# Patient Record
Sex: Female | Born: 1955 | Race: White | Hispanic: No | Marital: Single | Smoking: Never smoker
Health system: Southern US, Community
[De-identification: ages and names within clinical notes are randomized; demographics above are authoritative.]

## PROBLEM LIST (undated history)

## (undated) DIAGNOSIS — F71 Moderate intellectual disabilities: Secondary | ICD-10-CM

## (undated) DIAGNOSIS — I639 Cerebral infarction, unspecified: Secondary | ICD-10-CM

## (undated) DIAGNOSIS — Q909 Down syndrome, unspecified: Secondary | ICD-10-CM

## (undated) DIAGNOSIS — F32A Depression, unspecified: Secondary | ICD-10-CM

## (undated) DIAGNOSIS — F0390 Unspecified dementia without behavioral disturbance: Secondary | ICD-10-CM

## (undated) DIAGNOSIS — E039 Hypothyroidism, unspecified: Secondary | ICD-10-CM

## (undated) DIAGNOSIS — T7840XA Allergy, unspecified, initial encounter: Secondary | ICD-10-CM

## (undated) DIAGNOSIS — I951 Orthostatic hypotension: Secondary | ICD-10-CM

## (undated) DIAGNOSIS — F329 Major depressive disorder, single episode, unspecified: Secondary | ICD-10-CM

## (undated) DIAGNOSIS — E785 Hyperlipidemia, unspecified: Secondary | ICD-10-CM

## (undated) DIAGNOSIS — G4730 Sleep apnea, unspecified: Secondary | ICD-10-CM

## (undated) DIAGNOSIS — R443 Hallucinations, unspecified: Secondary | ICD-10-CM

## (undated) DIAGNOSIS — I739 Peripheral vascular disease, unspecified: Secondary | ICD-10-CM

## (undated) HISTORY — DX: Peripheral vascular disease, unspecified: I73.9

## (undated) HISTORY — DX: Depression, unspecified: F32.A

## (undated) HISTORY — DX: Sleep apnea, unspecified: G47.30

## (undated) HISTORY — DX: Allergy, unspecified, initial encounter: T78.40XA

## (undated) HISTORY — DX: Down syndrome, unspecified: Q90.9

## (undated) HISTORY — DX: Hypothyroidism, unspecified: E03.9

## (undated) HISTORY — DX: Major depressive disorder, single episode, unspecified: F32.9

## (undated) HISTORY — DX: Cerebral infarction, unspecified: I63.9

## (undated) HISTORY — PX: TONSILLECTOMY: SUR1361

## (undated) HISTORY — DX: Moderate intellectual disabilities: F71

## (undated) HISTORY — DX: Hallucinations, unspecified: R44.3

## (undated) HISTORY — DX: Hyperlipidemia, unspecified: E78.5

## (undated) HISTORY — PX: OTHER SURGICAL HISTORY: SHX169

---

## 1997-11-02 ENCOUNTER — Other Ambulatory Visit: Admission: RE | Admit: 1997-11-02 | Discharge: 1997-11-02 | Payer: Self-pay | Admitting: *Deleted

## 1999-03-20 ENCOUNTER — Other Ambulatory Visit: Admission: RE | Admit: 1999-03-20 | Discharge: 1999-03-20 | Payer: Self-pay | Admitting: *Deleted

## 2000-04-08 ENCOUNTER — Other Ambulatory Visit: Admission: RE | Admit: 2000-04-08 | Discharge: 2000-04-08 | Payer: Self-pay | Admitting: *Deleted

## 2000-04-22 ENCOUNTER — Encounter: Admission: RE | Admit: 2000-04-22 | Discharge: 2000-04-22 | Payer: Self-pay | Admitting: *Deleted

## 2000-04-22 ENCOUNTER — Encounter: Payer: Self-pay | Admitting: *Deleted

## 2002-04-26 ENCOUNTER — Encounter: Admission: RE | Admit: 2002-04-26 | Discharge: 2002-06-24 | Payer: Self-pay | Admitting: Family Medicine

## 2002-04-28 ENCOUNTER — Encounter: Admission: RE | Admit: 2002-04-28 | Discharge: 2002-04-28 | Payer: Self-pay

## 2002-06-23 ENCOUNTER — Encounter: Payer: Self-pay | Admitting: Cardiovascular Disease

## 2002-06-23 ENCOUNTER — Ambulatory Visit (HOSPITAL_COMMUNITY): Admission: RE | Admit: 2002-06-23 | Discharge: 2002-06-23 | Payer: Self-pay | Admitting: Neurology

## 2003-07-27 ENCOUNTER — Other Ambulatory Visit: Admission: RE | Admit: 2003-07-27 | Discharge: 2003-07-27 | Payer: Self-pay | Admitting: Family Medicine

## 2006-05-16 ENCOUNTER — Encounter: Admission: RE | Admit: 2006-05-16 | Discharge: 2006-05-16 | Payer: Self-pay | Admitting: Family Medicine

## 2007-02-24 ENCOUNTER — Ambulatory Visit (HOSPITAL_BASED_OUTPATIENT_CLINIC_OR_DEPARTMENT_OTHER): Admission: RE | Admit: 2007-02-24 | Discharge: 2007-02-24 | Payer: Self-pay | Admitting: Neurology

## 2007-04-23 ENCOUNTER — Ambulatory Visit: Payer: Self-pay | Admitting: Internal Medicine

## 2007-07-07 ENCOUNTER — Encounter (INDEPENDENT_AMBULATORY_CARE_PROVIDER_SITE_OTHER): Payer: Self-pay | Admitting: Family Medicine

## 2007-07-07 ENCOUNTER — Ambulatory Visit: Payer: Self-pay

## 2010-10-23 NOTE — Assessment & Plan Note (Signed)
[facility]                            CARDIOLOGY OFFICE NOTE   NAME:Ryser, LAKEA MITTELMAN                    MRN:          [id number]  DATE:04/23/2007                            DOB:          Sep 23, 1955    IDENTIFICATION:  Ms. Chenard is a 55 year old woman who was referred by  Dr. Smith Mince for evaluation of a murmur.   HISTORY OF PRESENT ILLNESS:  The patient has a history of Down syndrome.  She was seen recently in clinic in September and question set up for an  echocardiogram was not done.  Note, she has had an echocardiogram in  2004, at the time of a CVA.  This was a difficult study because of the  patient's size.  LV function appeared to be normal.  There did not  appear to any intracardiac shunt.  RV was reported to be normal in size  and function.  There was mild aortic insufficiency.   On talking to the patient and her mother, she takes activities as  tolerated.  She is not too active.  She has some nasal stuffiness but  that has been stable.   CURRENT MEDICATIONS:  1. Synthroid 75 mcg daily.  2. Omega-3 one gram daily.  3. Multivitamin.  4. Aspirin 81 mg daily.   ALLERGIES:  No known drug allergies.   PAST MEDICAL HISTORY:  1. History of Down syndrome.  2. History of CVA, left frontal.  3. DJD.  4. Obesity.  5. Hypothyroidism.  6. Dyslipidemia, had been on Zocor.  Recent lipids off Statins show an      increase in her lipids with many small particles.  7. Very mild sleep apnea.  8. History of allergies.   SOCIAL HISTORY:  The patient lives at home, does not drink, does not  smoke.  Works at [employer].   FAMILY HISTORY:  Noncontributory.   REVIEW OF SYSTEMS:  All systems reviewed negative to the above problem  except as noted above.   PHYSICAL EXAMINATION:  GENERAL:  On exam the patient is in no distress.  VITAL SIGNS:  Blood pressure is 130/85, pulse is 64, weight 231.  HEENT:  Normocephalic, atraumatic.  Wide set eyes.   Mucous membranes  moist.  LUNGS:  Relatively clear.  No rales.  NECK:  No thyromegaly.  JVP is difficult to see.  No bruits.  CARDIAC:  Regular rate and rhythm.  S1 S2.  Grade 1/6 systolic murmur  heard best at the left sternal border that decreases with standing.  No  diastolic murmur is audible.  ABDOMEN:  Supple, obese, no hepatomegaly.  EXTREMITIES:  No lower extremity edema.  Pulses 2 plus.   A 12-lead EKG shows a normal sinus rhythm, nonspecific ST changes.   IMPRESSION:  1. Murmur.  Very subtle.  She had an echocardiogram in the past that I      tried to retrieve but it is in the archive and will take some time.      No evidence for congestive heart failure on exam either left or      right sided.  I  will discuss with Dr. Smith Mince regarding      scheduling an echocardiogram but would hold for now.  Again,      echocardiogram was done in 2004.  2. Health care maintenance.  I would have the patient go back on a      Statin given her history and her profound dyslipidemia.  I      discussed this with the mother and again will defer to Dr.      Smith Mince.   I have not set a definite followup.  I will be in touch with the patient  after I have spoken with Dr. Smith Mince.     Pricilla Riffle, MD, [facility]  Electronically Signed    PVR/MedQ  DD: 04/23/2007  DT: 04/24/2007  Job #: [id number]   cc:   Talmadge Coventry, M.D.

## 2010-10-23 NOTE — Procedures (Signed)
NAME:  Amanda Castillo, Amanda Castillo             ACCOUNT NO.:  [id number]   MEDICAL RECORD NO.:  [id number]          PATIENT TYPE:  OUT   LOCATION:  SLEEP CENTER                 FACILITY:  [facility]   PHYSICIAN:  Melvyn Novas, M.D.  DATE OF BIRTH:  1955-08-28   DATE OF STUDY:  02/24/2007                            NOCTURNAL POLYSOMNOGRAM   REFERRING PHYSICIAN:   INDICATION FOR STUDY:  Amanda Castillo is a 55 year old female patient of  Dr. Porfirio Mylar Dohmeier.  The polysomnogram took place at [facility].   This patient suffers from excessive daytime sleepiness and endorsed the  Epworth Sleepiness scale at 15 out of 24 possible points.  She is also  concerned about daytime fatigue and observed snoring.   The patient has an enlarged neck size of 18-1/2 inches.  She has a  weight of 216 pounds and a height of 4 feet 7 inches, and her body mass  index therefore exceed 35.  The patient was ordered to have a regular diagnostic polysomnogram.   CURRENT MEDICATIONS:  The patient is taking levothyroxine, fluconazole,  fish oil, aspirin, saline nasal spray, and multivitamins, as well as  Pantenol eye drops.  She took no medications prior to the study in the  night of the documented polysomnogram.   SLEEP ARCHITECTURE:  The patient fell asleep after a prolonged latency  of 46 minutes.  Her REM sleep latency was further prolonged at 247  minutes.  The patient slept for only 79% of the recording time which is  a low sleep efficiency.  Sleep stage I occupied 6% of the total recorded  sleep time, sleep stage NII 84%, and sleep stage NIII 3%.  The patient  therefore had very reduced slow wave sleep.  Seven percent of total  sleep were seen in REM sleep, stage R.  The patient therefore has very  reduced REM sleep as well.   RESPIRATORY EVENTS:  The patient had an apnea/hypopnea index of 7.7 for  the total study.  She had her longest resiratoy events in REM sleep with 31.5 seconds'  duration.  The average length  of a respiratory event was around 16 seconds.  The patient suffered 16 hypopneas in REM sleep and 12 in non-REM sleep.  She had 15 obstructive apneas in REM sleep and 4 in non-REM sleep.   OXYGEN DATA:  Showed a nadir of 77%.  The patient seemed to be a mouth-  breather.  She seemed to have had trouble with her nasal passageway and  also stated that she was congested that night.  She was not noticed to snore loudly.  Snoring level was endorsed at  level 4.   CARDIAC DATA:  The patient remained in a normal sinus rhythm throughout  the study varying between 68 and 85 beats per minute.   MOVEMENT-PARASOMNIA:  Periodic limb movements were seen frequently but  did not lead to a great number of arousals.  The PLM index was therefore  2.3 and is not clinically significant at this level.  The patient had one bathroom break at night.  She does not endorse  nocturia on her intake sheet as one of her sleep problems.  ELECTROENCEPHALOGRAM:  The EEG shows a slight asymmetry with slower EEG  on the right side of the brain.  There were no associated seizures or other abnormalities noted.  ( The patient did suffer a CVA in 2006.)   IMPRESSIONS-RECOMMENDATIONS:  This patient suffers from mild sleep apnea  of an obstructive nature which is accentuated during REM sleep and also  in a supine position.  Given the patient's excessive daytime sleepiness and her nasal  congestion, it would be prudent to first treat her nasal congestion and  see if this alleviates some of her sleep problems.    If not, a return for CPAP could be contemplated but the patient is not  likely to tolerate nasal CPAP well.      Melvyn Novas, M.D.  Diplomate, Biomedical engineer of Sleep  Medicine  Electronically Signed     CD/MEDQ  D:  03/16/2007 12:53:52  T:  03/16/2007 16:19:00  Job:  [id number]

## 2011-02-20 ENCOUNTER — Emergency Department (HOSPITAL_COMMUNITY): Payer: Medicare Other

## 2011-02-20 ENCOUNTER — Inpatient Hospital Stay (HOSPITAL_COMMUNITY)
Admission: EM | Admit: 2011-02-20 | Discharge: 2011-02-25 | DRG: 504 | Disposition: A | Discharge status: Skilled Nursing Facility | Payer: Medicare Other | Attending: Orthopaedic Surgery | Admitting: Orthopaedic Surgery

## 2011-02-20 DIAGNOSIS — Z6841 Body Mass Index (BMI) 40.0 and over, adult: Secondary | ICD-10-CM

## 2011-02-20 DIAGNOSIS — E039 Hypothyroidism, unspecified: Secondary | ICD-10-CM | POA: Diagnosis present

## 2011-02-20 DIAGNOSIS — Z7982 Long term (current) use of aspirin: Secondary | ICD-10-CM

## 2011-02-20 DIAGNOSIS — Q909 Down syndrome, unspecified: Secondary | ICD-10-CM

## 2011-02-20 DIAGNOSIS — I69959 Hemiplegia and hemiparesis following unspecified cerebrovascular disease affecting unspecified side: Secondary | ICD-10-CM

## 2011-02-20 DIAGNOSIS — Y93E1 Activity, personal bathing and showering: Secondary | ICD-10-CM

## 2011-02-20 DIAGNOSIS — W010XXA Fall on same level from slipping, tripping and stumbling without subsequent striking against object, initial encounter: Secondary | ICD-10-CM | POA: Diagnosis present

## 2011-02-20 DIAGNOSIS — Y92009 Unspecified place in unspecified non-institutional (private) residence as the place of occurrence of the external cause: Secondary | ICD-10-CM

## 2011-02-20 DIAGNOSIS — I69920 Aphasia following unspecified cerebrovascular disease: Secondary | ICD-10-CM

## 2011-02-20 DIAGNOSIS — I10 Essential (primary) hypertension: Secondary | ICD-10-CM | POA: Diagnosis present

## 2011-02-20 DIAGNOSIS — E785 Hyperlipidemia, unspecified: Secondary | ICD-10-CM | POA: Diagnosis present

## 2011-02-20 DIAGNOSIS — Z79899 Other long term (current) drug therapy: Secondary | ICD-10-CM

## 2011-02-20 DIAGNOSIS — S92309A Fracture of unspecified metatarsal bone(s), unspecified foot, initial encounter for closed fracture: Principal | ICD-10-CM | POA: Diagnosis present

## 2011-02-20 DIAGNOSIS — G4730 Sleep apnea, unspecified: Secondary | ICD-10-CM | POA: Diagnosis present

## 2011-02-20 DIAGNOSIS — Y998 Other external cause status: Secondary | ICD-10-CM

## 2011-02-21 LAB — SURGICAL PCR SCREEN
MRSA, PCR: INVALID — AB
Staphylococcus aureus: INVALID — AB

## 2011-02-22 LAB — URINALYSIS, ROUTINE W REFLEX MICROSCOPIC
Bilirubin Urine: NEGATIVE
Nitrite: NEGATIVE
Specific Gravity, Urine: 1.011 (ref 1.005–1.030)
pH: 5.5 (ref 5.0–8.0)

## 2011-02-24 LAB — MRSA CULTURE

## 2011-03-28 ENCOUNTER — Other Ambulatory Visit: Payer: Self-pay | Admitting: Internal Medicine

## 2012-05-20 ENCOUNTER — Encounter: Payer: Self-pay | Admitting: Gastroenterology

## 2012-06-02 ENCOUNTER — Ambulatory Visit (AMBULATORY_SURGERY_CENTER): Payer: Medicare Other

## 2012-06-02 VITALS — Ht 60.0 in | Wt 187.8 lb

## 2012-06-02 DIAGNOSIS — Z1211 Encounter for screening for malignant neoplasm of colon: Secondary | ICD-10-CM

## 2012-06-02 MED ORDER — MOVIPREP 100 G PO SOLR: ORAL | Status: DC

## 2012-06-02 NOTE — Progress Notes (Signed)
The patient and Nellie Interior and spatial designer) from the [facility] group home came into the office for the pre-visit prior to the colonoscopy on 06/12/12 with Dr Jarold Motto.The pt did have some difficulty answering some medical questions regarding surgery, but was able to read and write. A manager from the group home will be present at her colonoscopy appt to assist with medical questions or any other questions.

## 2012-06-09 ENCOUNTER — Telehealth: Payer: Self-pay | Admitting: Gastroenterology

## 2012-06-09 NOTE — Telephone Encounter (Signed)
Spoke with Beth at [facility] group home and informed her we had given Nellie (Museum/gallery curator) a voucher for a free Moviprep for the patient to pick-up at [facility]. She will check with the assistant and call us back if there are further questions or problems.

## 2012-06-12 ENCOUNTER — Ambulatory Visit (AMBULATORY_SURGERY_CENTER): Payer: Medicare Other | Admitting: Gastroenterology

## 2012-06-12 ENCOUNTER — Encounter: Payer: Self-pay | Admitting: Gastroenterology

## 2012-06-12 VITALS — BP 123/61 | HR 51 | Temp 96.6°F | Resp 14 | Ht 60.0 in | Wt 187.0 lb

## 2012-06-12 DIAGNOSIS — D126 Benign neoplasm of colon, unspecified: Secondary | ICD-10-CM

## 2012-06-12 DIAGNOSIS — K635 Polyp of colon: Secondary | ICD-10-CM

## 2012-06-12 DIAGNOSIS — Z1211 Encounter for screening for malignant neoplasm of colon: Secondary | ICD-10-CM

## 2012-06-12 HISTORY — PX: COLONOSCOPY: SHX174

## 2012-06-12 MED ORDER — SODIUM CHLORIDE 0.9 % IV SOLN: 500.0000 mL | INTRAVENOUS | Status: DC

## 2012-06-12 NOTE — Patient Instructions (Addendum)
YOU HAD AN ENDOSCOPIC PROCEDURE TODAY AT THE [facility]: Refer to the procedure report that was given to you for any specific questions about what was found during the examination.  If the procedure report does not answer your questions, please call your gastroenterologist to clarify.  If you requested that your care partner not be given the details of your procedure findings, then the procedure report has been included in a sealed envelope for you to review at your convenience later.  YOU SHOULD EXPECT: Some feelings of bloating in the abdomen. Passage of more gas than usual.  Walking can help get rid of the air that was put into your GI tract during the procedure and reduce the bloating. If you had a lower endoscopy (such as a colonoscopy or flexible sigmoidoscopy) you may notice spotting of blood in your stool or on the toilet paper. If you underwent a bowel prep for your procedure, then you may not have a normal bowel movement for a few days.  DIET: Your first meal following the procedure should be a light meal and then it is ok to progress to your normal diet.  A half-sandwich or bowl of soup is an example of a good first meal.  Heavy or fried foods are harder to digest and may make you feel nauseous or bloated.  Likewise meals heavy in dairy and vegetables can cause extra gas to form and this can also increase the bloating.  Drink plenty of fluids but you should avoid alcoholic beverages for 24 hours.  ACTIVITY: Your care partner should take you home directly after the procedure.  You should plan to take it easy, moving slowly for the rest of the day.  You can resume normal activity the day after the procedure however you should NOT DRIVE or use heavy machinery for 24 hours (because of the sedation medicines used during the test).    SYMPTOMS TO REPORT IMMEDIATELY: A gastroenterologist can be reached at any hour.  During normal business hours, 8:30 AM to 5:00 PM Monday through Friday,  call [phone].  After hours and on weekends, please call the GI answering service at [phone] who will take a message and have the physician on call contact you.   Following lower endoscopy (colonoscopy or flexible sigmoidoscopy):  Excessive amounts of blood in the stool  Significant tenderness or worsening of abdominal pains  Swelling of the abdomen that is new, acute  Fever of 100F or higher   FOLLOW UP: If any biopsies were taken you will be contacted by phone or by letter within the next 1-3 weeks.  Call your gastroenterologist if you have not heard about the biopsies in 3 weeks.  Our staff will call the home number listed on your records the next business day following your procedure to check on you and address any questions or concerns that you may have at that time regarding the information given to you following your procedure. This is a courtesy call and so if there is no answer at the home number and we have not heard from you through the emergency physician on call, we will assume that you have returned to your regular daily activities without incident.  SIGNATURES/CONFIDENTIALITY: You and/or your care partner have signed paperwork which will be entered into your electronic medical record.  These signatures attest to the fact that that the information above on your After Visit Summary has been reviewed and is understood.  Full responsibility of the confidentiality of   this discharge information lies with you and/or your care-partner.   Resume medications. Information on polyps given with discharge instructions. 

## 2012-06-12 NOTE — Progress Notes (Addendum)
Dr. Jarold Motto in to recovery informed nurse that he called pt. Legal guardian Marylu Lund and informed of results of procedure,doctor also gave permission for pt. To go to gathering this evening.pt. Breathing stable,but unable to obtain oxygen sat. With pulse ox.Able to obtain oxygen level after sitting pt. Upright. Range 97-99%.

## 2012-06-12 NOTE — Progress Notes (Signed)
Patient admitted to [facility] with assistant from her facility. Questioned caregiver if patient was able to sign for herself or if she had a guardian of POA. Caregiver called Mrs. Battle,supervisor of patient's home. Patient's sister is the guardian. Call placed to the guardian Denver Harder, [phone]. Reviewed consent with Mrs Whitlatch, who verbalized understanding and consent. Witness obtained.  Medicals reviewed per telephone with sister. Sister requesting for the patient to go to the [employer] [phone] tonight. This was cleared with Dr. Jarold Motto, who gave permission for patient to attend this event.

## 2012-06-12 NOTE — Progress Notes (Signed)
Patient did not experience any of the following events: a burn prior to discharge; a fall within the facility; wrong site/side/patient/procedure/implant event; or a hospital transfer or hospital admission upon discharge from the facility. (G8907) Patient did not have preoperative order for IV antibiotic SSI prophylaxis. (G8918)  

## 2012-06-12 NOTE — Op Note (Signed)
[facility] [address] [place], [address]   COLONOSCOPY PROCEDURE REPORT  PATIENT: Amanda Castillo, Amanda Castillo  MR#: [id number] BIRTHDATE: [birth year] , 56  yrs. old GENDER: Female ENDOSCOPIST: Mardella Layman, MD, Clementeen Graham REFERRED BY:  Renford Dills, M.D. PROCEDURE DATE:  06/12/2012 PROCEDURE:   Colonoscopy with biopsy ASA CLASS:   Class II INDICATIONS:Average risk patient for colon cancer. MEDICATIONS: Propofol (Diprivan) 120 mg IV  DESCRIPTION OF PROCEDURE:   After the risks and benefits and of the procedure were explained, informed consent was obtained.  A digital rectal exam revealed no abnormalities of the rectum.    The Fuse-Demo-Scope  endoscope was introduced through the anus and advanced to the cecum, which was identified by both the appendix and ileocecal valve .  The quality of the prep was excellent, using MoviPrep .  The instrument was then slowly withdrawn as the colon was fully examined.     COLON FINDINGS: Images taken but only available in hard copy form that will be scanned into EPIC Agricultural engineer).   A diminutive smooth flat polyp was found in the rectum.  A biopsy was performed using cold forceps.   A normal appearing cecum, ileocecal valve, and appendiceal orifice were identified.  The ascending, hepatic flexure, transverse, splenic flexure, descending, sigmoid colon and rectum appeared unremarkable.  No polyps or cancers were seen. Retroflexed views revealed no abnormalities.     The scope was then withdrawn from the patient and the procedure completed.  COMPLICATIONS: There were no complications. ENDOSCOPIC IMPRESSION: 1.   Images taken but only available in hard copy form that will be scanned into EPIC [employer]). 2.   Diminutive flat polyp was found in the rectum; biopsy was performed using cold forceps 3.   Normal colon  RECOMMENDATIONS: 1.  Repeat colonoscopy in 5 years if polyp adenomatous; otherwise 10 years 2.  Continue current  medications   REPEAT EXAM:  cc:  _______________________________ eSignedMardella Layman, MD, [facility] 06/12/2012 11:15 AM

## 2012-06-15 ENCOUNTER — Telehealth: Payer: Self-pay | Admitting: *Deleted

## 2012-06-15 ENCOUNTER — Encounter: Payer: Self-pay | Admitting: Gastroenterology

## 2012-06-15 NOTE — Telephone Encounter (Signed)
  Follow up Call-  Call back number 06/12/2012  Post procedure Call Back phone  # [phone]- Amanda Castillo  Permission to leave phone message Yes     Patient questions:  Do you have a fever, pain , or abdominal swelling? no Pain Score  0 *  Have you tolerated food without any problems? yes  Have you been able to return to your normal activities? yes  Do you have any questions about your discharge instructions: Diet   no Medications  no Follow up visit  no  Do you have questions or concerns about your Care? no  Actions: * If pain score is 4 or above: No action needed, pain <4.

## 2012-06-16 ENCOUNTER — Encounter: Payer: Self-pay | Admitting: Gastroenterology

## 2012-07-12 ENCOUNTER — Ambulatory Visit (HOSPITAL_BASED_OUTPATIENT_CLINIC_OR_DEPARTMENT_OTHER): Payer: Medicare Other | Attending: Specialist | Admitting: Sleep Medicine

## 2012-07-12 VITALS — Ht <= 58 in | Wt 216.0 lb

## 2012-07-12 DIAGNOSIS — G4710 Hypersomnia, unspecified: Secondary | ICD-10-CM | POA: Insufficient documentation

## 2012-07-12 DIAGNOSIS — G4700 Insomnia, unspecified: Secondary | ICD-10-CM

## 2012-07-18 DIAGNOSIS — G4730 Sleep apnea, unspecified: Secondary | ICD-10-CM

## 2012-07-18 DIAGNOSIS — G4710 Hypersomnia, unspecified: Secondary | ICD-10-CM

## 2012-07-18 NOTE — Procedures (Signed)
NAME:  [name], [name]             ACCOUNT NO.:  [id number]  MEDICAL RECORD NO.:  [id number]          PATIENT TYPE:  OUT  LOCATION:  SLEEP CENTER                 FACILITY:  [facility]  PHYSICIAN:  Clinton D. Maple Hudson, MD, FCCP, FACPDATE OF BIRTH:  1955-06-21  DATE OF STUDY:  07/12/2012                           NOCTURNAL POLYSOMNOGRAM  REFERRING PHYSICIAN:  RICHARD PAVELOCK  INDICATION FOR STUDY:  Hypersomnia with sleep apnea.  EPWORTH SLEEPINESS SCORE:  8/24.  BMI 50, weight 216 pounds, height 55 inches, neck 16.5 inches.  MEDICATIONS:  Home medications charted and reviewed.  This is a special needs.  The patient and her caregiver stayed with her.  A baseline diagnostic NPSG on February 24, 2007, had recorded an AHI of 7.7 per hour.  Body weight was 216 pounds.  CPAP titration is now requested.  SLEEP ARCHITECTURE:  Total sleep time 175 minutes with sleep efficiency 46.1%.  Stage I was 20.9%, stage II 41.1%, stage III 6.6%, REM 31.4% of total sleep time.  Sleep latency 58.5 minutes, REM latency 125.5 minutes, awake after sleep onset 146.5 minutes.  Arousal index 30.9. Bedtime medication:  None.  Sleep onset was at approximately 1:15 a.m. and subsequent sleep was markedly fragmented with frequent brief awakenings and sleep.  RESPIRATORY DATA:  CPAP titration protocol.  The study was difficult for the technician because of the patient's lack of sustained sleep.  CPAP was titrated from 4 to 10 CWP, and then bilevel titration was tried, seeking better control.  Satisfactory control was never obtained.  Best response actually was at CPAP 7, associated with an AHI of 10.3 per hour.  She wore a petite Fisher and Paykel nasal Zest mask with heated humidifier and an EPR of 1.  OXYGEN DATA:  Snoring was prevented by CPAP with oxygen saturation holding at a mean of 95.1% on room air.  CARDIAC DATA:  Sinus rhythm.  MOVEMENT-PARASOMNIA:  A few limb jerks were noted with  insignificant impact on sleep.  IMPRESSIONS-RECOMMENDATIONS: 1. The study was technically difficult.  It was understood that the     patient would not tolerate a full-face mask and a nasal mask was     used.  Sleep onset was delayed until approximately 1:15 a.m. and     sleep was fragmented through the rest of the night. 2. CPAP titration with incomplete control.  Best pressure appears to     be CPAP of 7 associated with AHI of 10.3 per hour.  Bilevel     titration was also tried.  It may very well be that the patient     would do best with auto-PAP with a pressure range between 5 and 15     CWP.  She wore a petite Fisher and Paykel nasal Zest mask with     heated humidifier and an EPR of 1. 3. A baseline diagnostic NPSG on February 24, 2007, had recorded AHI     7.7 per hour.  Body weight was 216 pounds for that study.     Clinton D. Maple Hudson, MD, [facility], FACP Diplomate, American Board of Sleep Medicine    CDY/MEDQ  D:  07/18/2012 09:59:22  T:  07/18/2012 11:35:34  Job:  [id number] 

## 2012-07-29 ENCOUNTER — Encounter: Payer: Self-pay | Admitting: Gastroenterology

## 2013-05-11 ENCOUNTER — Emergency Department (HOSPITAL_BASED_OUTPATIENT_CLINIC_OR_DEPARTMENT_OTHER)
Admission: EM | Admit: 2013-05-11 | Discharge: 2013-05-11 | Disposition: A | Discharge status: Home / Self Care | Payer: Medicare Other | Attending: Emergency Medicine | Admitting: Emergency Medicine

## 2013-05-11 ENCOUNTER — Emergency Department (HOSPITAL_BASED_OUTPATIENT_CLINIC_OR_DEPARTMENT_OTHER): Payer: Medicare Other

## 2013-05-11 ENCOUNTER — Encounter (HOSPITAL_BASED_OUTPATIENT_CLINIC_OR_DEPARTMENT_OTHER): Payer: Self-pay | Admitting: Emergency Medicine

## 2013-05-11 DIAGNOSIS — Y939 Activity, unspecified: Secondary | ICD-10-CM | POA: Insufficient documentation

## 2013-05-11 DIAGNOSIS — S59909A Unspecified injury of unspecified elbow, initial encounter: Secondary | ICD-10-CM | POA: Insufficient documentation

## 2013-05-11 DIAGNOSIS — W19XXXA Unspecified fall, initial encounter: Secondary | ICD-10-CM

## 2013-05-11 DIAGNOSIS — Z79899 Other long term (current) drug therapy: Secondary | ICD-10-CM | POA: Insufficient documentation

## 2013-05-11 DIAGNOSIS — S6990XA Unspecified injury of unspecified wrist, hand and finger(s), initial encounter: Secondary | ICD-10-CM | POA: Insufficient documentation

## 2013-05-11 DIAGNOSIS — Q909 Down syndrome, unspecified: Secondary | ICD-10-CM | POA: Insufficient documentation

## 2013-05-11 DIAGNOSIS — E785 Hyperlipidemia, unspecified: Secondary | ICD-10-CM | POA: Insufficient documentation

## 2013-05-11 DIAGNOSIS — E079 Disorder of thyroid, unspecified: Secondary | ICD-10-CM | POA: Insufficient documentation

## 2013-05-11 DIAGNOSIS — F329 Major depressive disorder, single episode, unspecified: Secondary | ICD-10-CM | POA: Insufficient documentation

## 2013-05-11 DIAGNOSIS — R296 Repeated falls: Secondary | ICD-10-CM | POA: Insufficient documentation

## 2013-05-11 DIAGNOSIS — Z7982 Long term (current) use of aspirin: Secondary | ICD-10-CM | POA: Insufficient documentation

## 2013-05-11 DIAGNOSIS — Z8673 Personal history of transient ischemic attack (TIA), and cerebral infarction without residual deficits: Secondary | ICD-10-CM | POA: Insufficient documentation

## 2013-05-11 DIAGNOSIS — Y9210 Unspecified residential institution as the place of occurrence of the external cause: Secondary | ICD-10-CM | POA: Insufficient documentation

## 2013-05-11 DIAGNOSIS — F3289 Other specified depressive episodes: Secondary | ICD-10-CM | POA: Insufficient documentation

## 2013-05-11 NOTE — ED Provider Notes (Signed)
CSN: [id number]     Arrival date & time 05/11/13  4098 History   First MD Initiated Contact with Patient 05/11/13 0957     Chief Complaint  Patient presents with  . wrist pain after fall several hours ago at group home    (Consider location/radiation/quality/duration/timing/severity/associated sxs/prior Treatment) HPI Level 5 caveat due to mental status Pt with Down Syndrome poor historian, brought from local group home after reported fall this AM. She was apparently complaining of L wrist pain to staff there and so they called EMS. Pt's complaints are inconsistent at best. Denies wrist pain to me. Initially complained of L foot pain but then denies foot pain a couple of minutes later.   Past Medical History  Diagnosis Date  . Hyperlipidemia   . Allergy   . Thyroid disease   . Depression   . Down's syndrome   . Stroke     in 2006  . PVD (peripheral vascular disease)   . Moderate intellectual disability   . Hallucinations    Past Surgical History  Procedure Laterality Date  . Orif lt foot    . Tonsillectomy     Family History  Problem Relation Age of Onset  . Colon cancer Paternal Uncle   . Colon cancer Maternal Grandfather    History  Substance Use Topics  . Smoking status: Never Smoker   . Smokeless tobacco: Never Used  . Alcohol Use: No   OB History   Grav Para Term Preterm Abortions TAB SAB Ect Mult Living                 Review of Systems Unable to assess due to mental status.   Allergies  Review of patient's allergies indicates no known allergies.  Home Medications   Current Outpatient Rx  Name  Route  Sig  Dispense  Refill  . aspirin 81 MG tablet   Oral   Take 81 mg by mouth daily.         . calcium carbonate (TUMS - DOSED IN MG ELEMENTAL CALCIUM) 500 MG chewable tablet   Oral   Chew 1 tablet by mouth daily.         . cetaphil (CETAPHIL) lotion   Topical   Apply topically as needed.         . cholecalciferol (VITAMIN D) 400 UNITS TABS  Oral   Take by mouth daily.         . Emollient (LUBRIDERM SERIOUSLY SENSITIVE) LOTN   Topical   Apply topically 2 (two) times daily.         Marland Kitchen erythromycin (PCE) 500 MG EC tablet   Oral   Take 500 mg by mouth daily.         Marland Kitchen escitalopram (LEXAPRO) 10 MG tablet   Oral   Take 10 mg by mouth daily.         Marland Kitchen levothyroxine (SYNTHROID, LEVOTHROID) 75 MCG tablet   Oral   Take 75 mcg by mouth daily.         Marland Kitchen loratadine (CLARITIN) 10 MG tablet   Oral   Take 10 mg by mouth daily.         Marland Kitchen omega-3 acid ethyl esters (LOVAZA) 1 G capsule   Oral   Take 2 g by mouth daily.         . Oxymetazoline HCl (NASAL SPRAY NA)   Nasal   Place into the nose. Deep Sea Nasal Spray         .  simvastatin (ZOCOR) 40 MG tablet   Oral   Take 40 mg by mouth every evening.          BP 154/56  Pulse 50  Temp(Src) 98.3 F (36.8 C) (Oral)  Resp 18  SpO2 100% Physical Exam  Nursing note and vitals reviewed. Constitutional: She appears well-developed and well-nourished.  HENT:  Head: Normocephalic and atraumatic.  Eyes: EOM are normal. Pupils are equal, round, and reactive to light.  Neck: Normal range of motion. Neck supple.  Cardiovascular: Normal rate, normal heart sounds and intact distal pulses.   Pulmonary/Chest: Effort normal and breath sounds normal.  Abdominal: Bowel sounds are normal. She exhibits no distension. There is no tenderness.  Musculoskeletal: Normal range of motion. She exhibits no edema and no tenderness.  No external signs of injury  Neurological: She is alert. She has normal strength. No cranial nerve deficit or sensory deficit.  Skin: Skin is warm and dry. No rash noted.  Psychiatric: She has a normal mood and affect.    ED Course  Procedures (including critical care time) Labs Review Labs Reviewed - No data to display Imaging Review Dg Wrist Complete Right  05/11/2013   CLINICAL DATA:  Wrist pain  EXAM: RIGHT WRIST - COMPLETE 3+ VIEW   COMPARISON:  None.  FINDINGS: Four views of the right wrist submitted. No acute fracture or subluxation. Joint space is preserved. No radiopaque foreign body.  IMPRESSION: Negative.   Electronically Signed   By: Natasha Mead M.D.   On: 05/11/2013 10:16    EKG Interpretation   None       MDM   1. Fall, initial encounter     Pt sent for xray of wrist given presenting complaints but now states her wrist does not hurt. No focal tenderness, swelling, deformity or ecchymosis of L foot. Had remote injury there but no signs of acute injury or fracture.    Charles B. Bernette Mayers, MD 05/11/13 [phone]

## 2013-05-11 NOTE — ED Notes (Signed)
Group home [address] [place]  ICR MR [facility]

## 2013-05-11 NOTE — ED Notes (Signed)
Patient transported to & from X-ray. 

## 2013-08-19 ENCOUNTER — Encounter: Payer: Self-pay | Admitting: Internal Medicine

## 2013-09-24 ENCOUNTER — Encounter: Payer: Self-pay | Admitting: Gastroenterology

## 2013-10-12 ENCOUNTER — Encounter: Payer: Self-pay | Admitting: Internal Medicine

## 2013-10-12 ENCOUNTER — Ambulatory Visit (INDEPENDENT_AMBULATORY_CARE_PROVIDER_SITE_OTHER): Payer: Medicare Other | Admitting: Internal Medicine

## 2013-10-12 VITALS — BP 106/72 | HR 64 | Ht 63.0 in | Wt 161.0 lb

## 2013-10-12 DIAGNOSIS — R1310 Dysphagia, unspecified: Secondary | ICD-10-CM

## 2013-10-12 NOTE — Progress Notes (Signed)
         Subjective:    Patient ID: Amanda Castillo, female    DOB: 1956/05/26, 58 y.o.   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 Here w/ sister and group home care giver Staff have ?ed if she has dysphagia as she seems to [place] with eating at times. Sister unaware of any prior diagnosis of rumination syndrome which is on PMHx - referral reports "acid rumination".  Medications, allergies, past medical history, past surgical history, family history and social history are reviewed and updated in the EMR.   Review of Systems Ambulated with walker    Objective:   Physical Exam Down's patient  Middle aged NAD Missing many teeth, oral and posterior pharynx are clear Neck supple, no masses or thyromegaly    Assessment & Plan:  Dysphagia, unspecified(787.20) - Plan: DG Esophagus   Further plans pending barium swallow.  BZ:JIRCVELF, Delfino Lovett, MD

## 2013-10-12 NOTE — Patient Instructions (Signed)
You have been scheduled for a Barium Esophogram at [facility] (1st floor of the hospital) on 10/20/13 at 11:00am. Please arrive 15 minutes prior to your appointment for registration. Make certain not to have anything to eat or drink 3 hours prior to your test. If you need to reschedule for any reason, please contact radiology at [phone] to do so. __________________________________________________________________ A barium swallow is an examination that concentrates on views of the esophagus. This tends to be a double contrast exam (barium and two liquids which, when combined, create a gas to distend the wall of the oesophagus) or single contrast (non-ionic iodine based). The study is usually tailored to your symptoms so a good history is essential. Attention is paid during the study to the form, structure and configuration of the esophagus, looking for functional disorders (such as aspiration, dysphagia, achalasia, motility and reflux) EXAMINATION You may be asked to change into a gown, depending on the type of swallow being performed. A radiologist and radiographer will perform the procedure. The radiologist will advise you of the type of contrast selected for your procedure and direct you during the exam. You will be asked to stand, sit or lie in several different positions and to hold a small amount of fluid in your mouth before being asked to swallow while the imaging is performed .In some instances you may be asked to swallow barium coated marshmallows to assess the motility of a solid food bolus. The exam can be recorded as a digital or video fluoroscopy procedure. POST PROCEDURE It will take 1-2 days for the barium to pass through your system. To facilitate this, it is important, unless otherwise directed, to increase your fluids for the next 24-48hrs and to resume your normal diet.  This test typically takes about 30 minutes to  perform. __________________________________________________________________________________  I appreciate the opportunity to care for you.

## 2013-10-13 ENCOUNTER — Encounter: Payer: Self-pay | Admitting: Internal Medicine

## 2013-10-20 ENCOUNTER — Ambulatory Visit (HOSPITAL_COMMUNITY)
Admission: RE | Admit: 2013-10-20 | Discharge: 2013-10-20 | Disposition: A | Discharge status: Home / Self Care | Payer: Medicare Other | Source: Ambulatory Visit | Attending: Internal Medicine | Admitting: Internal Medicine

## 2013-10-20 DIAGNOSIS — K224 Dyskinesia of esophagus: Secondary | ICD-10-CM | POA: Insufficient documentation

## 2013-10-20 DIAGNOSIS — R1310 Dysphagia, unspecified: Secondary | ICD-10-CM | POA: Insufficient documentation

## 2014-02-22 ENCOUNTER — Encounter (HOSPITAL_COMMUNITY): Payer: Self-pay | Admitting: Emergency Medicine

## 2014-02-22 ENCOUNTER — Emergency Department (HOSPITAL_COMMUNITY): Payer: Medicare Other

## 2014-02-22 ENCOUNTER — Emergency Department (HOSPITAL_COMMUNITY)
Admission: EM | Admit: 2014-02-22 | Discharge: 2014-02-22 | Disposition: A | Discharge status: Home / Self Care | Payer: Medicare Other | Attending: Emergency Medicine | Admitting: Emergency Medicine

## 2014-02-22 DIAGNOSIS — R259 Unspecified abnormal involuntary movements: Secondary | ICD-10-CM | POA: Diagnosis not present

## 2014-02-22 DIAGNOSIS — E785 Hyperlipidemia, unspecified: Secondary | ICD-10-CM | POA: Diagnosis not present

## 2014-02-22 DIAGNOSIS — F329 Major depressive disorder, single episode, unspecified: Secondary | ICD-10-CM | POA: Diagnosis not present

## 2014-02-22 DIAGNOSIS — R569 Unspecified convulsions: Secondary | ICD-10-CM | POA: Insufficient documentation

## 2014-02-22 DIAGNOSIS — E039 Hypothyroidism, unspecified: Secondary | ICD-10-CM | POA: Diagnosis not present

## 2014-02-22 DIAGNOSIS — X58XXXA Exposure to other specified factors, initial encounter: Secondary | ICD-10-CM | POA: Insufficient documentation

## 2014-02-22 DIAGNOSIS — R51 Headache: Secondary | ICD-10-CM | POA: Diagnosis not present

## 2014-02-22 DIAGNOSIS — IMO0002 Reserved for concepts with insufficient information to code with codable children: Secondary | ICD-10-CM | POA: Insufficient documentation

## 2014-02-22 DIAGNOSIS — Z8673 Personal history of transient ischemic attack (TIA), and cerebral infarction without residual deficits: Secondary | ICD-10-CM | POA: Insufficient documentation

## 2014-02-22 DIAGNOSIS — I739 Peripheral vascular disease, unspecified: Secondary | ICD-10-CM | POA: Diagnosis not present

## 2014-02-22 DIAGNOSIS — F3289 Other specified depressive episodes: Secondary | ICD-10-CM | POA: Insufficient documentation

## 2014-02-22 DIAGNOSIS — Z792 Long term (current) use of antibiotics: Secondary | ICD-10-CM | POA: Diagnosis not present

## 2014-02-22 DIAGNOSIS — Y929 Unspecified place or not applicable: Secondary | ICD-10-CM | POA: Diagnosis not present

## 2014-02-22 DIAGNOSIS — Z7982 Long term (current) use of aspirin: Secondary | ICD-10-CM | POA: Insufficient documentation

## 2014-02-22 DIAGNOSIS — Y939 Activity, unspecified: Secondary | ICD-10-CM | POA: Insufficient documentation

## 2014-02-22 DIAGNOSIS — Z79899 Other long term (current) drug therapy: Secondary | ICD-10-CM | POA: Diagnosis not present

## 2014-02-22 DIAGNOSIS — R251 Tremor, unspecified: Secondary | ICD-10-CM

## 2014-02-22 DIAGNOSIS — Q909 Down syndrome, unspecified: Secondary | ICD-10-CM | POA: Insufficient documentation

## 2014-02-22 LAB — URINALYSIS, ROUTINE W REFLEX MICROSCOPIC
GLUCOSE, UA: NEGATIVE mg/dL
HGB URINE DIPSTICK: NEGATIVE
Ketones, ur: 15 mg/dL — AB
Nitrite: NEGATIVE
Protein, ur: NEGATIVE mg/dL
SPECIFIC GRAVITY, URINE: 1.023 (ref 1.005–1.030)
Urobilinogen, UA: 1 mg/dL (ref 0.0–1.0)
pH: 6 (ref 5.0–8.0)

## 2014-02-22 LAB — COMPREHENSIVE METABOLIC PANEL
ALT: 18 U/L (ref 0–35)
AST: 20 U/L (ref 0–37)
Albumin: 3.8 g/dL (ref 3.5–5.2)
Alkaline Phosphatase: 66 U/L (ref 39–117)
Anion gap: 14 (ref 5–15)
BUN: 14 mg/dL (ref 6–23)
CALCIUM: 9.4 mg/dL (ref 8.4–10.5)
CO2: 25 meq/L (ref 19–32)
Chloride: 105 mEq/L (ref 96–112)
Creatinine, Ser: 1.02 mg/dL (ref 0.50–1.10)
GFR calc Af Amer: 70 mL/min — ABNORMAL LOW (ref >90)
GFR calc non Af Amer: 60 mL/min — ABNORMAL LOW (ref >90)
Glucose, Bld: 92 mg/dL (ref 70–99)
POTASSIUM: 3.9 meq/L (ref 3.7–5.3)
SODIUM: 144 meq/L (ref 137–147)
TOTAL PROTEIN: 7.2 g/dL (ref 6.0–8.3)
Total Bilirubin: 0.6 mg/dL (ref 0.3–1.2)

## 2014-02-22 LAB — CBC
HCT: 41.4 % (ref 36.0–46.0)
HEMOGLOBIN: 14 g/dL (ref 12.0–15.0)
MCH: 31.3 pg (ref 26.0–34.0)
MCHC: 33.8 g/dL (ref 30.0–36.0)
MCV: 92.4 fL (ref 78.0–100.0)
Platelets: 169 10*3/uL (ref 150–400)
RBC: 4.48 MIL/uL (ref 3.87–5.11)
RDW: 14.3 % (ref 11.5–15.5)
WBC: 13.2 10*3/uL — ABNORMAL HIGH (ref 4.0–10.5)

## 2014-02-22 LAB — URINE MICROSCOPIC-ADD ON

## 2014-02-22 LAB — CBG MONITORING, ED: GLUCOSE-CAPILLARY: 81 mg/dL (ref 70–99)

## 2014-02-22 MED ORDER — ACETAMINOPHEN 325 MG PO TABS
650.0000 mg | ORAL_TABLET | Freq: Once | ORAL | Status: AC
  Administered 2014-02-22: 650 mg via ORAL
  Filled 2014-02-22: qty 2

## 2014-02-22 NOTE — ED Notes (Signed)
Family at bedside. 

## 2014-02-22 NOTE — ED Notes (Signed)
Per [facility], pt from group home in [place] for questionable seizure. No hx. Staff had her on the commode and noticed she had shaking of her hands and feet and was nonverbal during for about 1 min with 2 back to back. 20g to [facility]. NSR. Pt does have a small lac to inside of left lip. Reports being tired and has a slight HA.

## 2014-02-22 NOTE — Discharge Instructions (Signed)

## 2014-02-22 NOTE — ED Provider Notes (Signed)
CSN: [id number]     Arrival date & time 02/22/14  4098 History   First MD Initiated Contact with Patient 02/22/14 [phone]     Chief Complaint  Patient presents with  . Seizures     (Consider location/radiation/quality/duration/timing/severity/associated sxs/prior Treatment) HPI 58 year old female with a history of Down syndrome and mental retardation presents with a possible first-time seizure. The history taken from EMS as the patient is unable to provide a history. EMS states that they were told by her facility that while she was standing she developed shaking and both arms and both legs. They proceeded to sit the patient down on the toilet and she was shaking for about 1 minute. They noted she was nonverbal during this time. She then stop shaking for an unknown amount of time and had a repeat episode of about a minute. Unknown if she was incontinent. She has a mild abrasion to her inner lip that appears to be new per the facility. She has no known prior history of seizures per the aide at the bedside. Patient is complaining of a mild headache. The aide at the bedside states that the patient is acting her normal self at this time. She has not been ill recently. No fevers. Glucose normal per EMS.  Past Medical History  Diagnosis Date  . Hyperlipidemia   . Allergy   . Hypothyroidism   . Depression   . Down's syndrome   . Stroke     in 2006  . PVD (peripheral vascular disease)   . Moderate intellectual disability   . Hallucinations   . Sleep apnea    Past Surgical History  Procedure Laterality Date  . Orif lt foot    . Tonsillectomy    . Colonoscopy  06/12/2012   Family History  Problem Relation Age of Onset  . Colon cancer Paternal Uncle   . Colon cancer Maternal Grandfather    History  Substance Use Topics  . Smoking status: Never Smoker   . Smokeless tobacco: Never Used  . Alcohol Use: No   OB History   Grav Para Term Preterm Abortions TAB SAB Ect Mult Living                 Review of Systems  Unable to perform ROS: Other      Allergies  Review of patient's allergies indicates no known allergies.  Home Medications   Prior to Admission medications   Medication Sig Start Date End Date Taking? Authorizing Provider  aspirin 81 MG tablet Take 81 mg by mouth daily.    Historical Provider, MD  betamethasone dipropionate (DIPROLENE) 0.05 % cream Apply topically 2 (two) times daily.    Historical Provider, MD  calcium carbonate (TUMS - DOSED IN MG ELEMENTAL CALCIUM) 500 MG chewable tablet Chew 1 tablet by mouth daily.    Historical Provider, MD  cetaphil (CETAPHIL) lotion Apply topically as needed.    Historical Provider, MD  cholecalciferol (VITAMIN D) 400 UNITS TABS Take by mouth daily.    Historical Provider, MD  Emollient (LUBRIDERM SERIOUSLY SENSITIVE) LOTN Apply topically 2 (two) times daily.    Historical Provider, MD  Erythromycin 2 % ointment Apply topically 2 (two) times daily.    Historical Provider, MD  escitalopram (LEXAPRO) 10 MG tablet Take 10 mg by mouth daily.    Historical Provider, MD  levothyroxine (SYNTHROID, LEVOTHROID) 75 MCG tablet Take 75 mcg by mouth daily.    Historical Provider, MD  loratadine (CLARITIN) 10 MG tablet Take 10  mg by mouth daily.    Historical Provider, MD  losartan (COZAAR) 25 MG tablet Take 25 mg by mouth daily.    Historical Provider, MD  omega-3 acid ethyl esters (LOVAZA) 1 G capsule Take 2 g by mouth daily.    Historical Provider, MD  omeprazole (PRILOSEC) 20 MG capsule Take 20 mg by mouth daily.    Historical Provider, MD  Oxymetazoline HCl (NASAL SPRAY NA) Place into the nose. Deep Sea Nasal Spray    Historical Provider, MD  Saline (DEEP SEA NASAL SPRAY NA) Place 2 sprays into the nose at bedtime.    Historical Provider, MD  simvastatin (ZOCOR) 40 MG tablet Take 40 mg by mouth every evening.    Historical Provider, MD   BP 112/99  Temp(Src) 97.6 F (36.4 C) (Oral)  Resp 18  Wt 161 lb (73.029 kg)  SpO2  95% Physical Exam  Nursing note and vitals reviewed. Constitutional: She appears well-developed and well-nourished. No distress.  HENT:  Head: Normocephalic.  Right Ear: External ear normal.  Left Ear: External ear normal.  Nose: Nose normal.  Mouth/Throat:    Facies consistent with down syndrome  Eyes: Pupils are equal, round, and reactive to light. Right eye exhibits no discharge. Left eye exhibits no discharge.  Neck: Normal range of motion. Neck supple. No spinous process tenderness and no muscular tenderness present.  Cardiovascular: Normal rate, regular rhythm and normal heart sounds.   Pulmonary/Chest: Effort normal and breath sounds normal.  Abdominal: Soft. There is no tenderness.  Neurological: She is alert.  CN 2-12 grossly intact. 5/5 strength in all 4 extremities. Grossly normal sensation  Skin: Skin is warm and dry.    ED Course  Procedures (including critical care time) Labs Review Labs Reviewed  CBC - Abnormal; Notable for the following:    WBC 13.2 (*)    All other components within normal limits  COMPREHENSIVE METABOLIC PANEL - Abnormal; Notable for the following:    GFR calc non Af Amer 60 (*)    GFR calc Af Amer 70 (*)    All other components within normal limits  URINALYSIS, ROUTINE W REFLEX MICROSCOPIC - Abnormal; Notable for the following:    Bilirubin Urine SMALL (*)    Ketones, ur 15 (*)    Leukocytes, UA SMALL (*)    All other components within normal limits  URINE MICROSCOPIC-ADD ON - Abnormal; Notable for the following:    Squamous Epithelial / LPF FEW (*)    All other components within normal limits  CBG MONITORING, ED    Imaging Review Ct Head Wo Contrast  02/22/2014   CLINICAL DATA:  Lumbar level, first time seizure  EXAM: CT HEAD WITHOUT CONTRAST  TECHNIQUE: Contiguous axial images were obtained from the base of the skull through the vertex without intravenous contrast.  COMPARISON:  05/16/2006  FINDINGS: There is no evidence of mass  effect, midline shift or extra-axial fluid collections. There is no evidence of a space-occupying lesion or intracranial hemorrhage. There is no evidence of a cortical-based area of acute infarction. Old left frontal infarct with encephalomalacia.  The ventricles and sulci are appropriate for the patient's age. The basal cisterns are patent.  Visualized portions of the orbits are unremarkable. The visualized portions of the paranasal sinuses and mastoid air cells are unremarkable.  The osseous structures are unremarkable.  IMPRESSION: No acute intracranial pathology.   Electronically Signed   By: Kathreen Devoid   On: 02/22/2014 10:23     EKG  Interpretation   Date/Time:  Tuesday February 22 2014 08:46:51 EDT Ventricular Rate:  68 PR Interval:  138 QRS Duration: 78 QT Interval:  378 QTC Calculation: 402 R Axis:   76 Text Interpretation:  Sinus rhythm Probable LVH with secondary repol abnrm  no significant change since 2012 Confirmed by Lunell Robart  MD, [place] ([id number])  on 02/22/2014 8:51:20 AM      MDM   Final diagnoses:  Episode of shaking    Patient with shaking episodes that are not obviously caused by seizures. Given that she seemed to be nonverbal during the episode this could be used seizure-like activities but she did not seem to have increased tone or fall to the ground. Patient is at her normal mental baseline per family and caregivers at the bedside. Given this is a possible but not even sure first time seizure, workup was initiated including CT head and blood work. This is all benign. Given this we'll refer her to a neurologist as an outpatient but not feel she needs immediate antiepileptics given the questionable etiology of this shaking.    Ephraim Hamburger, MD 02/22/14 [phone]

## 2014-02-22 NOTE — ED Notes (Signed)
CBG of 81

## 2014-02-22 NOTE — ED Notes (Signed)
Dr. Regenia Skeeter in to speak with sister.

## 2014-02-22 NOTE — ED Notes (Signed)
MD at the bedside  

## 2014-02-22 NOTE — ED Notes (Signed)
Dr. Goldston at the bedside. 

## 2014-03-23 ENCOUNTER — Ambulatory Visit (INDEPENDENT_AMBULATORY_CARE_PROVIDER_SITE_OTHER): Payer: Medicare Other | Admitting: Neurology

## 2014-03-23 ENCOUNTER — Encounter: Payer: Self-pay | Admitting: Neurology

## 2014-03-23 VITALS — BP 120/74 | HR 57 | Ht 59.0 in | Wt 151.7 lb

## 2014-03-23 DIAGNOSIS — Q909 Down syndrome, unspecified: Secondary | ICD-10-CM

## 2014-03-23 DIAGNOSIS — R404 Transient alteration of awareness: Secondary | ICD-10-CM

## 2014-03-23 DIAGNOSIS — I10 Essential (primary) hypertension: Secondary | ICD-10-CM

## 2014-03-23 DIAGNOSIS — E785 Hyperlipidemia, unspecified: Secondary | ICD-10-CM

## 2014-03-23 DIAGNOSIS — F71 Moderate intellectual disabilities: Secondary | ICD-10-CM

## 2014-03-23 NOTE — Progress Notes (Signed)
NEUROLOGY CONSULTATION NOTE  Amanda Castillo MRN: [id number] DOB: [birth year]  Referring provider: Dr. Lillia Corporal Primary care provider: Dr. Lillia Corporal  Reason for consult:  Transient episode of unresponsiveness  Dear Dr Alphonzo Grieve:  Thank you for your kind referral of Amanda Castillo for consultation of the above symptoms. Although her history is well known to you, please allow me to reiterate it for the purpose of our medical record. She is accompanied by her sister and group home staff who help provide collateral information. Records and images were personally reviewed where available.  HISTORY OF PRESENT ILLNESS: This is a pleasant 58 year old right-handed woman with a history of Down syndrome with cognitive impairment, hypertension, hyperlipidemia, left frontal stroke in 2003, presenting for an episode of unresponsiveness last 02/22/2014. Per group home staff who was not present at that time, she was told that [place] was being assisted in the bathroom, sitting on the commode when she blanked out. Staff was trying to hold her up.  Per ER notes, she was standing and developed shaking of both arm and legs. They sat her down on the toilet and she was shaking for about 1 minute. They noted she was nonverbal. She had a mild abrasion in her inner lip that appeared to be new per facility. She complained of a mild headache in the ER but was back to baseline.  No recent infections. Glucose normal per EMS. Her CBC showed a WBC of 13.2, otherwise normal. CMP normal. She had a head CT without contrast which I personally reviewed, no acute changes, there was an old left frontal infarct. Her sister reports that this is the third episode of loss of consciousness, however she has no prior history of seizures. The first episode occurred in 2013 when she became unresponsive briefly. The second occurred in 05/2013, she was in the bathroom and lost consciousness while washing her face.  With the  stroke in 2003, she had been living with her mother and was noted to have a faraway look then fell to her knees and started shaking, all she could say was "mommy, mommy."    There is no prior history of seizures. Her sister and staff have not noticed any other episodes of staring/unresponsiveness or myoclonic jerks. Her sister states that Neeka was moved to a new group home and has not been doing well there, with a change in behavior. She would mimic her co-residents, and now makes different facial expressions and mouth movements. Group home staff stated that they do not notice this change in behavior themselves.  Pasty has some difficulty expressing herself, denying any headaches, dizziness, diplopia, dysarthria, dysphagia, neck/back pain, focal numbness/tingling/weakness.  Group home staff reports that she had a 24-hour ambulatory EEG done by Dr. Marlou Sa in [place] a few months ago but [facility] pulled the leads off. She cannot recall why the EEG was done.  Records will be requested for review.  According to her sister, Satcha had an uneventful delivery, delayed development with Down syndrome.  There is no history of febrile convulsions, CNS infections such as meningitis/encephalitis, significant traumatic brain injury, neurosurgical procedures, or family history of seizures.  Laboratory Data:  Lab Results  Component Value Date   WBC 13.2* 02/22/2014   HGB 14.0 02/22/2014   HCT 41.4 02/22/2014   MCV 92.4 02/22/2014   PLT 169 02/22/2014     Chemistry      Component Value Date/Time   NA 144 02/22/2014 0913   K 3.9 02/22/2014  0913   CL 105 02/22/2014 0913   CO2 25 02/22/2014 0913   BUN 14 02/22/2014 0913   CREATININE 1.02 02/22/2014 0913      Component Value Date/Time   CALCIUM 9.4 02/22/2014 0913   ALKPHOS 66 02/22/2014 0913   AST 20 02/22/2014 0913   ALT 18 02/22/2014 0913   BILITOT 0.6 02/22/2014 0913      PAST MEDICAL HISTORY: Past Medical History  Diagnosis Date  . Hyperlipidemia   .  Allergy   . Hypothyroidism   . Depression   . Down's syndrome   . Stroke     in 2006  . PVD (peripheral vascular disease)   . Moderate intellectual disability   . Hallucinations   . Sleep apnea     PAST SURGICAL HISTORY: Past Surgical History  Procedure Laterality Date  . Orif lt foot    . Tonsillectomy    . Colonoscopy  06/12/2012    MEDICATIONS: Current Outpatient Prescriptions on File Prior to Visit  Medication Sig Dispense Refill  . acetaminophen (TYLENOL) 500 MG tablet Take 500 mg by mouth 2 (two) times daily.      Marland Kitchen albuterol (PROVENTIL HFA;VENTOLIN HFA) 108 (90 BASE) MCG/ACT inhaler Inhale 2 puffs into the lungs every 6 (six) hours as needed for shortness of breath.      . ALPRAZolam (XANAX) 0.25 MG tablet Take 0.25 mg by mouth 2 (two) times daily.      Marland Kitchen amLODipine (NORVASC) 5 MG tablet Take 5 mg by mouth at bedtime.      Marland Kitchen aspirin 81 MG tablet Take 81 mg by mouth daily.      . calcium acetate (PHOSLO) 667 MG capsule Take 2,001 mg by mouth 3 (three) times daily with meals.      . carvedilol (COREG) 25 MG tablet Take 25 mg by mouth 2 (two) times daily with a meal.      . cyclobenzaprine (FLEXERIL) 5 MG tablet Take 5 mg by mouth 2 (two) times daily.      . fluticasone (FLONASE) 50 MCG/ACT nasal spray Place 1 spray into both nostrils daily.      Marland Kitchen lisinopril (PRINIVIL,ZESTRIL) 20 MG tablet Take 20 mg by mouth 2 (two) times daily.      Marland Kitchen loperamide (IMODIUM) 2 MG capsule Take 2 mg by mouth 2 (two) times daily with a meal.      . Multiple Vitamin (MULTIVITAMIN WITH MINERALS) TABS tablet Take 1 tablet by mouth daily.      . nitroGLYCERIN (NITROSTAT) 0.4 MG SL tablet Place 0.4 mg under the tongue every 5 (five) minutes as needed for chest pain.      Marland Kitchen omeprazole (PRILOSEC) 20 MG capsule Take 20 mg by mouth daily.       . pravastatin (PRAVACHOL) 20 MG tablet Take 20 mg by mouth daily.      . risperiDONE (RISPERDAL) 0.5 MG tablet Take 0.5 mg by mouth 2 (two) times daily.      .  temazepam (RESTORIL) 15 MG capsule Take 15 mg by mouth at bedtime as needed for sleep.       No current facility-administered medications on file prior to visit.    ALLERGIES: No Known Allergies  FAMILY HISTORY: Family History  Problem Relation Age of Onset  . Colon cancer Paternal Uncle   . Colon cancer Maternal Grandfather     SOCIAL HISTORY: History   Social History  . Marital Status: Single    Spouse Name: N/A  Number of Children: N/A  . Years of Education: N/A   Occupational History  . Not on file.   Social History Main Topics  . Smoking status: Never Smoker   . Smokeless tobacco: Never Used  . Alcohol Use: No  . Drug Use: No  . Sexual Activity: Not on file   Other Topics Concern  . Not on file   Social History Narrative  . No narrative on file    REVIEW OF SYSTEMS: Constitutional: No fevers, chills, or sweats, no generalized fatigue, change in appetite Eyes: No visual changes, double vision, eye pain Ear, nose and throat: No hearing loss, ear pain, nasal congestion, sore throat Cardiovascular: No chest pain, palpitations Respiratory:  No shortness of breath at rest or with exertion, wheezes GastrointestinaI: No nausea, vomiting, diarrhea, abdominal pain, fecal incontinence Genitourinary:  No dysuria, urinary retention or frequency Musculoskeletal:  No neck pain, back pain Integumentary: No rash, pruritus, skin lesions Neurological: as above Psychiatric: No depression, insomnia, anxiety Endocrine: No palpitations, fatigue, diaphoresis, mood swings, change in appetite, change in weight, increased thirst Hematologic/Lymphatic:  No anemia, purpura, petechiae. Allergic/Immunologic: no itchy/runny eyes, nasal congestion, recent allergic reactions, rashes  PHYSICAL EXAM: Filed Vitals:   03/23/14 1115  BP: 120/74  Pulse: 57   General: No acute distress, Down syndrome facies Head:  Normocephalic/atraumatic Eyes: Fundoscopic exam shows bilateral sharp  discs, no vessel changes, exudates, or hemorrhages Neck: supple, no paraspinal tenderness, full range of motion Back: No paraspinal tenderness Heart: regular rate and rhythm Lungs: Clear to auscultation bilaterally. Vascular: No carotid bruits. Skin/Extremities: No rash, no edema Neurological Exam: Mental status: alert and oriented to person, place, no dysarthria or aphasia, Fund of knowledge is reduced.  Recent and remote memory are impaired, patient mostly says yes to all questions.  Attention and concentration are normal.    Able to name objects and repeat phrases. Cranial nerves: CN I: not tested CN II: pupils equal, round and reactive to light, visual fields intact, fundi unremarkable. CN III, IV, VI:  full range of motion, no nystagmus, no ptosis CN V: facial sensation intact CN VII: upper and lower face symmetric CN VIII: hearing intact to finger rub CN IX, X: gag intact, uvula midline CN XI: sternocleidomastoid and trapezius muscles intact CN XII: tongue midline Bulk & Tone: hypotonic, no fasciculations. Motor: 5/5 throughout with no pronator drift. Sensation: intact to light touch, cold, pin, vibration and joint position sense.  No extinction to double simultaneous stimulation.  Romberg test negative Deep Tendon Reflexes: +1 throughout, no ankle clonus Plantar responses: downgoing bilaterally Cerebellar: no incoordination on finger to nose testing Gait: slow and cautious, no ataxia, unable to tandem walk Tremor: none  IMPRESSION: This is a 58 year old right-handed woman with a history of Down syndrome with moderate cognitive impairment, hypertension, hyperlipidemia, and left frontal stroke in 2003 with no residual deficits, presenting with an episode of decreased responsiveness with report of brief shaking. Her exam today is non-focal. She does have risk factors for seizures with old stroke and Down syndrome, however the episodes describe also raise the possibility of  presyncope/syncope. She also had a similar presentation when she had the stroke in 2003. MRI brain without contrast will be ordered to assess for new infarct. BP today normal, continue control of vascular risk factors, daily aspirin.  Routine EEG will be ordered. Records from Dr. Marlou Sa in [place] will be requested for review. No clear indication for starting anti-seizure medication at this time. Findings were discussed  with her sister, who expressed understanding. She will follow-up after the tests.  Thank you for allowing me to participate in the care of this patient. Please do not hesitate to call for any questions or concerns.   Ellouise Newer, M.D.  CC: Dr. Alphonzo Grieve

## 2014-03-23 NOTE — Patient Instructions (Addendum)
1. Schedule open MRI brain without contrast. We have scheduled you at [facility] for your OPEN MRI on 04/02/14 at 9:15 am. Please arrive 30 minutes prior and go to [place]. If you need to change this appt please call [phone]. 2. Schedule routine EEG. We will call you with an appt.  3. Follow-up after tests

## 2014-03-24 ENCOUNTER — Encounter: Payer: Self-pay | Admitting: Neurology

## 2014-03-24 DIAGNOSIS — E785 Hyperlipidemia, unspecified: Secondary | ICD-10-CM | POA: Insufficient documentation

## 2014-03-24 DIAGNOSIS — F71 Moderate intellectual disabilities: Secondary | ICD-10-CM | POA: Insufficient documentation

## 2014-03-24 DIAGNOSIS — G4730 Sleep apnea, unspecified: Secondary | ICD-10-CM | POA: Insufficient documentation

## 2014-03-24 DIAGNOSIS — E039 Hypothyroidism, unspecified: Secondary | ICD-10-CM | POA: Insufficient documentation

## 2014-03-24 DIAGNOSIS — I10 Essential (primary) hypertension: Secondary | ICD-10-CM | POA: Insufficient documentation

## 2014-03-24 DIAGNOSIS — Q909 Down syndrome, unspecified: Secondary | ICD-10-CM | POA: Insufficient documentation

## 2014-03-24 DIAGNOSIS — R404 Transient alteration of awareness: Secondary | ICD-10-CM | POA: Insufficient documentation

## 2014-03-25 ENCOUNTER — Telehealth: Payer: Self-pay | Admitting: Neurology

## 2014-03-25 NOTE — Telephone Encounter (Signed)
Amanda Castillo is returning call to [facility]. Asks that you call back at 12n. CB# [phone] / Sherri S.

## 2014-03-25 NOTE — Telephone Encounter (Signed)
Spoke with Thayer Headings about needing record release signed to get records from previous Neurologist/Dr. Marlou Sa in [place]. She is going to stop by the office today to sign the release.

## 2014-03-25 NOTE — Telephone Encounter (Signed)
Thayer Headings, Pt's sister and POA called/returning your call at 9:25AM. C/B [phone]

## 2014-03-25 NOTE — Telephone Encounter (Signed)
Lmom to return my call. 

## 2014-03-28 ENCOUNTER — Ambulatory Visit (INDEPENDENT_AMBULATORY_CARE_PROVIDER_SITE_OTHER): Payer: Medicare Other | Admitting: Physician Assistant

## 2014-03-28 ENCOUNTER — Other Ambulatory Visit (INDEPENDENT_AMBULATORY_CARE_PROVIDER_SITE_OTHER): Payer: Medicare Other

## 2014-03-28 ENCOUNTER — Encounter: Payer: Self-pay | Admitting: Physician Assistant

## 2014-03-28 VITALS — BP 120/72 | HR 68 | Ht 59.0 in | Wt 146.5 lb

## 2014-03-28 DIAGNOSIS — R109 Unspecified abdominal pain: Secondary | ICD-10-CM

## 2014-03-28 LAB — CBC WITH DIFFERENTIAL/PLATELET
Basophils Absolute: 0.1 10*3/uL (ref 0.0–0.1)
Basophils Relative: 1.2 % (ref 0.0–3.0)
Eosinophils Absolute: 0.1 10*3/uL (ref 0.0–0.7)
Eosinophils Relative: 1 % (ref 0.0–5.0)
HCT: 43.7 % (ref 36.0–46.0)
Hemoglobin: 14.4 g/dL (ref 12.0–15.0)
LYMPHS PCT: 25.5 % (ref 12.0–46.0)
Lymphs Abs: 1.8 10*3/uL (ref 0.7–4.0)
MCHC: 32.9 g/dL (ref 30.0–36.0)
MCV: 94.1 fl (ref 78.0–100.0)
MONOS PCT: 6.4 % (ref 3.0–12.0)
Monocytes Absolute: 0.4 10*3/uL (ref 0.1–1.0)
NEUTROS PCT: 65.9 % (ref 43.0–77.0)
Neutro Abs: 4.6 10*3/uL (ref 1.4–7.7)
PLATELETS: 241 10*3/uL (ref 150.0–400.0)
RBC: 4.65 Mil/uL (ref 3.87–5.11)
RDW: 15.2 % (ref 11.5–15.5)
WBC: 7 10*3/uL (ref 4.0–10.5)

## 2014-03-28 LAB — URINALYSIS, ROUTINE W REFLEX MICROSCOPIC
BILIRUBIN URINE: NEGATIVE
Hgb urine dipstick: NEGATIVE
Ketones, ur: NEGATIVE
NITRITE: NEGATIVE
PH: 6.5 (ref 5.0–8.0)
RBC / HPF: NONE SEEN (ref 0–?)
Specific Gravity, Urine: 1.01 (ref 1.000–1.030)
Total Protein, Urine: NEGATIVE
Urine Glucose: NEGATIVE
Urobilinogen, UA: 0.2 (ref 0.0–1.0)

## 2014-03-28 NOTE — Progress Notes (Signed)
Subjective:    Patient ID: Amanda Castillo, female    DOB: 1956/04/09, 58 y.o.   MRN: [id number]  HPI  Amanda Castillo is a pleasant 58 year old white female with Down's syndrome known to Dr. Carlean Purl. She was last seen in the office in May of 2015 at that time there was concern with possible acid reflux and dysphagia. She subsequently underwent barium swallow which showed mild dysmotility, no stricture and otherwise normal study. She apparently had been taking Prilosec 20 mg by mouth daily for some time. This appointment was made today by the patient's sister who apparently wanted to discuss medications however her sister did not come to the appointment. Patient is able to offer little history. When asked whether her stomach hurts she states "a little bit". She also said yes to dysuria. Her healthcare worker whocame with her today and is with her 3 days a week states that she has been eating well and does not seem to have any difficulty swallowing, and no coughing, strangling etc. Her bowel movements have been normal. They are unaware of any complaints and her uncertain why her sister on her off of Prilosec. She has lost some weight and the  healthcare worker states that she seems to eat less later in the week and becomes more anxious as she is anticipating spending weekends with her family.    Review of Systems  Constitutional: Positive for unexpected weight change.  HENT: Negative.   Eyes: Negative.   Respiratory: Negative.   Cardiovascular: Negative.   Gastrointestinal: Positive for abdominal pain.  Endocrine: Negative.   Genitourinary: Positive for dysuria.  Musculoskeletal: Negative.   Allergic/Immunologic: Negative.   Neurological: Negative.   Hematological: Negative.   Psychiatric/Behavioral: Negative.    Outpatient Prescriptions Prior to Visit  Medication Sig Dispense Refill  . acetaminophen (TYLENOL) 500 MG tablet Take 500 mg by mouth 2 (two) times daily.      Marland Kitchen albuterol (PROVENTIL  HFA;VENTOLIN HFA) 108 (90 BASE) MCG/ACT inhaler Inhale 2 puffs into the lungs every 6 (six) hours as needed for shortness of breath.      . ALPRAZolam (XANAX) 0.25 MG tablet Take 0.25 mg by mouth 2 (two) times daily.      Marland Kitchen amLODipine (NORVASC) 5 MG tablet Take 5 mg by mouth at bedtime.      Marland Kitchen aspirin 81 MG tablet Take 81 mg by mouth daily.      . calcium acetate (PHOSLO) 667 MG capsule Take 2,001 mg by mouth 3 (three) times daily with meals.      . carvedilol (COREG) 25 MG tablet Take 25 mg by mouth 2 (two) times daily with a meal.      . cyclobenzaprine (FLEXERIL) 5 MG tablet Take 5 mg by mouth 2 (two) times daily.      . fluticasone (FLONASE) 50 MCG/ACT nasal spray Place 1 spray into both nostrils daily.      Marland Kitchen lisinopril (PRINIVIL,ZESTRIL) 20 MG tablet Take 20 mg by mouth 2 (two) times daily.      Marland Kitchen loperamide (IMODIUM) 2 MG capsule Take 2 mg by mouth 2 (two) times daily with a meal.      . Multiple Vitamin (MULTIVITAMIN WITH MINERALS) TABS tablet Take 1 tablet by mouth daily.      . nitroGLYCERIN (NITROSTAT) 0.4 MG SL tablet Place 0.4 mg under the tongue every 5 (five) minutes as needed for chest pain.      . pravastatin (PRAVACHOL) 20 MG tablet Take 20 mg by mouth  daily.      . risperiDONE (RISPERDAL) 0.5 MG tablet Take 0.5 mg by mouth 2 (two) times daily.      . temazepam (RESTORIL) 15 MG capsule Take 15 mg by mouth at bedtime as needed for sleep.      Marland Kitchen omeprazole (PRILOSEC) 20 MG capsule Take 20 mg by mouth daily.        No facility-administered medications prior to visit.   No Known Allergies Patient Active Problem List   Diagnosis Date Noted  . Awareness alteration, transient 03/24/2014  . Down syndrome 03/24/2014  . Essential hypertension 03/24/2014  . Hyperlipidemia 03/24/2014  . Moderate intellectual disability 03/24/2014  . Hypothyroidism 03/24/2014  . Sleep apnea 03/24/2014   History  Substance Use Topics  . Smoking status: Never Smoker   . Smokeless tobacco: Never  Used  . Alcohol Use: No   family history includes Colon cancer in her maternal grandfather and paternal uncle.     Objective:   Physical Exam well-developed middle-aged female with Down's syndrome, accompanied by healthcare worker blood pressure 120/72 pulse 68 height 4 foot 11 weight 146. HEENT; nontraumatic normocephalic EOMI PERRLA sclera anicteric, Supple;no JVD, Cardiovascular; regular rate and rhythm with Y1-E5 soft systolic murmur, Pulmonary; clear bilaterally, Abdomen; large soft minimally tender in the suprapubic area no guarding or rebound no palpable mass or hepatosplenomegaly bowel sounds are present, Rectal; exam not done, Extremities; no clubbing cyanosis or edema skin warm and dry, Psych ;patient pleasant smiling and answers in one or 2 word simple phrases        Assessment & Plan:  #76  58 year old female with Down syndrome with no specific GI complaints today. #2 mild nonspecific dysmotility on recent barium swallow #3 dysuria rule out UTI #4 colon neoplasia surveillance-patient had normal colonoscopy January 2014  Plan; Will leave off Prilosec for now Check CBC with differential, UA Family may reschedule appointment if there are other concerns.

## 2014-03-28 NOTE — Patient Instructions (Signed)
Please go to the basement level to have your labs drawn and urine test.

## 2014-04-02 ENCOUNTER — Ambulatory Visit
Admission: RE | Admit: 2014-04-02 | Discharge: 2014-04-02 | Disposition: A | Discharge status: Home / Self Care | Payer: Medicare Other | Source: Ambulatory Visit | Attending: Neurology | Admitting: Neurology

## 2014-04-02 DIAGNOSIS — R404 Transient alteration of awareness: Secondary | ICD-10-CM

## 2014-04-04 NOTE — Progress Notes (Signed)
Agree with Ms. Esterwood's assessment and plan. Madelyn Tlatelpa E. Kadyn Guild, MD, FACG   

## 2014-04-06 ENCOUNTER — Ambulatory Visit (INDEPENDENT_AMBULATORY_CARE_PROVIDER_SITE_OTHER): Payer: Medicare Other | Admitting: Neurology

## 2014-04-06 DIAGNOSIS — R404 Transient alteration of awareness: Secondary | ICD-10-CM

## 2014-04-07 NOTE — Procedures (Signed)
ELECTROENCEPHALOGRAM REPORT  Date of Study: 04/06/2014  Patient's Name: Amanda Castillo MRN: [id number] Date of Birth: February 03, 1956  Referring Provider: Dr. Ellouise Newer  Clinical History: This is a 58 year old woman with a history of Down syndrome with moderate cognitive impairment and left frontal stroke with an episode of decreased responsiveness with report of brief shaking.  Medications: Temazepam, Xanax, Risperdal, Flexeril, Prinivil, Pravachol, aspirin, Norvasc  Technical Summary: A multichannel digital EEG recording measured by the international 10-20 system with electrodes applied with paste and impedances below 5000 ohms performed in our laboratory with EKG monitoring in a predominantly drowsy and asleep patient.  Hyperventilation and photic stimulation were performed.  The digital EEG was referentially recorded, reformatted, and digitally filtered in a variety of bipolar and referential montages for optimal display.    Description: The patient is a predominantly drowsy and asleep during the recording.  During brief period of maximal wakefulness, there is a symmetric, medium voltage 10 Hz posterior dominant rhythm that attenuates with eye opening.  There is occasional focal 5 Hz theta slowing seen over the left temporal region.  During drowsiness and sleep, there is an increase in theta slowing of the background.  Vertex waves and symmetric sleep spindles were seen.  Hyperventilation and photic stimulation did not elicit any abnormalities.  There were no epileptiform discharges or electrographic seizures seen.    EKG lead was unremarkable.  Impression: This predominantly drowsy and asleep EEG is abnormal due to occasional focal slowing over the left temporal region.  Clinical Correlation of the above findings indicates focal cerebral dysfunction suggestive of underlying structural or physiologic abnormality. The absence of epileptiform discharges does not exclude a clinical  diagnosis of epilepsy.  If further clinical questions remain, prolonged EEG may be helpful.  Clinical correlation is advised.   Ellouise Newer, M.D.

## 2014-04-27 ENCOUNTER — Ambulatory Visit (INDEPENDENT_AMBULATORY_CARE_PROVIDER_SITE_OTHER): Payer: Medicare Other | Admitting: Neurology

## 2014-04-27 ENCOUNTER — Encounter: Payer: Self-pay | Admitting: Neurology

## 2014-04-27 VITALS — BP 118/76 | Resp 16 | Ht 59.0 in | Wt 149.9 lb

## 2014-04-27 DIAGNOSIS — R404 Transient alteration of awareness: Secondary | ICD-10-CM

## 2014-04-27 DIAGNOSIS — Q909 Down syndrome, unspecified: Secondary | ICD-10-CM

## 2014-04-27 NOTE — Progress Notes (Signed)
NEUROLOGY FOLLOW UP OFFICE NOTE  Amanda Castillo [id number]  HISTORY OF PRESENT ILLNESS: I had the pleasure of seeing Amanda Castillo in follow-up in the neurology clinic on 04/27/2014.  The patient was last seen a month ago after an episode of unresponsiveness last 02/22/14. She is again accompanied by her sister and group home staff. Records and images were personally reviewed where available.  I personally reviewed MRI brain without contrast which did not show any acute changes. There was a remote left frontal lobe infarct with ex vacuo dilation of the left lateral ventricle, and a smaller remote right parietal infarct. Mild generalized atrophy. I reviewed records from her previous neurologist Dr. Adolphus Birchwood, she had several EEGs done as noted below which were within normal limits. She was never treated with anti-seizure medications. Amanda Castillo has been doing well with no further similar episodes of loss of consciousness.   Her sister feels that she is only starting to grieve her mother's death, going to the bathroom and running water non-stop. She will be meeting with a behaviorist in the group home soon. Group home staff reports that she does not do this behavior in the home. Her speech has been harder to understand. She would trip but be able to catch herself, she can ambulate without a walker. She was coughing and congested last weekend, her sister is concerned that she has a pneumonia, as she was asymptomatic in the past.  HPI: This is a pleasant 58 yo RH woman with a history of Down syndrome with cognitive impairment, hypertension, hyperlipidemia, left frontal stroke in 2003, with recurrent episodes of loss of consciousness. She presented initially after an episode of unresponsiveness last 02/22/2014. Per group home staff who was not present at that time, she was told that Amanda Castillo was being assisted in the bathroom, sitting on the commode when she blanked out. Staff was trying to hold her up.  Per ER notes, she was standing and developed shaking of both arm and legs. They sat her down on the toilet and she was shaking for about 1 minute. They noted she was nonverbal. She had a mild abrasion in her inner lip that appeared to be new per facility. She complained of a mild headache in the ER but was back to baseline. No recent infections. Glucose normal per EMS. Her CBC showed a WBC of 13.2, otherwise normal. CMP normal. She had a head CT without contrast which I personally reviewed, no acute changes, there was an old left frontal infarct. Her sister reports that this is the third episode of loss of consciousness, however she has no prior history of seizures. The first episode occurred in 2013 when she became unresponsive briefly. The second occurred in 05/2013, she was in the bathroom and lost consciousness while washing her face. With the stroke in 2003, she had been living with her mother and was noted to have a faraway look then fell to her knees and started shaking, all she could say was "mommy, mommy."   There is no prior history of seizures. Her sister and staff have not noticed any other episodes of staring/unresponsiveness or myoclonic jerks. Her sister states that Amanda Castillo was moved to a new group home and has not been doing well there, with a change in behavior. She would mimic her co-residents, and now makes different facial expressions and mouth movements. Group home staff stated that they do not notice this change in behavior themselves. Amanda Castillo has some difficulty expressing herself, denying any headaches,  dizziness, diplopia, dysarthria, dysphagia, neck/back pain, focal numbness/tingling/weakness.  According to her sister, Amanda Castillo had an uneventful delivery, delayed development with Down syndrome. There is no history of febrile convulsions, CNS infections such as meningitis/encephalitis, significant traumatic brain injury, neurosurgical procedures, or family history of  seizures.  Diagnostic Data: EEG 12/29/12: This EEG could be read within normal limits for age and levels of consciousness. There was a 2- second burst of high voltage slowing which occurred at the end of photic stimulation. It's difficult to know what this means. Overall this was a normal tracing, done in awake without drowsiness or sleep captured. EEG 10/06/13: Ambulatory EEG terminated around the 12th hour. There is left temporal delta slowing seen. No epileptiform discharges seen.  EEG 11/16/13: This EEG is normal for age and levels of consciousness done primarily in awake but in a finely relaxed patient.  PAST MEDICAL HISTORY: Past Medical History  Diagnosis Date  . Hyperlipidemia   . Allergy   . Hypothyroidism   . Depression   . Down's syndrome   . Stroke     in 2006  . PVD (peripheral vascular disease)   . Moderate intellectual disability   . Hallucinations   . Sleep apnea     MEDICATIONS: Current Outpatient Prescriptions on File Prior to Visit  Medication Sig Dispense Refill  . acetaminophen (TYLENOL) 500 MG tablet Take 500 mg by mouth 2 (two) times daily.    Marland Kitchen albuterol (PROVENTIL HFA;VENTOLIN HFA) 108 (90 BASE) MCG/ACT inhaler Inhale 2 puffs into the lungs every 6 (six) hours as needed for shortness of breath.    . ALPRAZolam (XANAX) 0.25 MG tablet Take 0.25 mg by mouth 2 (two) times daily.    Marland Kitchen amLODipine (NORVASC) 5 MG tablet Take 5 mg by mouth at bedtime.    Marland Kitchen aspirin 81 MG tablet Take 81 mg by mouth daily.    . calcium acetate (PHOSLO) 667 MG capsule Take 2,001 mg by mouth 3 (three) times daily with meals.    . calcium-vitamin D (OSCAL WITH D) 500-200 MG-UNIT per tablet Take 1 tablet by mouth 2 (two) times daily.    . carvedilol (COREG) 25 MG tablet Take 25 mg by mouth 2 (two) times daily with a meal.    . cholecalciferol (VITAMIN D) 400 UNITS TABS tablet Take 400 Units by mouth daily.    . cyclobenzaprine (FLEXERIL) 5 MG tablet Take 5 mg by mouth 2 (two) times daily.     . fluticasone (FLONASE) 50 MCG/ACT nasal spray Place 1 spray into both nostrils daily.    Marland Kitchen levothyroxine (SYNTHROID, LEVOTHROID) 75 MCG tablet Take 75 mcg by mouth daily before breakfast.    . lisinopril (PRINIVIL,ZESTRIL) 20 MG tablet Take 20 mg by mouth 2 (two) times daily.    Marland Kitchen loperamide (IMODIUM) 2 MG capsule Take 2 mg by mouth 2 (two) times daily with a meal.    . loratadine (CLARITIN) 10 MG tablet Take 10 mg by mouth daily.    Marland Kitchen losartan (COZAAR) 25 MG tablet Take 25 mg by mouth daily.    . Multiple Vitamin (MULTIVITAMIN WITH MINERALS) TABS tablet Take 1 tablet by mouth daily.    . nitroGLYCERIN (NITROSTAT) 0.4 MG SL tablet Place 0.4 mg under the tongue every 5 (five) minutes as needed for chest pain.    Marland Kitchen omega-3 acid ethyl esters (LOVAZA) 1 G capsule Take 1 g by mouth daily.    . pravastatin (PRAVACHOL) 20 MG tablet Take 20 mg by mouth daily.    Marland Kitchen  Propylene Glycol (SYSTANE BALANCE OP) Apply 1 drop to eye daily. Place 1 drop in each eye, every day    . risperiDONE (RISPERDAL) 0.5 MG tablet Take 0.5 mg by mouth 2 (two) times daily.    . simvastatin (ZOCOR) 40 MG tablet Take 40 mg by mouth daily. At bedtime    . temazepam (RESTORIL) 15 MG capsule Take 15 mg by mouth at bedtime as needed for sleep.     No current facility-administered medications on file prior to visit.    ALLERGIES: No Known Allergies  FAMILY HISTORY: Family History  Problem Relation Age of Onset  . Colon cancer Paternal Uncle   . Colon cancer Maternal Grandfather     SOCIAL HISTORY: History   Social History  . Marital Status: Single    Spouse Name: N/A    Number of Children: N/A  . Years of Education: N/A   Occupational History  . Not on file.   Social History Main Topics  . Smoking status: Never Smoker   . Smokeless tobacco: Never Used  . Alcohol Use: No  . Drug Use: No  . Sexual Activity: Not on file   Other Topics Concern  . Not on file   Social History Narrative    REVIEW OF  SYSTEMS: Constitutional: No fevers, chills, or sweats, no generalized fatigue, change in appetite Eyes: No visual changes, double vision, eye pain Ear, nose and throat: No hearing loss, ear pain, nasal congestion, sore throat Cardiovascular: No chest pain, palpitations Respiratory:  No shortness of breath at rest or with exertion, wheezes GastrointestinaI: No nausea, vomiting, diarrhea, abdominal pain, fecal incontinence Genitourinary:  No dysuria, urinary retention or frequency Musculoskeletal:  No neck pain, back pain Integumentary: No rash, pruritus, skin lesions Neurological: as above Psychiatric: No depression, insomnia, anxiety Endocrine: No palpitations, fatigue, diaphoresis, mood swings, change in appetite, change in weight, increased thirst Hematologic/Lymphatic:  No anemia, purpura, petechiae. Allergic/Immunologic: no itchy/runny eyes, nasal congestion, recent allergic reactions, rashes  PHYSICAL EXAM: Filed Vitals:   04/27/14 1111  BP: 118/76  Resp: 16   General: No acute distress, Down syndrome facies Head: Normocephalic/atraumatic Eyes: Fundoscopic exam shows bilateral sharp discs, no vessel changes, exudates, or hemorrhages Neck: supple, no paraspinal tenderness, full range of motion Back: No paraspinal tenderness Heart: regular rate and rhythm Lungs: Clear to auscultation bilaterally. Vascular: No carotid bruits. Skin/Extremities: No rash, no edema Neurological Exam: Mental status: alert and oriented to person, place, no dysarthria or aphasia, Fund of knowledge is reduced. Recent and remote memory are impaired, patient mostly says yes to all questions (similar to prior). Attention and concentration are normal. Able to name objects and repeat phrases. Cranial nerves: CN I: not tested CN II: pupils equal, round and reactive to light, visual fields intact, fundi unremarkable. CN III, IV, VI: full range of motion, no nystagmus, no ptosis CN V: facial sensation  intact CN VII: upper and lower face symmetric CN VIII: hearing intact to finger rub CN IX, X: gag intact, uvula midline CN XI: sternocleidomastoid and trapezius muscles intact CN XII: tongue midline Bulk & Tone: hypotonic, no fasciculations. Motor: 5/5 throughout with no pronator drift. Sensation: intact to light touch, cold, pin, vibration and joint position sense. No extinction to double simultaneous stimulation. Romberg test negative Deep Tendon Reflexes: +1 throughout, no ankle clonus Plantar responses: downgoing bilaterally Cerebellar: no incoordination on finger to nose testing Gait: slow and cautious, wide-based,unable to tandem walk Tremor: none  IMPRESSION: This is a 58 yo RH woman  with a history of Down syndrome with moderate cognitive impairment, hypertension, hyperlipidemia, and left frontal stroke in 2003 with no residual deficits, with episodes of loss of consciousness of unclear etiology, syncope versus seizure. MRI brain did not show any new stroke. She does have a history of prior strokes which may predispose her to seizures, however all testing to date have not shown any clear evidence for epilepsy. She is not on any seizure medication, no clear indication for starting AED at this point. Findings were discussed with her sister, who expressed agreement. We will continue to monitor her symptoms, she will follow-up in 6 months or earlier if needed.  Thank you for allowing me to participate in her care.  Please do not hesitate to call for any questions or concerns.  The duration of this appointment visit was 15 minutes of face-to-face time with the patient.  Greater than 50% of this time was spent in counseling, explanation of diagnosis, planning of further management, and coordination of care.   Ellouise Newer, M.D.   CC: Dr. Alphonzo Grieve

## 2014-04-27 NOTE — Patient Instructions (Signed)
1. Continue current medications 2. Discuss cough and history of asymptomatic pneumonia with PCP 3. Follow-up in 6 months, call for any problems

## 2014-04-30 ENCOUNTER — Encounter: Payer: Self-pay | Admitting: Neurology

## 2014-05-02 NOTE — Progress Notes (Signed)
Done

## 2014-06-09 ENCOUNTER — Emergency Department (HOSPITAL_BASED_OUTPATIENT_CLINIC_OR_DEPARTMENT_OTHER)
Admission: EM | Admit: 2014-06-09 | Discharge: 2014-06-09 | Disposition: A | Discharge status: Home / Self Care | Payer: Medicare Other | Attending: Emergency Medicine | Admitting: Emergency Medicine

## 2014-06-09 ENCOUNTER — Encounter (HOSPITAL_BASED_OUTPATIENT_CLINIC_OR_DEPARTMENT_OTHER): Payer: Self-pay | Admitting: *Deleted

## 2014-06-09 ENCOUNTER — Emergency Department (HOSPITAL_BASED_OUTPATIENT_CLINIC_OR_DEPARTMENT_OTHER): Payer: Medicare Other

## 2014-06-09 DIAGNOSIS — Z8673 Personal history of transient ischemic attack (TIA), and cerebral infarction without residual deficits: Secondary | ICD-10-CM | POA: Diagnosis not present

## 2014-06-09 DIAGNOSIS — Q909 Down syndrome, unspecified: Secondary | ICD-10-CM | POA: Diagnosis not present

## 2014-06-09 DIAGNOSIS — F329 Major depressive disorder, single episode, unspecified: Secondary | ICD-10-CM | POA: Diagnosis not present

## 2014-06-09 DIAGNOSIS — E785 Hyperlipidemia, unspecified: Secondary | ICD-10-CM | POA: Insufficient documentation

## 2014-06-09 DIAGNOSIS — Y92008 Other place in unspecified non-institutional (private) residence as the place of occurrence of the external cause: Secondary | ICD-10-CM | POA: Diagnosis not present

## 2014-06-09 DIAGNOSIS — Z8669 Personal history of other diseases of the nervous system and sense organs: Secondary | ICD-10-CM | POA: Insufficient documentation

## 2014-06-09 DIAGNOSIS — S8991XA Unspecified injury of right lower leg, initial encounter: Secondary | ICD-10-CM | POA: Diagnosis present

## 2014-06-09 DIAGNOSIS — Y998 Other external cause status: Secondary | ICD-10-CM | POA: Insufficient documentation

## 2014-06-09 DIAGNOSIS — Y9389 Activity, other specified: Secondary | ICD-10-CM | POA: Insufficient documentation

## 2014-06-09 DIAGNOSIS — Z7951 Long term (current) use of inhaled steroids: Secondary | ICD-10-CM | POA: Insufficient documentation

## 2014-06-09 DIAGNOSIS — W1839XA Other fall on same level, initial encounter: Secondary | ICD-10-CM | POA: Diagnosis not present

## 2014-06-09 DIAGNOSIS — S8001XA Contusion of right knee, initial encounter: Secondary | ICD-10-CM | POA: Diagnosis not present

## 2014-06-09 DIAGNOSIS — Z79899 Other long term (current) drug therapy: Secondary | ICD-10-CM | POA: Insufficient documentation

## 2014-06-09 DIAGNOSIS — S8002XA Contusion of left knee, initial encounter: Secondary | ICD-10-CM | POA: Insufficient documentation

## 2014-06-09 DIAGNOSIS — Z7982 Long term (current) use of aspirin: Secondary | ICD-10-CM | POA: Diagnosis not present

## 2014-06-09 DIAGNOSIS — E039 Hypothyroidism, unspecified: Secondary | ICD-10-CM | POA: Insufficient documentation

## 2014-06-09 DIAGNOSIS — W19XXXA Unspecified fall, initial encounter: Secondary | ICD-10-CM

## 2014-06-09 NOTE — ED Provider Notes (Signed)
CSN: [id number]     Arrival date & time 06/09/14  1235 History   First MD Initiated Contact with Patient 06/09/14 1441     Chief Complaint  Patient presents with  . Fall     (Consider location/radiation/quality/duration/timing/severity/associated sxs/prior Treatment) HPI  A LEVEL 5 CAVEAT PERTAINS DUE TO POOR HISTORIAN/DOWN'S SYNDROME Pt lives in a group home, has hx of down's syndrome, had an unwitnessed fall at her group home.  She fell forward.  Pt c/o pain in her knees.  Has been ambulating with her walker without difficulty.  No LOC, denies neck or back pain.    Past Medical History  Diagnosis Date  . Hyperlipidemia   . Allergy   . Hypothyroidism   . Depression   . Down's syndrome   . Stroke     in 2006  . PVD (peripheral vascular disease)   . Moderate intellectual disability   . Hallucinations   . Sleep apnea    Past Surgical History  Procedure Laterality Date  . Orif lt foot    . Tonsillectomy    . Colonoscopy  06/12/2012   Family History  Problem Relation Age of Onset  . Colon cancer Paternal Uncle   . Colon cancer Maternal Grandfather    History  Substance Use Topics  . Smoking status: Never Smoker   . Smokeless tobacco: Never Used  . Alcohol Use: No   OB History    No data available     Review of Systems  UNABLE TO OBTAIN ROS DUE TO LEVEL 5 CAVEAT    Allergies  Review of patient's allergies indicates no known allergies.  Home Medications   Prior to Admission medications   Medication Sig Start Date End Date Taking? Authorizing Provider  acetaminophen (TYLENOL) 500 MG tablet Take 500 mg by mouth 2 (two) times daily.    Historical Provider, MD  albuterol (PROVENTIL HFA;VENTOLIN HFA) 108 (90 BASE) MCG/ACT inhaler Inhale 2 puffs into the lungs every 6 (six) hours as needed for shortness of breath.    Historical Provider, MD  ALPRAZolam Duanne Moron) 0.25 MG tablet Take 0.25 mg by mouth 2 (two) times daily.    Historical Provider, MD  amLODipine (NORVASC)  5 MG tablet Take 5 mg by mouth at bedtime.    Historical Provider, MD  aspirin 81 MG tablet Take 81 mg by mouth daily.    Historical Provider, MD  calcium acetate (PHOSLO) 667 MG capsule Take 2,001 mg by mouth 3 (three) times daily with meals.    Historical Provider, MD  calcium-vitamin D (OSCAL WITH D) 500-200 MG-UNIT per tablet Take 1 tablet by mouth 2 (two) times daily.    Historical Provider, MD  carvedilol (COREG) 25 MG tablet Take 25 mg by mouth 2 (two) times daily with a meal.    Historical Provider, MD  cholecalciferol (VITAMIN D) 400 UNITS TABS tablet Take 400 Units by mouth daily.    Historical Provider, MD  cyclobenzaprine (FLEXERIL) 5 MG tablet Take 5 mg by mouth 2 (two) times daily.    Historical Provider, MD  fluticasone (FLONASE) 50 MCG/ACT nasal spray Place 1 spray into both nostrils daily.    Historical Provider, MD  levothyroxine (SYNTHROID, LEVOTHROID) 75 MCG tablet Take 75 mcg by mouth daily before breakfast.    Historical Provider, MD  lisinopril (PRINIVIL,ZESTRIL) 20 MG tablet Take 20 mg by mouth 2 (two) times daily.    Historical Provider, MD  loperamide (IMODIUM) 2 MG capsule Take 2 mg by mouth 2 (two)  times daily with a meal.    Historical Provider, MD  loratadine (CLARITIN) 10 MG tablet Take 10 mg by mouth daily.    Historical Provider, MD  losartan (COZAAR) 25 MG tablet Take 25 mg by mouth daily.    Historical Provider, MD  Multiple Vitamin (MULTIVITAMIN WITH MINERALS) TABS tablet Take 1 tablet by mouth daily.    Historical Provider, MD  nitroGLYCERIN (NITROSTAT) 0.4 MG SL tablet Place 0.4 mg under the tongue every 5 (five) minutes as needed for chest pain.    Historical Provider, MD  omega-3 acid ethyl esters (LOVAZA) 1 G capsule Take 1 g by mouth daily.    Historical Provider, MD  pravastatin (PRAVACHOL) 20 MG tablet Take 20 mg by mouth daily.    Historical Provider, MD  Propylene Glycol (SYSTANE BALANCE OP) Apply 1 drop to eye daily. Place 1 drop in each eye, every day     Historical Provider, MD  risperiDONE (RISPERDAL) 0.5 MG tablet Take 0.5 mg by mouth 2 (two) times daily.    Historical Provider, MD  simvastatin (ZOCOR) 40 MG tablet Take 40 mg by mouth daily. At bedtime    Historical Provider, MD  temazepam (RESTORIL) 15 MG capsule Take 15 mg by mouth at bedtime as needed for sleep.    Historical Provider, MD   BP 111/50 mmHg  Pulse 52  Temp(Src) 97.8 F (36.6 C) (Oral)  Resp 18  Wt 149 lb (67.586 kg)  SpO2 94%  Vitals reviewed Physical Exam  Physical Examination: General appearance - alert, well appearing, and in no distress Mental status - alert, oriented to person, place, and time Eyes - no conjunctival injection, no scleral icterus Neck - no midline tenderness to palpation Chest - clear to auscultation, no wheezes, rales or rhonchi, symmetric air entry Back exam - no midline tenderness to palpation of thoracic or lumbar spine, no CVA tenderness Neurological - alert, oriented, normal speech, strength 5/5 in extremities x 4, sensation intact, gait normal for her baseline gait with walker Musculoskeletal - no joint tenderness, deformity or swelling, FROM of hip/knees, ankles without pain, FROM of shoulders, elbows, wrists without pain Extremities - peripheral pulses normal, no pedal edema, no clubbing or cyanosis Skin - normal coloration and turgor, no rashes, small amount of bruising overlying both knees  ED Course  Procedures (including critical care time) Labs Review Labs Reviewed - No data to display  Imaging Review Dg Knee Complete 4 Views Left  06/09/2014   CLINICAL DATA:  Initial encounter for fall onto knees. Bilateral knee pain.  EXAM: LEFT KNEE - COMPLETE 4+ VIEW  COMPARISON:  None.  FINDINGS: Four views study shows no evidence for fracture. No subluxation or dislocation. No joint effusion. Loss of joint space noted in the lateral compartment. There is advanced hypertrophic spurring in all 3 compartments.  IMPRESSION: Advanced  tricompartmental degenerative changes. No acute bony abnormality.   Electronically Signed   By: Misty Stanley M.D.   On: 06/09/2014 16:22   Dg Knee Complete 4 Views Right  06/09/2014   CLINICAL DATA:  Right knee pain after fall.  EXAM: RIGHT KNEE - COMPLETE 4+ VIEW  COMPARISON:  Right femur and tibia/fibula views 02/20/2011  FINDINGS: No fracture or dislocation. There is tricompartmental osteoarthritis with peripheral osteophytes that appears progressed from prior exam. Amorphous well corticated calcific densities are seen adjacent to the lateral patella, increased from prior, enthesopathy versus heterotopic calcification. No joint effusion.  IMPRESSION: No fracture or dislocation of the right knee. Progressive  osteoarthritis.   Electronically Signed   By: Jeb Levering M.D.   On: 06/09/2014 16:25     EKG Interpretation None      MDM   Final diagnoses:  Fall, initial encounter  Fall    Pt s/p fall, ambulatory with walker at her baseline.  Mild contusions overlying both knees- xrays requested per family member, doubt fracture or dislocation.  xrays show continued osteoarthritis.  Discharged with strict return precautions.  Pt agreeable with plan.   Xray images reviewed and interpreted by me as well.  Nursing notes including past medical history and social history reviewed and considered in documentation     Threasa Beards, MD 06/10/14 [phone]

## 2014-06-09 NOTE — ED Notes (Signed)
Unwitnessed fall this am. No complaints. She lives in a group home and needs evaluation. Ambulatory with a walker.

## 2014-06-09 NOTE — Discharge Instructions (Signed)
Return to the ED with any concerns including difficulty walking, weakness of legs, seizure activity, vomiting and not able to keep down liquids, decreased level of alertness/lethargy, or any other alarming symptoms

## 2014-06-13 DIAGNOSIS — R269 Unspecified abnormalities of gait and mobility: Secondary | ICD-10-CM | POA: Diagnosis not present

## 2014-06-13 DIAGNOSIS — Z79899 Other long term (current) drug therapy: Secondary | ICD-10-CM | POA: Diagnosis not present

## 2014-06-13 DIAGNOSIS — F79 Unspecified intellectual disabilities: Secondary | ICD-10-CM | POA: Diagnosis not present

## 2014-06-16 DIAGNOSIS — M25569 Pain in unspecified knee: Secondary | ICD-10-CM | POA: Diagnosis not present

## 2014-06-16 DIAGNOSIS — R269 Unspecified abnormalities of gait and mobility: Secondary | ICD-10-CM | POA: Diagnosis not present

## 2014-06-16 DIAGNOSIS — F79 Unspecified intellectual disabilities: Secondary | ICD-10-CM | POA: Diagnosis not present

## 2014-06-16 DIAGNOSIS — R296 Repeated falls: Secondary | ICD-10-CM | POA: Diagnosis not present

## 2014-06-23 DIAGNOSIS — R200 Anesthesia of skin: Secondary | ICD-10-CM | POA: Diagnosis not present

## 2014-06-24 DIAGNOSIS — E748 Other specified disorders of carbohydrate metabolism: Secondary | ICD-10-CM | POA: Diagnosis not present

## 2014-06-29 DIAGNOSIS — R209 Unspecified disturbances of skin sensation: Secondary | ICD-10-CM | POA: Diagnosis not present

## 2014-06-29 DIAGNOSIS — I998 Other disorder of circulatory system: Secondary | ICD-10-CM | POA: Diagnosis not present

## 2014-06-29 DIAGNOSIS — F79 Unspecified intellectual disabilities: Secondary | ICD-10-CM | POA: Diagnosis not present

## 2014-06-29 DIAGNOSIS — E039 Hypothyroidism, unspecified: Secondary | ICD-10-CM | POA: Diagnosis not present

## 2014-06-30 DIAGNOSIS — M79675 Pain in left toe(s): Secondary | ICD-10-CM | POA: Diagnosis not present

## 2014-06-30 DIAGNOSIS — M79674 Pain in right toe(s): Secondary | ICD-10-CM | POA: Diagnosis not present

## 2014-06-30 DIAGNOSIS — B351 Tinea unguium: Secondary | ICD-10-CM | POA: Diagnosis not present

## 2014-07-06 DIAGNOSIS — D688 Other specified coagulation defects: Secondary | ICD-10-CM | POA: Diagnosis not present

## 2014-07-06 DIAGNOSIS — Z79899 Other long term (current) drug therapy: Secondary | ICD-10-CM | POA: Diagnosis not present

## 2014-07-06 DIAGNOSIS — Z136 Encounter for screening for cardiovascular disorders: Secondary | ICD-10-CM | POA: Diagnosis not present

## 2014-07-07 DIAGNOSIS — Z1231 Encounter for screening mammogram for malignant neoplasm of breast: Secondary | ICD-10-CM | POA: Diagnosis not present

## 2014-07-27 DIAGNOSIS — E7409 Other glycogen storage disease: Secondary | ICD-10-CM | POA: Diagnosis not present

## 2014-07-27 DIAGNOSIS — F79 Unspecified intellectual disabilities: Secondary | ICD-10-CM | POA: Diagnosis not present

## 2014-07-27 DIAGNOSIS — R238 Other skin changes: Secondary | ICD-10-CM | POA: Diagnosis not present

## 2014-08-11 DIAGNOSIS — F79 Unspecified intellectual disabilities: Secondary | ICD-10-CM | POA: Diagnosis not present

## 2014-08-11 DIAGNOSIS — B354 Tinea corporis: Secondary | ICD-10-CM | POA: Diagnosis not present

## 2014-08-11 DIAGNOSIS — F329 Major depressive disorder, single episode, unspecified: Secondary | ICD-10-CM | POA: Diagnosis not present

## 2014-08-11 DIAGNOSIS — R55 Syncope and collapse: Secondary | ICD-10-CM | POA: Diagnosis not present

## 2014-08-11 DIAGNOSIS — Q909 Down syndrome, unspecified: Secondary | ICD-10-CM | POA: Diagnosis not present

## 2014-08-24 DIAGNOSIS — B49 Unspecified mycosis: Secondary | ICD-10-CM | POA: Diagnosis not present

## 2014-08-24 DIAGNOSIS — F79 Unspecified intellectual disabilities: Secondary | ICD-10-CM | POA: Diagnosis not present

## 2014-09-01 DIAGNOSIS — F79 Unspecified intellectual disabilities: Secondary | ICD-10-CM | POA: Diagnosis not present

## 2014-09-01 DIAGNOSIS — Z79899 Other long term (current) drug therapy: Secondary | ICD-10-CM | POA: Diagnosis not present

## 2014-09-01 DIAGNOSIS — F328 Other depressive episodes: Secondary | ICD-10-CM | POA: Diagnosis not present

## 2014-09-22 DIAGNOSIS — M79675 Pain in left toe(s): Secondary | ICD-10-CM | POA: Diagnosis not present

## 2014-09-22 DIAGNOSIS — B351 Tinea unguium: Secondary | ICD-10-CM | POA: Diagnosis not present

## 2014-09-22 DIAGNOSIS — M79674 Pain in right toe(s): Secondary | ICD-10-CM | POA: Diagnosis not present

## 2014-10-09 ENCOUNTER — Emergency Department (HOSPITAL_COMMUNITY)
Admission: EM | Admit: 2014-10-09 | Discharge: 2014-10-09 | Disposition: A | Discharge status: Home / Self Care | Payer: Medicare Other | Attending: Emergency Medicine | Admitting: Emergency Medicine

## 2014-10-09 ENCOUNTER — Encounter (HOSPITAL_COMMUNITY): Payer: Self-pay | Admitting: Emergency Medicine

## 2014-10-09 DIAGNOSIS — Z7982 Long term (current) use of aspirin: Secondary | ICD-10-CM | POA: Insufficient documentation

## 2014-10-09 DIAGNOSIS — E039 Hypothyroidism, unspecified: Secondary | ICD-10-CM | POA: Diagnosis not present

## 2014-10-09 DIAGNOSIS — Z8673 Personal history of transient ischemic attack (TIA), and cerebral infarction without residual deficits: Secondary | ICD-10-CM | POA: Diagnosis not present

## 2014-10-09 DIAGNOSIS — E785 Hyperlipidemia, unspecified: Secondary | ICD-10-CM | POA: Insufficient documentation

## 2014-10-09 DIAGNOSIS — R404 Transient alteration of awareness: Secondary | ICD-10-CM | POA: Diagnosis not present

## 2014-10-09 DIAGNOSIS — R55 Syncope and collapse: Secondary | ICD-10-CM

## 2014-10-09 DIAGNOSIS — Z8669 Personal history of other diseases of the nervous system and sense organs: Secondary | ICD-10-CM | POA: Insufficient documentation

## 2014-10-09 DIAGNOSIS — Z79899 Other long term (current) drug therapy: Secondary | ICD-10-CM | POA: Insufficient documentation

## 2014-10-09 DIAGNOSIS — F71 Moderate intellectual disabilities: Secondary | ICD-10-CM | POA: Insufficient documentation

## 2014-10-09 DIAGNOSIS — Q909 Down syndrome, unspecified: Secondary | ICD-10-CM | POA: Insufficient documentation

## 2014-10-09 DIAGNOSIS — I10 Essential (primary) hypertension: Secondary | ICD-10-CM | POA: Insufficient documentation

## 2014-10-09 LAB — CBC WITH DIFFERENTIAL/PLATELET
Basophils Absolute: 0.1 10*3/uL (ref 0.0–0.1)
Basophils Relative: 2 % — ABNORMAL HIGH (ref 0–1)
EOS ABS: 0.1 10*3/uL (ref 0.0–0.7)
Eosinophils Relative: 2 % (ref 0–5)
HCT: 41.6 % (ref 36.0–46.0)
Hemoglobin: 13.7 g/dL (ref 12.0–15.0)
LYMPHS ABS: 1.1 10*3/uL (ref 0.7–4.0)
LYMPHS PCT: 29 % (ref 12–46)
MCH: 31.4 pg (ref 26.0–34.0)
MCHC: 32.9 g/dL (ref 30.0–36.0)
MCV: 95.4 fL (ref 78.0–100.0)
MONOS PCT: 8 % (ref 3–12)
Monocytes Absolute: 0.3 10*3/uL (ref 0.1–1.0)
NEUTROS ABS: 2.4 10*3/uL (ref 1.7–7.7)
NEUTROS PCT: 59 % (ref 43–77)
PLATELETS: 226 10*3/uL (ref 150–400)
RBC: 4.36 MIL/uL (ref 3.87–5.11)
RDW: 14.1 % (ref 11.5–15.5)
WBC: 3.9 10*3/uL — ABNORMAL LOW (ref 4.0–10.5)

## 2014-10-09 LAB — URINALYSIS, ROUTINE W REFLEX MICROSCOPIC
BILIRUBIN URINE: NEGATIVE
Glucose, UA: NEGATIVE mg/dL
KETONES UR: NEGATIVE mg/dL
NITRITE: NEGATIVE
PROTEIN: 30 mg/dL — AB
Specific Gravity, Urine: 1.019 (ref 1.005–1.030)
UROBILINOGEN UA: 0.2 mg/dL (ref 0.0–1.0)
pH: 7 (ref 5.0–8.0)

## 2014-10-09 LAB — BASIC METABOLIC PANEL
ANION GAP: 2 — AB (ref 5–15)
BUN: 17 mg/dL (ref 6–20)
CHLORIDE: 110 mmol/L (ref 101–111)
CO2: 27 mmol/L (ref 22–32)
Calcium: 8.4 mg/dL — ABNORMAL LOW (ref 8.9–10.3)
Creatinine, Ser: 1.11 mg/dL — ABNORMAL HIGH (ref 0.44–1.00)
GFR calc non Af Amer: 54 mL/min — ABNORMAL LOW (ref >60)
Glucose, Bld: 99 mg/dL (ref 70–99)
Potassium: 4 mmol/L (ref 3.5–5.1)
SODIUM: 139 mmol/L (ref 135–145)

## 2014-10-09 LAB — URINE MICROSCOPIC-ADD ON

## 2014-10-09 NOTE — Discharge Instructions (Signed)
Blood work and EKG were normal. Urine culture is pending. We will call you if culture comes back abnormal.

## 2014-10-09 NOTE — ED Notes (Signed)
Pt arrived via EMS with report of pt having syncope episode lasting approx 1 min. while getting ready for church at [place]. Pt did hit her head while being lower to floor.  Pt awake upon arrival to facility and ambulated to BR with assistance. Pt reported having headache but denies blurred vision. Bil feet discoloration with (+)PMS, CRT brisk. No injury/deformity noted.

## 2014-10-09 NOTE — ED Provider Notes (Signed)
CSN: [id number]     Arrival date & time 10/09/14  1110 History   First MD Initiated Contact with Patient 10/09/14 1117     Chief Complaint  Patient presents with  . Loss of Consciousness     (Consider location/radiation/quality/duration/timing/severity/associated sxs/prior Treatment) HPI.... Level V caveat for Down syndrome.   Patient was leaning over the sink when she "passed out".  Sister reports her eyes rolled back in the head and she was eased down to the floor. No head trauma. This is happened approximately 4 times in the past 2-3 years. She has had an extensive seizure workup for which there has been no obvious findings. Symptoms spontaneously cleared. No tonic-clonic activity. No meningeal signs. No chest pain or dyspnea. Sister reports she is back to normal.  Past Medical History  Diagnosis Date  . Hyperlipidemia   . Allergy   . Hypothyroidism   . Depression   . Down's syndrome   . Stroke     in 2006  . PVD (peripheral vascular disease)   . Moderate intellectual disability   . Hallucinations   . Sleep apnea   . Hypertension    Past Surgical History  Procedure Laterality Date  . Orif lt foot    . Tonsillectomy    . Colonoscopy  06/12/2012   Family History  Problem Relation Age of Onset  . Colon cancer Paternal Uncle   . Colon cancer Maternal Grandfather    History  Substance Use Topics  . Smoking status: Never Smoker   . Smokeless tobacco: Never Used  . Alcohol Use: No   OB History    No data available     Review of Systems  All other systems reviewed and are negative.     Allergies  Review of patient's allergies indicates no known allergies.  Home Medications   Prior to Admission medications   Medication Sig Start Date End Date Taking? Authorizing Provider  aspirin 81 MG tablet Take 81 mg by mouth daily.   Yes Historical Provider, MD  calcium-vitamin D (OSCAL WITH D) 500-200 MG-UNIT per tablet Take 1 tablet by mouth 2 (two) times daily.   Yes  Historical Provider, MD  cholecalciferol (VITAMIN D) 1000 UNITS tablet Take 1,000 Units by mouth daily.   Yes Historical Provider, MD  docusate sodium (COLACE) 100 MG capsule Take 100 mg by mouth 2 (two) times daily.   Yes Historical Provider, MD  levothyroxine (SYNTHROID, LEVOTHROID) 75 MCG tablet Take 75 mcg by mouth daily before breakfast.   Yes Historical Provider, MD  loratadine (CLARITIN) 10 MG tablet Take 10 mg by mouth daily.   Yes Historical Provider, MD  losartan (COZAAR) 25 MG tablet Take 12.5 mg by mouth daily.    Yes Historical Provider, MD  omega-3 acid ethyl esters (LOVAZA) 1 G capsule Take 1 g by mouth daily.   Yes Historical Provider, MD  Propylene Glycol (SYSTANE BALANCE OP) Apply 1 drop to eye daily. Place 1 drop in each eye, every day   Yes Historical Provider, MD  simvastatin (ZOCOR) 40 MG tablet Take 40 mg by mouth at bedtime. At bedtime   Yes Historical Provider, MD  sodium chloride (OCEAN) 0.65 % SOLN nasal spray Place 1 spray into both nostrils as needed for congestion.   Yes Historical Provider, MD   BP 95/62 mmHg  Pulse 38  Temp(Src) 97.8 F (36.6 C) (Oral)  Resp 11  SpO2 100% Physical Exam  Constitutional: She is oriented to person, place, and time. She  appears well-developed and well-nourished.  Body habitus of Down syndrome  HENT:  Head: Normocephalic and atraumatic.  Eyes: Conjunctivae and EOM are normal. Pupils are equal, round, and reactive to light.  Neck: Normal range of motion. Neck supple.  Cardiovascular: Normal rate and regular rhythm.   Pulmonary/Chest: Effort normal and breath sounds normal.  Abdominal: Soft. Bowel sounds are normal.  Musculoskeletal: Normal range of motion.  Neurological: She is alert and oriented to person, place, and time.  Skin: Skin is warm and dry.  Psychiatric: She has a normal mood and affect. Her behavior is normal.  Nursing note and vitals reviewed.   ED Course  Procedures (including critical care time) Labs  Review Labs Reviewed  CBC WITH DIFFERENTIAL/PLATELET - Abnormal; Notable for the following:    WBC 3.9 (*)    Basophils Relative 2 (*)    All other components within normal limits  BASIC METABOLIC PANEL - Abnormal; Notable for the following:    Creatinine, Ser 1.11 (*)    Calcium 8.4 (*)    GFR calc non Af Amer 54 (*)    Anion gap 2 (*)    All other components within normal limits  URINALYSIS, ROUTINE W REFLEX MICROSCOPIC - Abnormal; Notable for the following:    APPearance CLOUDY (*)    Hgb urine dipstick TRACE (*)    Protein, ur 30 (*)    Leukocytes, UA MODERATE (*)    All other components within normal limits  URINE MICROSCOPIC-ADD ON - Abnormal; Notable for the following:    Bacteria, UA MANY (*)    All other components within normal limits  URINE CULTURE    Imaging Review No results found.   EKG Interpretation   Date/Time:  Sunday Oct 09 2014 12:09:00 EDT Ventricular Rate:  50 PR Interval:  149 QRS Duration: 78 QT Interval:  484 QTC Calculation: 441 R Axis:   76 Text Interpretation:  Sinus rhythm Consider left ventricular hypertrophy  Confirmed by Lacinda Axon  MD, Brandan Glauber ([id number]) on 10/09/2014 12:24:38 PM      MDM   Final diagnoses:  Syncope, unspecified syncope type  Down syndrome    Sister confirms normal behavior. EKG and labs within normal limits. Urine culture pending. Patient has had extensive seizure workup. I do not think this was a seizure, but some form of a syncopal episode. She has primary care follow-up.    Nat Christen, MD 10/09/14 1450

## 2014-10-09 NOTE — ED Notes (Signed)
Bed: WA03 Expected date:  Expected time:  Means of arrival:  Comments: EMS-syncope 

## 2014-10-12 LAB — URINE CULTURE: Colony Count: 40000

## 2014-10-13 ENCOUNTER — Telehealth: Payer: Self-pay | Admitting: Emergency Medicine

## 2014-10-13 NOTE — Telephone Encounter (Signed)
Post ED Visit - Positive Culture Follow-up  Culture report reviewed by antimicrobial stewardship pharmacist: []  Wes [place], Pharm.D., BCPS []  Heide Guile, Pharm.D., BCPS []  Alycia Rossetti, Pharm.D., BCPS []  [place], Pharm.D., BCPS, AAHIVP []  Legrand Como, Pharm.D., BCPS, AAHIVP []  Tegan Magsam, Pharm.D.,   Positive Urine culture No treatment per [facility]  Ernesta Amble 10/13/2014, 12:33 PM

## 2014-10-15 ENCOUNTER — Emergency Department (HOSPITAL_COMMUNITY)
Admission: EM | Admit: 2014-10-15 | Discharge: 2014-10-15 | Disposition: A | Discharge status: Home / Self Care | Payer: Medicare Other | Attending: Emergency Medicine | Admitting: Emergency Medicine

## 2014-10-15 ENCOUNTER — Encounter (HOSPITAL_COMMUNITY): Payer: Self-pay | Admitting: Emergency Medicine

## 2014-10-15 DIAGNOSIS — I10 Essential (primary) hypertension: Secondary | ICD-10-CM | POA: Insufficient documentation

## 2014-10-15 DIAGNOSIS — Z8669 Personal history of other diseases of the nervous system and sense organs: Secondary | ICD-10-CM | POA: Insufficient documentation

## 2014-10-15 DIAGNOSIS — J029 Acute pharyngitis, unspecified: Secondary | ICD-10-CM | POA: Diagnosis not present

## 2014-10-15 DIAGNOSIS — Z0000 Encounter for general adult medical examination without abnormal findings: Secondary | ICD-10-CM | POA: Insufficient documentation

## 2014-10-15 DIAGNOSIS — E785 Hyperlipidemia, unspecified: Secondary | ICD-10-CM | POA: Insufficient documentation

## 2014-10-15 DIAGNOSIS — I739 Peripheral vascular disease, unspecified: Secondary | ICD-10-CM | POA: Diagnosis not present

## 2014-10-15 DIAGNOSIS — Z8659 Personal history of other mental and behavioral disorders: Secondary | ICD-10-CM | POA: Diagnosis not present

## 2014-10-15 DIAGNOSIS — Z79899 Other long term (current) drug therapy: Secondary | ICD-10-CM | POA: Insufficient documentation

## 2014-10-15 DIAGNOSIS — Z8673 Personal history of transient ischemic attack (TIA), and cerebral infarction without residual deficits: Secondary | ICD-10-CM | POA: Diagnosis not present

## 2014-10-15 DIAGNOSIS — Z7982 Long term (current) use of aspirin: Secondary | ICD-10-CM | POA: Diagnosis not present

## 2014-10-15 DIAGNOSIS — Q909 Down syndrome, unspecified: Secondary | ICD-10-CM | POA: Diagnosis not present

## 2014-10-15 DIAGNOSIS — E039 Hypothyroidism, unspecified: Secondary | ICD-10-CM | POA: Insufficient documentation

## 2014-10-15 NOTE — ED Notes (Signed)
Pt from home  With caregiver and sister. Per sister earlier this a.m. Pt choked on a fish oil pill, She spit pill out but now has sore throat. No issues since and patient has been able to eat without difficulty.

## 2014-10-15 NOTE — ED Provider Notes (Signed)
CSN: [id number]     Arrival date & time 10/15/14  2108 History  This chart was scribed for Clemens Catholic, NP working with Sherwood Gambler, MD by Mercy Moore, ED Scribe. This patient was seen in room WTR5/WTR5 and the patient's care was started at 10:38 PM.   Chief Complaint  Patient presents with  . Sore Throat   The history is provided by the patient. No language interpreter was used.   HPI Comments: Amanda Castillo is a 59 y.o. female who presents to the Emergency Department complaining of sore throat after briefly choking on large fish oil pill yesterday. Patient was able to spit the pill out. Caregiver states that a hold has been placed and she has not taken the pill today nor will she take it tomorrow. Sister reports that she took the patient to breakfast this morning and she was able to eat and drink without any difficulty. Patient's sister states that she received a call from the administer at her group home facility informing her that her sister had been transported to [facility] for imaging. Caregiver denies trouble swallowing or tolerating solids or liquids.   Past Medical History  Diagnosis Date  . Hyperlipidemia   . Allergy   . Hypothyroidism   . Depression   . Down's syndrome   . Stroke     in 2006  . PVD (peripheral vascular disease)   . Moderate intellectual disability   . Hallucinations   . Sleep apnea   . Hypertension    Past Surgical History  Procedure Laterality Date  . Orif lt foot    . Tonsillectomy    . Colonoscopy  06/12/2012   Family History  Problem Relation Age of Onset  . Colon cancer Paternal Uncle   . Colon cancer Maternal Grandfather    History  Substance Use Topics  . Smoking status: Never Smoker   . Smokeless tobacco: Never Used  . Alcohol Use: No   OB History    No data available     Review of Systems  Constitutional: Negative for fever and chills.  HENT: Negative for sore throat and trouble swallowing.        Report of difficulty swallowing  pill  Respiratory: Negative for cough and shortness of breath.     Allergies  Review of patient's allergies indicates no known allergies.  Home Medications   Prior to Admission medications   Medication Sig Start Date End Date Taking? Authorizing Provider  aspirin 81 MG tablet Take 81 mg by mouth daily.   Yes Historical Provider, MD  cholecalciferol (VITAMIN D) 1000 UNITS tablet Take 1,000 Units by mouth daily.   Yes Historical Provider, MD  docusate sodium (COLACE) 100 MG capsule Take 100 mg by mouth 2 (two) times daily.   Yes Historical Provider, MD  levothyroxine (SYNTHROID, LEVOTHROID) 75 MCG tablet Take 75 mcg by mouth daily before breakfast.   Yes Historical Provider, MD  loratadine (CLARITIN) 10 MG tablet Take 10 mg by mouth daily.   Yes Historical Provider, MD  losartan (COZAAR) 25 MG tablet Take 12.5 mg by mouth daily.    Yes Historical Provider, MD  omega-3 acid ethyl esters (LOVAZA) 1 G capsule Take 1 g by mouth daily.   Yes Historical Provider, MD  Propylene Glycol (SYSTANE BALANCE OP) Apply 1 drop to eye daily. Place 1 drop in each eye, every day   Yes Historical Provider, MD  simvastatin (ZOCOR) 40 MG tablet Take 40 mg by mouth at bedtime. At  bedtime   Yes Historical Provider, MD  sodium chloride (OCEAN) 0.65 % SOLN nasal spray Place 1 spray into both nostrils as needed for congestion.   Yes Historical Provider, MD   Triage Vitals: BP 106/58 mmHg  Pulse 67  Temp(Src) 98.6 F (37 C) (Oral)  Resp 18  SpO2 98% Physical Exam  Constitutional: She is oriented to person, place, and time. She appears well-developed and well-nourished. No distress.  HENT:  Head: Normocephalic and atraumatic.  Mouth/Throat: Oropharynx is clear and moist and mucous membranes are normal. No oropharyngeal exudate, posterior oropharyngeal edema, posterior oropharyngeal erythema or tonsillar abscesses.  Normal appearance to posterior oropharyngeal structures  Eyes: Conjunctivae and EOM are normal.   Neck: Normal range of motion. Neck supple. No tracheal deviation present. No thyromegaly present.  Cardiovascular: Normal rate, regular rhythm and intact distal pulses.   Pulmonary/Chest: Effort normal and breath sounds normal. No stridor. No respiratory distress. She has no wheezes. She has no rales. She exhibits no tenderness.  Abdominal: Soft. There is no tenderness.  Musculoskeletal: Normal range of motion. She exhibits no tenderness.  Lymphadenopathy:    She has no cervical adenopathy.  Neurological: She is alert and oriented to person, place, and time.  Skin: Skin is warm and dry. No rash noted. She is not diaphoretic.  Psychiatric: She has a normal mood and affect. Her behavior is normal.  Nursing note and vitals reviewed.   ED Course  Procedures (including critical care time)  COORDINATION OF CARE: 10:46 PM- Discussed treatment plan with patient at bedside and patient agreed to plan.   Labs Review Labs Reviewed - No data to display  Imaging Review No results found.   EKG Interpretation None      MDM   Final diagnoses:  Normal physical exam   59 yo with down syndrome, presents at request of nursing facility for x-ray of her neck because she had difficulty swallowing a pill.  The pt is well appearing and has a normal posterior oropharyngeal exam and no indication for any type of x-ray. Since the pill swallowing episode she has eaten a large meal at  [facility] with her sister without any difficulty.  Pt is well-appearing, in no acute distress and vital signs reviewed and not concerning. She tolerated POs well in the ED. She appears safe to be discharged.  Discharge include follow-up with their PCP.  Return precautions provided. Pt aware of plan and in agreement.   I personally performed the services described in this documentation, which was scribed in my presence. The recorded information has been reviewed and is accurate.  Filed Vitals:   10/15/14 2152  BP: 106/58   Pulse: 67  Temp: 98.6 F (37 C)  TempSrc: Oral  Resp: 18  SpO2: 98%   Meds given in ED:  Medications - No data to display  Discharge Medication List as of 10/15/2014 11:07 PM         [name], NP 10/16/14 1929  Sherwood Gambler, MD 10/18/14 1530

## 2014-10-15 NOTE — Discharge Instructions (Signed)
Your exam today is normal and not concerning for any retained foreign body in your throat or airway. Be sure to follow-up with your primary care doctor to make sure you're doing well. Don't hesitate to return however for any new, worsening, or concerning symptoms.   SEEK MEDICAL CARE IF:  You develop worsening shortness of breath, uncontrollable coughing, chest pains or high fever, greater than 102 F (38.9 C).  You are unable to eat or drink or you feel that food is getting stuck in your throat.  You have choking symptoms or cannot stop drooling.  You develop abdominal pain, vomiting (especially of blood), or rectal bleeding.

## 2014-10-19 DIAGNOSIS — J309 Allergic rhinitis, unspecified: Secondary | ICD-10-CM | POA: Diagnosis not present

## 2014-10-19 DIAGNOSIS — F79 Unspecified intellectual disabilities: Secondary | ICD-10-CM | POA: Diagnosis not present

## 2014-10-19 DIAGNOSIS — R1310 Dysphagia, unspecified: Secondary | ICD-10-CM | POA: Diagnosis not present

## 2014-10-21 ENCOUNTER — Other Ambulatory Visit (HOSPITAL_COMMUNITY): Payer: Self-pay | Admitting: Specialist

## 2014-10-21 DIAGNOSIS — T17308A Unspecified foreign body in larynx causing other injury, initial encounter: Secondary | ICD-10-CM

## 2014-10-26 ENCOUNTER — Ambulatory Visit: Payer: Federal, State, Local not specified - PPO | Admitting: Neurology

## 2014-10-26 ENCOUNTER — Ambulatory Visit (HOSPITAL_COMMUNITY): Payer: Federal, State, Local not specified - PPO

## 2014-10-26 ENCOUNTER — Other Ambulatory Visit (HOSPITAL_COMMUNITY): Payer: Federal, State, Local not specified - PPO

## 2014-11-09 ENCOUNTER — Ambulatory Visit: Payer: Federal, State, Local not specified - PPO | Admitting: Neurology

## 2014-11-09 ENCOUNTER — Encounter: Payer: Self-pay | Admitting: Neurology

## 2014-11-09 ENCOUNTER — Ambulatory Visit (INDEPENDENT_AMBULATORY_CARE_PROVIDER_SITE_OTHER): Payer: Medicare Other | Admitting: Neurology

## 2014-11-09 VITALS — BP 92/60 | HR 92 | Ht 59.0 in | Wt 133.0 lb

## 2014-11-09 DIAGNOSIS — Q909 Down syndrome, unspecified: Secondary | ICD-10-CM

## 2014-11-09 DIAGNOSIS — R55 Syncope and collapse: Secondary | ICD-10-CM

## 2014-11-09 NOTE — Patient Instructions (Addendum)
1. Refer to Cardiology for recurrent syncope. Referral placed and they will call directly with an appt. They can be reached at [phone] if you do not hear from them.  2. Follow-up in 6 months or earlier if needed 3. Speak to PCP regarding need for oxygen at night

## 2014-11-09 NOTE — Progress Notes (Signed)
NEUROLOGY FOLLOW UP OFFICE NOTE  Amanda Castillo [id number]  HISTORY OF PRESENT ILLNESS: I had the pleasure of seeing Amanda Castillo in follow-up in the neurology clinic on 11/09/2014.  The patient was last seen 7 months ago after an episode of unresponsiveness in September 2015. She is again accompanied by her sister and new home health staff today who help supplement the history. Records and images were personally reviewed where available.  Since her last visit, she has had another episode of loss of consciousness. Her sister is very vigilant of her symptoms and environment, and reports that she was under a strict regimen in her previous group home. She was with her sister in the bathroom on 10/09/14, with her neck extended as her sister was plucking her chin hairs. She then started having a blank look and then passed out. She turned grayish blue, her sister helped her down to the floor, where she stiffened with eyes rolled up and stopped breathing. Her sister did CPR with 3 chest compressions then she woke up with no post-event confusion.   Aside from this, no other falls. She ambulates with a walker on long distances, but her sister reports this is because a former housemate had convinced [name] that she needs a walker. Her sister is also concerned about sleep apnea, she did not tolerate CPAP mask, and asks about O2 nasal cannula at night.   HPI: This is a pleasant 59 yo woman with a history of Down syndrome with cognitive impairment, hypertension, hyperlipidemia, left frontal stroke in 2003, with recurrent episodes of loss of consciousness. She presented initially after an episode of unresponsiveness last 02/22/2014. Per group home staff, [name] was being assisted in the bathroom, sitting on the commode when she blanked out. Staff was trying to hold her up. Per ER notes, she was standing and developed shaking of both arm and legs. They sat her down on the toilet and she was shaking for about 1  minute. They noted she was nonverbal. She had a mild abrasion in her inner lip that appeared to be new per facility. She complained of a mild headache in the ER but was back to baseline. No recent infections. Glucose normal per EMS. Her CBC showed a WBC of 13.2, otherwise normal. CMP normal. She had a head CT without contrast which I personally reviewed, no acute changes, there was an old left frontal infarct. Her sister reports that this is the third episode of loss of consciousness, however she has no prior history of seizures. The first episode occurred in 2013 when she became unresponsive briefly. The second occurred in 05/2013, she was in the bathroom and lost consciousness while washing her face. With the stroke in 2003, she had been living with her mother and was noted to have a faraway look then fell to her knees and started shaking, all she could say was "mommy, mommy."   There is no prior history of seizures. Her sister and staff have not noticed any other episodes of staring/unresponsiveness or myoclonic jerks. Her sister states that [name] was moved to a new group home and has not been doing well there, with a change in behavior. She would mimic her co-residents, and now makes different facial expressions and mouth movements. Group home staff stated that they do not notice this change in behavior themselves. [name] has some difficulty expressing herself, denying any headaches, dizziness, diplopia, dysarthria, dysphagia, neck/back pain, focal numbness/tingling/weakness.  According to her sister, [name] had an uneventful delivery,  delayed development with Down syndrome. There is no history of febrile convulsions, CNS infections such as meningitis/encephalitis, significant traumatic brain injury, neurosurgical procedures, or family history of seizures.  Diagnostic Data: EEG 12/29/12: This EEG could be read within normal limits for age and levels of consciousness. There was a 2- second burst of  high voltage slowing which occurred at the end of photic stimulation. It's difficult to know what this means. Overall this was a normal tracing, done in awake without drowsiness or sleep captured. EEG 10/06/13: Ambulatory EEG terminated around the 12th hour. There is left temporal delta slowing seen. No epileptiform discharges seen.  EEG 11/16/13: This EEG is normal for age and levels of consciousness done primarily in awake but in a finely relaxed patient. MRI brain without contrast 03/2014 did not show any acute changes. There was a remote left frontal lobe infarct with ex vacuo dilation of the left lateral ventricle, and a smaller remote right parietal infarct. Mild generalized atrophy.  PAST MEDICAL HISTORY: Past Medical History  Diagnosis Date  . Hyperlipidemia   . Allergy   . Hypothyroidism   . Depression   . Down's syndrome   . Stroke     in 2006  . PVD (peripheral vascular disease)   . Moderate intellectual disability   . Hallucinations   . Sleep apnea   . Hypertension     MEDICATIONS: Current Outpatient Prescriptions on File Prior to Visit  Medication Sig Dispense Refill  . aspirin 81 MG tablet Take 81 mg by mouth daily.    . cholecalciferol (VITAMIN D) 1000 UNITS tablet Take 1,000 Units by mouth daily.    Marland Kitchen docusate sodium (COLACE) 100 MG capsule Take 100 mg by mouth 2 (two) times daily.    Marland Kitchen levothyroxine (SYNTHROID, LEVOTHROID) 75 MCG tablet Take 75 mcg by mouth daily before breakfast.    . loratadine (CLARITIN) 10 MG tablet Take 10 mg by mouth daily.    Marland Kitchen losartan (COZAAR) 25 MG tablet Take 12.5 mg by mouth daily.     Marland Kitchen Propylene Glycol (SYSTANE BALANCE OP) Apply 1 drop to eye daily. Place 1 drop in each eye, every day    . simvastatin (ZOCOR) 40 MG tablet Take 40 mg by mouth at bedtime. At bedtime    . sodium chloride (OCEAN) 0.65 % SOLN nasal spray Place 1 spray into both nostrils as needed for congestion.     No current facility-administered medications on file  prior to visit.    ALLERGIES: No Known Allergies  FAMILY HISTORY: Family History  Problem Relation Age of Onset  . Colon cancer Paternal Uncle   . Colon cancer Maternal Grandfather     SOCIAL HISTORY: History   Social History  . Marital Status: Single    Spouse Name: N/A  . Number of Children: N/A  . Years of Education: N/A   Occupational History  . Not on file.   Social History Main Topics  . Smoking status: Never Smoker   . Smokeless tobacco: Never Used  . Alcohol Use: No  . Drug Use: No  . Sexual Activity: Not on file   Other Topics Concern  . Not on file   Social History Narrative    REVIEW OF SYSTEMS limited due to mental status, patient denies any symptoms as below: Constitutional: No fevers, chills, or sweats, no generalized fatigue, change in appetite Eyes: No visual changes, double vision, eye pain Ear, nose and throat: No hearing loss, ear pain, nasal congestion, sore throat Cardiovascular:  No chest pain, palpitations Respiratory:  No shortness of breath at rest or with exertion, wheezes GastrointestinaI: No nausea, vomiting, diarrhea, abdominal pain, fecal incontinence Genitourinary:  No dysuria, urinary retention or frequency Musculoskeletal:  No neck pain, back pain Integumentary: No rash, pruritus, skin lesions Neurological: as above Psychiatric: No depression, insomnia, anxiety Endocrine: No palpitations, fatigue, diaphoresis, mood swings, change in appetite, change in weight, increased thirst Hematologic/Lymphatic:  No anemia, purpura, petechiae. Allergic/Immunologic: no itchy/runny eyes, nasal congestion, recent allergic reactions, rashes  PHYSICAL EXAM: Filed Vitals:   11/09/14 1140  BP: 92/60  Pulse: 92   General:No acute distress, Down syndrome facies Head: Normocephalic/atraumatic Eyes: Fundoscopic exam shows bilateral sharp discs, no vessel changes, exudates, or hemorrhages Neck: supple, no paraspinal tenderness, full range of  motion Back: No paraspinal tenderness Heart: regular rate and rhythm Lungs: Clear to auscultation bilaterally. Vascular: No carotid bruits. Skin/Extremities: No rash, no edema Neurological Exam: Mental status: alert and oriented to person, place, no dysarthria or aphasia, Fund of knowledge is reduced. Recent and remote memory are impaired, patient mostly says yes to all questions (similar to prior). Attention and concentration are normal. Able to name objects and repeat phrases. Cranial nerves: CN I: not tested CN II: pupils equal, round and reactive to light, visual fields intact, fundi unremarkable. CN III, IV, VI: full range of motion, no nystagmus, no ptosis CN V: facial sensation intact CN VII: upper and lower face symmetric CN VIII: hearing intact to finger rub CN IX, X: uvula midline CN XI: sternocleidomastoid and trapezius muscles intact CN XII: tongue midline Bulk & Tone: hypotonic, no fasciculations. Motor: 5/5 throughout with no pronator drift. Sensation: intact to light touch. No extinction to double simultaneous stimulation. Romberg test negative Deep Tendon Reflexes: +1 throughout, no ankle clonus Plantar responses: downgoing bilaterally Cerebellar: no incoordination on finger to nose testing Gait: slow and cautious, wide-based,unable to tandem walk Tremor: none  IMPRESSION: This is a 59 yo woman with a history of Down syndrome with moderate cognitive impairment, hypertension, hyperlipidemia, and left frontal stroke in 2003 with no residual deficits, with recurrent episodes of loss of consciousness of unclear etiology, syncope versus seizure. MRI brain did not show any new stroke. She does have a history of prior strokes which may predispose her to seizures, however all testing to date have not shown any clear evidence for epilepsy. The most recent episode last 10/09/14 is more suggestive of syncope. She will be referred for cardiac evaluation. No indication for  starting anti-seizure medication at this time. Her sister will discuss question of nocturnal O2 with her PCP. She will follow-up in 6 months or earlier if needed.  Thank you for allowing me to participate in her care.  Please do not hesitate to call for any questions or concerns.  The duration of this appointment visit was 15 minutes of face-to-face time with the patient.  Greater than 50% of this time was spent in counseling, explanation of diagnosis, planning of further management, and coordination of care.   Ellouise Newer, M.D.   CC: Dr. Fredderick Phenix

## 2014-11-14 ENCOUNTER — Ambulatory Visit (INDEPENDENT_AMBULATORY_CARE_PROVIDER_SITE_OTHER): Payer: Medicare Other | Admitting: Cardiology

## 2014-11-14 ENCOUNTER — Encounter: Payer: Self-pay | Admitting: Cardiology

## 2014-11-14 VITALS — BP 102/72 | HR 54 | Ht 59.0 in | Wt 134.0 lb

## 2014-11-14 DIAGNOSIS — I10 Essential (primary) hypertension: Secondary | ICD-10-CM

## 2014-11-14 DIAGNOSIS — G4730 Sleep apnea, unspecified: Secondary | ICD-10-CM | POA: Diagnosis not present

## 2014-11-14 DIAGNOSIS — Q909 Down syndrome, unspecified: Secondary | ICD-10-CM | POA: Insufficient documentation

## 2014-11-14 DIAGNOSIS — R55 Syncope and collapse: Secondary | ICD-10-CM

## 2014-11-14 MED ORDER — DOCUSATE SODIUM 100 MG PO CAPS: 100.0000 mg | ORAL_CAPSULE | Freq: Every day | ORAL | Status: DC | PRN

## 2014-11-14 NOTE — Patient Instructions (Addendum)
Medication Instructions:  Please stop your Losartan. You may take Colace and Miralax as needed. Continue all other medications as listed.  Testing/Procedures: Your physician has requested that you have an echocardiogram. Echocardiography is a painless test that uses sound waves to create images of your heart. It provides your doctor with information about the size and shape of your heart and how well your heart's chambers and valves are working. This procedure takes approximately one hour. There are no restrictions for this procedure.  Follow-Up: Follow up in 3 months with Dr Marlou Porch.  Thank you for choosing [facility]!!

## 2014-11-14 NOTE — Progress Notes (Signed)
Cardiology Office Note   Date:  11/14/2014   ID:  [name], DOB 1956/01/29, MRN [id number]  PCP:  Reymundo Poll, MD  Cardiologist:   Candee Furbish, MD       History of Present Illness: Amanda Castillo is a 59 y.o. female who presents for evaluation of syncope. She was seen in the neurology clinic. She had an episode of unresponsiveness in September 2015. Lives in a group home, Down syndrome. She was with her sister with her neck extended as her sister was plucking her hair. She then started having blank look and passed out. They remember her turning grayish blue. She stiffened up, eyes rolled back and she stopped breathing. Brief CPR was done by her sister. First episode occurred in 2013 1 she became unresponsive briefly. Second episode in 2014 occurred in the bathroom when she was washing her face.  EEG normal, prior left frontal stroke in 2003. MRI was normal with no new stroke.  Her blood pressure today was approximately 676 systolic on losartan 25 mg. Her sister thinks that she may have been slightly dehydrated at the instance of her last syncopal episode. This was also in the setting of a slightly painful experience while getting her hair plucked. No sweats. No nausea. No chest pain. No shortness of breath.  Echocardiogram in 2009 showed mild aortic regurgitation. Prior EKG unremarkable.    Past Medical History  Diagnosis Date  . Hyperlipidemia   . Allergy   . Hypothyroidism   . Depression   . Down's syndrome   . Stroke     in 2006  . PVD (peripheral vascular disease)   . Moderate intellectual disability   . Hallucinations   . Sleep apnea   . Hypertension     Past Surgical History  Procedure Laterality Date  . Orif lt foot    . Tonsillectomy    . Colonoscopy  06/12/2012     Current Outpatient Prescriptions  Medication Sig Dispense Refill  . aspirin 81 MG tablet Take 81 mg by mouth daily.    . cholecalciferol (VITAMIN D) 1000 UNITS tablet Take 1,000  Units by mouth daily.    Marland Kitchen docusate sodium (COLACE) 100 MG capsule Take 100 mg by mouth 2 (two) times daily.    . hydrocortisone cream 0.5 % Apply 1 application topically 2 (two) times daily.    Marland Kitchen levothyroxine (SYNTHROID, LEVOTHROID) 75 MCG tablet Take 75 mcg by mouth daily before breakfast.    . loratadine (CLARITIN) 10 MG tablet Take 10 mg by mouth daily.    Marland Kitchen losartan (COZAAR) 25 MG tablet Take 12.5 mg by mouth daily.     Vladimir Faster Glycol-Propyl Glycol 0.4-0.3 % SOLN Apply to eye daily.    . polyethylene glycol (MIRALAX / GLYCOLAX) packet Take 17 g by mouth daily.    Marland Kitchen Propylene Glycol (SYSTANE BALANCE OP) Apply 1 drop to eye daily. Place 1 drop in each eye, every day    . simvastatin (ZOCOR) 40 MG tablet Take 40 mg by mouth at bedtime. At bedtime    . sodium chloride (OCEAN) 0.65 % SOLN nasal spray Place 1 spray into both nostrils as needed for congestion.     No current facility-administered medications for this visit.    Allergies:   Review of patient's allergies indicates no known allergies.    Social History:  The patient  reports that she has never smoked. She has never used smokeless tobacco. She reports that she does not  drink alcohol or use illicit drugs.   Family History:  The patient's family history includes Colon cancer in her maternal grandfather and paternal uncle.    ROS:  Please see the history of present illness.   Otherwise, review of systems are positive for none.   All other systems are reviewed and negative.    PHYSICAL EXAM: VS:  BP 102/72 mmHg  Pulse 54  Ht 4\' 11"  (1.499 m)  Wt 134 lb (60.782 kg)  BMI 27.05 kg/m2 , BMI Body mass index is 27.05 kg/(m^2). GEN: Well nourished, well developed, in no acute distress, Down syndrome HEENT: normal Neck: no JVD, carotid bruits, or masses, normal carotid upstroke Cardiac: RRR; soft systolic murmur,no rubs, or gallops,no edema  Respiratory:  clear to auscultation bilaterally, normal work of breathing GI: soft,  nontender, nondistended, + BS MS: no deformity or atrophy Skin: warm and dry, mild posterior rash above feet Neuro:  Strength and sensation are intact Psych: euthymic mood, full affect   EKG:  10/09/14-sinus bradycardia 50 with nonspecific ST-T wave changes.   Recent Labs: 02/22/2014: ALT 18 10/09/2014: BUN 17; Creatinine 1.11*; Hemoglobin 13.7; Platelets 226; Potassium 4.0; Sodium 139    Lipid Panel No results found for: CHOL, TRIG, HDL, CHOLHDL, VLDL, LDLCALC, LDLDIRECT    Wt Readings from Last 3 Encounters:  11/14/14 134 lb (60.782 kg)  11/09/14 133 lb (60.328 kg)  06/09/14 149 lb (67.586 kg)      Other studies Reviewed: Additional studies/ records that were reviewed today include: Prior hospital records, echocardiogram, EKG, lab work. Review of the above records demonstrates: as above   ASSESSMENT AND PLAN:  1.  Vasovagal syncope-full neurologic workup was unremarkable. MRI unremarkable. It is likely that her single episode occurred in the setting of low blood pressure, mild dehydration, mild pain stimuli and has classic prodrome of ashen appearance, pallor which then resulted in syncope. It is not uncommon during brief episodes of syncope to have brief involuntary muscular movements or snorts as her sister described. In order to help alleviate future episodes, I will discontinue her losartan 25 mg. Her blood pressure is fairly low today at 654 systolic. Continue to maintain hydration. I will also check an echocardiogram to make sure that structure and function of her heart is reassuring. She had mild regurgitation of redo aortic valve previously. Check for bicuspid aortic valve. I do not believe that she has a murmur of aortic stenosis.  2. Sleep apnea-continue to encourage CPAP. She may be a tolerate this.  3. Hypothyroidism-Synthroid  4. Prior stroke 2003-continue simvastatin, aspirin  5. Essential hypertension-currently her blood pressure was low. Stopping  losartan.   Current medicines are reviewed at length with the patient today.  The patient does not have concerns regarding medicines.  The following changes have been made:  Stopping losartan. Make Colace and MiraLAX when necessary.  Labs/ tests ordered today include: echo  No orders of the defined types were placed in this encounter.     Disposition:   FU with [facility] in 3 months  Signed, Candee Furbish, MD  11/14/2014 3:58 PM    [facility] [place], [place], [place]  [address] Phone: [phone]; Fax: [phone]

## 2014-11-19 ENCOUNTER — Encounter (HOSPITAL_COMMUNITY): Payer: Self-pay | Admitting: Emergency Medicine

## 2014-11-19 ENCOUNTER — Emergency Department (HOSPITAL_COMMUNITY)
Admission: EM | Admit: 2014-11-19 | Discharge: 2014-11-19 | Disposition: A | Discharge status: Home / Self Care | Payer: Medicare Other | Attending: Emergency Medicine | Admitting: Emergency Medicine

## 2014-11-19 DIAGNOSIS — Z79899 Other long term (current) drug therapy: Secondary | ICD-10-CM | POA: Insufficient documentation

## 2014-11-19 DIAGNOSIS — Z8669 Personal history of other diseases of the nervous system and sense organs: Secondary | ICD-10-CM | POA: Diagnosis not present

## 2014-11-19 DIAGNOSIS — Z7982 Long term (current) use of aspirin: Secondary | ICD-10-CM | POA: Insufficient documentation

## 2014-11-19 DIAGNOSIS — N390 Urinary tract infection, site not specified: Secondary | ICD-10-CM

## 2014-11-19 DIAGNOSIS — Z8659 Personal history of other mental and behavioral disorders: Secondary | ICD-10-CM | POA: Diagnosis not present

## 2014-11-19 DIAGNOSIS — R55 Syncope and collapse: Secondary | ICD-10-CM | POA: Insufficient documentation

## 2014-11-19 DIAGNOSIS — Q909 Down syndrome, unspecified: Secondary | ICD-10-CM | POA: Diagnosis not present

## 2014-11-19 DIAGNOSIS — Z7952 Long term (current) use of systemic steroids: Secondary | ICD-10-CM | POA: Diagnosis not present

## 2014-11-19 DIAGNOSIS — E785 Hyperlipidemia, unspecified: Secondary | ICD-10-CM | POA: Diagnosis not present

## 2014-11-19 DIAGNOSIS — I10 Essential (primary) hypertension: Secondary | ICD-10-CM | POA: Diagnosis not present

## 2014-11-19 DIAGNOSIS — E039 Hypothyroidism, unspecified: Secondary | ICD-10-CM | POA: Diagnosis not present

## 2014-11-19 DIAGNOSIS — Z8673 Personal history of transient ischemic attack (TIA), and cerebral infarction without residual deficits: Secondary | ICD-10-CM | POA: Insufficient documentation

## 2014-11-19 DIAGNOSIS — R404 Transient alteration of awareness: Secondary | ICD-10-CM | POA: Diagnosis not present

## 2014-11-19 LAB — URINALYSIS, ROUTINE W REFLEX MICROSCOPIC
Bilirubin Urine: NEGATIVE
Glucose, UA: NEGATIVE mg/dL
Ketones, ur: NEGATIVE mg/dL
Nitrite: NEGATIVE
Protein, ur: NEGATIVE mg/dL
Specific Gravity, Urine: 1.015 (ref 1.005–1.030)
Urobilinogen, UA: 0.2 mg/dL (ref 0.0–1.0)
pH: 5.5 (ref 5.0–8.0)

## 2014-11-19 LAB — CBC WITH DIFFERENTIAL/PLATELET
Basophils Absolute: 0 10*3/uL (ref 0.0–0.1)
Basophils Relative: 1 % (ref 0–1)
Eosinophils Absolute: 0.1 10*3/uL (ref 0.0–0.7)
Eosinophils Relative: 1 % (ref 0–5)
HCT: 39 % (ref 36.0–46.0)
Hemoglobin: 13 g/dL (ref 12.0–15.0)
Lymphocytes Relative: 19 % (ref 12–46)
Lymphs Abs: 1.4 10*3/uL (ref 0.7–4.0)
MCH: 31 pg (ref 26.0–34.0)
MCHC: 33.3 g/dL (ref 30.0–36.0)
MCV: 92.9 fL (ref 78.0–100.0)
Monocytes Absolute: 0.4 10*3/uL (ref 0.1–1.0)
Monocytes Relative: 6 % (ref 3–12)
Neutro Abs: 5.2 10*3/uL (ref 1.7–7.7)
Neutrophils Relative %: 73 % (ref 43–77)
Platelets: 191 10*3/uL (ref 150–400)
RBC: 4.2 MIL/uL (ref 3.87–5.11)
RDW: 14.3 % (ref 11.5–15.5)
WBC: 7.1 10*3/uL (ref 4.0–10.5)

## 2014-11-19 LAB — BASIC METABOLIC PANEL
Anion gap: 9 (ref 5–15)
BUN: 15 mg/dL (ref 6–20)
CO2: 25 mmol/L (ref 22–32)
Calcium: 8.3 mg/dL — ABNORMAL LOW (ref 8.9–10.3)
Chloride: 104 mmol/L (ref 101–111)
Creatinine, Ser: 1.03 mg/dL — ABNORMAL HIGH (ref 0.44–1.00)
GFR calc Af Amer: >60 mL/min (ref >60)
GFR calc non Af Amer: 59 mL/min — ABNORMAL LOW (ref >60)
Glucose, Bld: 73 mg/dL (ref 65–99)
Potassium: 3.7 mmol/L (ref 3.5–5.1)
Sodium: 138 mmol/L (ref 135–145)

## 2014-11-19 LAB — URINE MICROSCOPIC-ADD ON

## 2014-11-19 LAB — TROPONIN I: Troponin I: <0.03 ng/mL (ref <0.031)

## 2014-11-19 MED ORDER — CEPHALEXIN 250 MG PO CAPS
500.0000 mg | ORAL_CAPSULE | Freq: Once | ORAL | Status: AC
  Administered 2014-11-19: 500 mg via ORAL
  Filled 2014-11-19: qty 2

## 2014-11-19 MED ORDER — SODIUM CHLORIDE 0.9 % IV BOLUS (SEPSIS)
1000.0000 mL | Freq: Once | INTRAVENOUS | Status: AC
  Administered 2014-11-19: 1000 mL via INTRAVENOUS

## 2014-11-19 MED ORDER — CEPHALEXIN 500 MG PO CAPS: 500.0000 mg | ORAL_CAPSULE | Freq: Three times a day (TID) | ORAL | Status: DC

## 2014-11-19 NOTE — Discharge Instructions (Signed)
Urinary Tract Infection A urinary tract infection (UTI) can occur any place along the urinary tract. The tract includes the kidneys, ureters, bladder, and urethra. A type of germ called bacteria often causes a UTI. UTIs are often helped with antibiotic medicine.  HOME CARE   If given, take antibiotics as told by your doctor. Finish them even if you start to feel better.  Drink enough fluids to keep your pee (urine) clear or pale yellow.  Avoid tea, drinks with caffeine, and bubbly (carbonated) drinks.  Pee often. Avoid holding your pee in for a long time.  Pee before and after having sex (intercourse).  Wipe from front to back after you poop (bowel movement) if you are a woman. Use each tissue only once. GET HELP RIGHT AWAY IF:   You have back pain.  You have lower belly (abdominal) pain.  You have chills.  You feel sick to your stomach (nauseous).  You throw up (vomit).  Your burning or discomfort with peeing does not go away.  You have a fever.  Your symptoms are not better in 3 days. MAKE SURE YOU:   Understand these instructions.  Will watch your condition.  Will get help right away if you are not doing well or get worse. Document Released: 11/13/2007 Document Revised: 02/19/2012 Document Reviewed: 12/26/2011 [facility] Patient Information 2015 [place], [place]. This information is not intended to replace advice given to you by your health care provider. Make sure you discuss any questions you have with your health care provider.  Syncope Syncope is a medical term for fainting or passing out. This means you lose consciousness and drop to the ground. People are generally unconscious for less than 5 minutes. You may have some muscle twitches for up to 15 seconds before waking up and returning to normal. Syncope occurs more often in older adults, but it can happen to anyone. While most causes of syncope are not dangerous, syncope can be a sign of a serious medical problem. It  is important to seek medical care.  CAUSES  Syncope is caused by a sudden drop in blood flow to the brain. The specific cause is often not determined. Factors that can bring on syncope include:  Taking medicines that lower blood pressure.  Sudden changes in posture, such as standing up quickly.  Taking more medicine than prescribed.  Standing in one place for too long.  Seizure disorders.  Dehydration and excessive exposure to heat.  Low blood sugar (hypoglycemia).  Straining to have a bowel movement.  Heart disease, irregular heartbeat, or other circulatory problems.  Fear, emotional distress, seeing blood, or severe pain. SYMPTOMS  Right before fainting, you may:  Feel dizzy or light-headed.  Feel nauseous.  See all white or all black in your field of vision.  Have cold, clammy skin. DIAGNOSIS  Your health care provider will ask about your symptoms, perform a physical exam, and perform an electrocardiogram (ECG) to record the electrical activity of your heart. Your health care provider may also perform other heart or blood tests to determine the cause of your syncope which may include:  Transthoracic echocardiogram (TTE). During echocardiography, sound waves are used to evaluate how blood flows through your heart.  Transesophageal echocardiogram (TEE).  Cardiac monitoring. This allows your health care provider to monitor your heart rate and rhythm in real time.  Holter monitor. This is a portable device that records your heartbeat and can help diagnose heart arrhythmias. It allows your health care provider to track your  heart activity for several days, if needed.  Stress tests by exercise or by giving medicine that makes the heart beat faster. TREATMENT  In most cases, no treatment is needed. Depending on the cause of your syncope, your health care provider may recommend changing or stopping some of your medicines. HOME CARE INSTRUCTIONS  Have someone stay with you  until you feel stable.  Do not drive, use machinery, or play sports until your health care provider says it is okay.  Keep all follow-up appointments as directed by your health care provider.  Lie down right away if you start feeling like you might faint. Breathe deeply and steadily. Wait until all the symptoms have passed.  Drink enough fluids to keep your urine clear or pale yellow.  If you are taking blood pressure or heart medicine, get up slowly and take several minutes to sit and then stand. This can reduce dizziness. SEEK IMMEDIATE MEDICAL CARE IF:   You have a severe headache.  You have unusual pain in the chest, abdomen, or back.  You are bleeding from your mouth or rectum, or you have black or tarry stool.  You have an irregular or very fast heartbeat.  You have pain with breathing.  You have repeated fainting or seizure-like jerking during an episode.  You faint when sitting or lying down.  You have confusion.  You have trouble walking.  You have severe weakness.  You have vision problems. If you fainted, call your local emergency services (911 in [place] Do not drive yourself to the hospital.  MAKE SURE YOU:  Understand these instructions.  Will watch your condition.  Will get help right away if you are not doing well or get worse. Document Released: 05/27/2005 Document Revised: 06/01/2013 Document Reviewed: 07/26/2011 [facility] Patient Information 2015 [place], [place]. This information is not intended to replace advice given to you by your health care provider. Make sure you discuss any questions you have with your health care provider.

## 2014-11-19 NOTE — ED Notes (Signed)
Pt ambulated independently with RN, states she feels much better. Escorted to bathroom by RN as well, urinary frequency with little production of urine.

## 2014-11-19 NOTE — ED Notes (Signed)
MD at bedside. 

## 2014-11-19 NOTE — ED Notes (Signed)
Pt arrived by [facility] with c/o syncopal episode. Pt has hx of syncopal episode and heart murmur. Pt was outside and staying hydrated stated to caretaker that she became dizzy and then had a syncopal episode. Family/bystanders lowered pt to the ground. While Pt was with EMS pt had a syncopal episode when standing. EMS administered 62ml bolus of fluid. Pt denies pain and just c/o being dizzy.

## 2014-11-20 LAB — URINE CULTURE: Colony Count: >=100000

## 2014-11-22 ENCOUNTER — Ambulatory Visit: Payer: Federal, State, Local not specified - PPO | Admitting: Neurology

## 2014-11-22 ENCOUNTER — Ambulatory Visit (HOSPITAL_COMMUNITY): Payer: Medicare Other | Attending: Cardiology

## 2014-11-22 ENCOUNTER — Other Ambulatory Visit: Payer: Self-pay

## 2014-11-22 DIAGNOSIS — I071 Rheumatic tricuspid insufficiency: Secondary | ICD-10-CM | POA: Insufficient documentation

## 2014-11-22 DIAGNOSIS — I340 Nonrheumatic mitral (valve) insufficiency: Secondary | ICD-10-CM | POA: Diagnosis not present

## 2014-11-22 DIAGNOSIS — I313 Pericardial effusion (noninflammatory): Secondary | ICD-10-CM | POA: Diagnosis not present

## 2014-11-22 DIAGNOSIS — R55 Syncope and collapse: Secondary | ICD-10-CM

## 2014-11-22 DIAGNOSIS — I351 Nonrheumatic aortic (valve) insufficiency: Secondary | ICD-10-CM | POA: Insufficient documentation

## 2014-11-25 NOTE — ED Provider Notes (Signed)
CSN: [id number]     Arrival date & time 11/19/14  1759 History   First MD Initiated Contact with Patient 11/19/14 1803     Chief Complaint  Patient presents with  . Loss of Consciousness     (Consider location/radiation/quality/duration/timing/severity/associated sxs/prior Treatment) HPI   59yF with syncope. History from caretakers/sister and she has a history of MR. Outside standing when her "eyes rolled back and seemed out of it." Lowered to ground. Brief LOC. Quickly regained consciousness and said "I"m back." Stood up and subsequently had another similar event. Currently seems back to her baseline. No specific complaints recently. Hx of syncope with prior w/u.   Past Medical History  Diagnosis Date  . Hyperlipidemia   . Allergy   . Hypothyroidism   . Depression   . Down's syndrome   . Stroke     in 2006  . PVD (peripheral vascular disease)   . Moderate intellectual disability   . Hallucinations   . Sleep apnea   . Hypertension    Past Surgical History  Procedure Laterality Date  . Orif lt foot    . Tonsillectomy    . Colonoscopy  06/12/2012   Family History  Problem Relation Age of Onset  . Colon cancer Paternal Uncle   . Colon cancer Maternal Grandfather    History  Substance Use Topics  . Smoking status: Never Smoker   . Smokeless tobacco: Never Used  . Alcohol Use: No   OB History    No data available     Review of Systems All systems reviewed and negative, other than as noted in HPI.    Allergies  Review of patient's allergies indicates no known allergies.  Home Medications   Prior to Admission medications   Medication Sig Start Date End Date Taking? Authorizing Provider  aspirin 81 MG tablet Take 81 mg by mouth daily.    Historical Provider, MD  cephALEXin (KEFLEX) 500 MG capsule Take 1 capsule (500 mg total) by mouth 3 (three) times daily. 11/19/14   Virgel Manifold, MD  cholecalciferol (VITAMIN D) 1000 UNITS tablet Take 1,000 Units by mouth daily.     Historical Provider, MD  docusate sodium (COLACE) 100 MG capsule Take 1 capsule (100 mg total) by mouth daily as needed for mild constipation. 11/14/14   Jerline Pain, MD  hydrocortisone cream 0.5 % Apply 1 application topically 2 (two) times daily.    Historical Provider, MD  levothyroxine (SYNTHROID, LEVOTHROID) 75 MCG tablet Take 75 mcg by mouth daily before breakfast.    Historical Provider, MD  loratadine (CLARITIN) 10 MG tablet Take 10 mg by mouth daily.    Historical Provider, MD  Polyethyl Glycol-Propyl Glycol 0.4-0.3 % SOLN Apply to eye daily.    Historical Provider, MD  polyethylene glycol (MIRALAX / GLYCOLAX) packet Take 17 g by mouth as needed.    Historical Provider, MD  Propylene Glycol (SYSTANE BALANCE OP) Apply 1 drop to eye daily. Place 1 drop in each eye, every day    Historical Provider, MD  simvastatin (ZOCOR) 40 MG tablet Take 40 mg by mouth at bedtime. At bedtime    Historical Provider, MD  sodium chloride (OCEAN) 0.65 % SOLN nasal spray Place 1 spray into both nostrils as needed for congestion.    Historical Provider, MD   BP 142/48 mmHg  Pulse 60  Temp(Src) 98 F (36.7 C) (Oral)  Resp 13  SpO2 100% Physical Exam  Constitutional: She appears well-developed and well-nourished. No distress.  HENT:  Head: Normocephalic and atraumatic.  Eyes: Conjunctivae are normal. Right eye exhibits no discharge. Left eye exhibits no discharge.  Neck: Neck supple.  Cardiovascular: Normal rate, regular rhythm and normal heart sounds.  Exam reveals no gallop and no friction rub.   No murmur heard. Pulmonary/Chest: Effort normal and breath sounds normal. No respiratory distress.  Abdominal: Soft. She exhibits no distension. There is no tenderness.  Musculoskeletal: She exhibits no edema or tenderness.  Lower extremities symmetric as compared to each other. No calf tenderness. Negative Homan's. No palpable cords.   Neurological: She is alert.  Skin: Skin is warm and dry.   Psychiatric: She has a normal mood and affect. Her behavior is normal. Thought content normal.  Nursing note and vitals reviewed.   ED Course  Procedures (including critical care time) Labs Review Labs Reviewed  BASIC METABOLIC PANEL - Abnormal; Notable for the following:    Creatinine, Ser 1.03 (*)    Calcium 8.3 (*)    GFR calc non Af Amer 59 (*)    All other components within normal limits  URINALYSIS, ROUTINE W REFLEX MICROSCOPIC (NOT AT [facility]) - Abnormal; Notable for the following:    APPearance TURBID (*)    Hgb urine dipstick TRACE (*)    Leukocytes, UA LARGE (*)    All other components within normal limits  URINE MICROSCOPIC-ADD ON - Abnormal; Notable for the following:    Squamous Epithelial / LPF FEW (*)    Bacteria, UA FEW (*)    All other components within normal limits  URINE CULTURE  CBC WITH DIFFERENTIAL/PLATELET  TROPONIN I    Imaging Review No results found.   EKG Interpretation   Date/Time:  Saturday November 19 2014 17:59:59 EDT Ventricular Rate:  48 PR Interval:  135 QRS Duration: 79 QT Interval:  563 QTC Calculation: 503 R Axis:   91 Text Interpretation:  Sinus bradycardia Borderline right axis deviation  Borderline T wave abnormalities No significant change since last tracing  Confirmed by [place]  MD, [place] ([id number]) on 11/19/2014 6:06:21 PM      MDM   Final diagnoses:  Syncope and collapse  UTI (lower urinary tract infection)   59yF with syncope. Hx of same. No clear etiology. Dehydration? Appears to be ather baseline. Questionable UTI. With unable to obtain clear ROS, will tx. With reoccurence, may need event monitor.      Virgel Manifold, MD 11/25/14 [phone]

## 2014-11-28 DIAGNOSIS — Z87440 Personal history of urinary (tract) infections: Secondary | ICD-10-CM | POA: Diagnosis not present

## 2014-11-29 DIAGNOSIS — F331 Major depressive disorder, recurrent, moderate: Secondary | ICD-10-CM | POA: Diagnosis not present

## 2014-11-29 DIAGNOSIS — Q909 Down syndrome, unspecified: Secondary | ICD-10-CM | POA: Diagnosis not present

## 2014-11-29 DIAGNOSIS — E119 Type 2 diabetes mellitus without complications: Secondary | ICD-10-CM | POA: Diagnosis not present

## 2014-11-29 DIAGNOSIS — N183 Chronic kidney disease, stage 3 (moderate): Secondary | ICD-10-CM | POA: Diagnosis not present

## 2014-11-29 DIAGNOSIS — G4733 Obstructive sleep apnea (adult) (pediatric): Secondary | ICD-10-CM | POA: Diagnosis not present

## 2014-11-29 DIAGNOSIS — F328 Other depressive episodes: Secondary | ICD-10-CM | POA: Diagnosis not present

## 2014-11-29 DIAGNOSIS — I69354 Hemiplegia and hemiparesis following cerebral infarction affecting left non-dominant side: Secondary | ICD-10-CM | POA: Diagnosis not present

## 2014-11-29 DIAGNOSIS — I10 Essential (primary) hypertension: Secondary | ICD-10-CM | POA: Diagnosis not present

## 2014-11-29 DIAGNOSIS — I6930 Unspecified sequelae of cerebral infarction: Secondary | ICD-10-CM | POA: Diagnosis not present

## 2014-11-29 DIAGNOSIS — E559 Vitamin D deficiency, unspecified: Secondary | ICD-10-CM | POA: Diagnosis not present

## 2014-11-29 DIAGNOSIS — E039 Hypothyroidism, unspecified: Secondary | ICD-10-CM | POA: Diagnosis not present

## 2014-11-29 DIAGNOSIS — E780 Pure hypercholesterolemia: Secondary | ICD-10-CM | POA: Diagnosis not present

## 2014-12-14 ENCOUNTER — Ambulatory Visit (INDEPENDENT_AMBULATORY_CARE_PROVIDER_SITE_OTHER): Payer: Medicare Other | Admitting: Podiatry

## 2014-12-14 VITALS — BP 137/65 | HR 54 | Resp 14

## 2014-12-14 DIAGNOSIS — M79676 Pain in unspecified toe(s): Secondary | ICD-10-CM | POA: Diagnosis not present

## 2014-12-14 DIAGNOSIS — B351 Tinea unguium: Secondary | ICD-10-CM

## 2014-12-14 DIAGNOSIS — B353 Tinea pedis: Secondary | ICD-10-CM

## 2014-12-14 DIAGNOSIS — M79674 Pain in right toe(s): Secondary | ICD-10-CM

## 2014-12-14 DIAGNOSIS — M79675 Pain in left toe(s): Secondary | ICD-10-CM

## 2014-12-14 MED ORDER — CLOTRIMAZOLE-BETAMETHASONE 1-0.05 % EX CREA: 1.0000 "application " | TOPICAL_CREAM | Freq: Two times a day (BID) | CUTANEOUS | Status: DC

## 2014-12-14 NOTE — Progress Notes (Signed)
Patient ID: Amanda Castillo, female   DOB: [birth year], 57 y.o.   MRN: [id number] Trim toe nails   Complaint:  Visit Type: Patient returns to my office for continued preventative foot care services. Complaint: Patient states" my nails have grown long and thick and become painful to walk and wear shoes" Patient has been diagnosed with DM with no complications. He presents for preventative foot care services. No changes to ROS  Podiatric Exam: Vascular: dorsalis pedis and posterior tibial pulses are palpable bilateral. Capillary return is immediate. Temperature gradient is WNL. Skin turgor WNL  Sensorium: Normal Semmes Weinstein monofilament test. Normal tactile sensation bilaterally. Nail Exam: Pt has thick disfigured discolored nails with subungual debris noted bilateral entire nail hallux through fifth toenails Ulcer Exam: There is no evidence of ulcer or pre-ulcerative changes or infection. Orthopedic Exam: Muscle tone and strength are WNL. No limitations in general ROM. No crepitus or effusions noted. Foot type and digits show no abnormalities. Bony prominences are unremarkable. Severe HAV B/L.   Skin: No Porokeratosis. No infection or ulcers.  Chronic tinea pedia both feet.  Diagnosis:  Tinea unguium, Pain in right toe, pain in left toes  Treatment & Plan Procedures and Treatment: Consent by patient was obtained for treatment procedures. The patient understood the discussion of treatment and procedures well. All questions were answered thoroughly reviewed. Debridement of mycotic and hypertrophic toenails, 1 through 5 bilateral and clearing of subungual debris. No ulceration, no infection noted.  Return Visit-Office Procedure: Patient instructed to return to the office for a follow up visit 3 months for continued evaluation and treatment. Prescribed lotrisone.

## 2014-12-21 ENCOUNTER — Telehealth: Payer: Self-pay | Admitting: *Deleted

## 2014-12-21 NOTE — Telephone Encounter (Signed)
Dr. Prudence Davidson states if the insurance will not cover Clotrimazole/Betamet switch pt to Lotrimen OTC or Lamisil cream.  I informed pt's caregiver, Yolande Jolly and mailed a prescription for orders to pt's home address.

## 2015-01-11 DIAGNOSIS — R269 Unspecified abnormalities of gait and mobility: Secondary | ICD-10-CM | POA: Diagnosis not present

## 2015-01-16 ENCOUNTER — Emergency Department (HOSPITAL_COMMUNITY): Payer: Medicare Other

## 2015-01-16 ENCOUNTER — Encounter (HOSPITAL_COMMUNITY): Payer: Self-pay | Admitting: Emergency Medicine

## 2015-01-16 ENCOUNTER — Emergency Department (HOSPITAL_COMMUNITY)
Admission: EM | Admit: 2015-01-16 | Discharge: 2015-01-16 | Disposition: A | Discharge status: Home / Self Care | Payer: Medicare Other | Attending: Emergency Medicine | Admitting: Emergency Medicine

## 2015-01-16 DIAGNOSIS — Z8673 Personal history of transient ischemic attack (TIA), and cerebral infarction without residual deficits: Secondary | ICD-10-CM | POA: Diagnosis not present

## 2015-01-16 DIAGNOSIS — Z7982 Long term (current) use of aspirin: Secondary | ICD-10-CM | POA: Diagnosis not present

## 2015-01-16 DIAGNOSIS — F329 Major depressive disorder, single episode, unspecified: Secondary | ICD-10-CM | POA: Insufficient documentation

## 2015-01-16 DIAGNOSIS — I10 Essential (primary) hypertension: Secondary | ICD-10-CM | POA: Diagnosis not present

## 2015-01-16 DIAGNOSIS — Z8669 Personal history of other diseases of the nervous system and sense organs: Secondary | ICD-10-CM | POA: Diagnosis not present

## 2015-01-16 DIAGNOSIS — E039 Hypothyroidism, unspecified: Secondary | ICD-10-CM | POA: Insufficient documentation

## 2015-01-16 DIAGNOSIS — Q909 Down syndrome, unspecified: Secondary | ICD-10-CM

## 2015-01-16 DIAGNOSIS — R55 Syncope and collapse: Secondary | ICD-10-CM | POA: Diagnosis not present

## 2015-01-16 DIAGNOSIS — R4020 Unspecified coma: Secondary | ICD-10-CM | POA: Diagnosis not present

## 2015-01-16 DIAGNOSIS — E784 Other hyperlipidemia: Secondary | ICD-10-CM | POA: Diagnosis not present

## 2015-01-16 DIAGNOSIS — Z79899 Other long term (current) drug therapy: Secondary | ICD-10-CM | POA: Insufficient documentation

## 2015-01-16 LAB — BASIC METABOLIC PANEL
Anion gap: 12 (ref 5–15)
BUN: 11 mg/dL (ref 6–20)
CHLORIDE: 106 mmol/L (ref 101–111)
CO2: 23 mmol/L (ref 22–32)
Calcium: 8.8 mg/dL — ABNORMAL LOW (ref 8.9–10.3)
Creatinine, Ser: 1.03 mg/dL — ABNORMAL HIGH (ref 0.44–1.00)
GFR calc non Af Amer: 59 mL/min — ABNORMAL LOW (ref >60)
GLUCOSE: 103 mg/dL — AB (ref 65–99)
Potassium: 4 mmol/L (ref 3.5–5.1)
SODIUM: 141 mmol/L (ref 135–145)

## 2015-01-16 LAB — URINALYSIS, ROUTINE W REFLEX MICROSCOPIC
Glucose, UA: NEGATIVE mg/dL
Hgb urine dipstick: NEGATIVE
KETONES UR: 15 mg/dL — AB
Nitrite: NEGATIVE
PH: 6 (ref 5.0–8.0)
Protein, ur: NEGATIVE mg/dL
Specific Gravity, Urine: 1.027 (ref 1.005–1.030)
Urobilinogen, UA: 1 mg/dL (ref 0.0–1.0)

## 2015-01-16 LAB — URINE MICROSCOPIC-ADD ON

## 2015-01-16 LAB — I-STAT TROPONIN, ED: Troponin i, poc: 0 ng/mL (ref 0.00–0.08)

## 2015-01-16 LAB — CBC WITH DIFFERENTIAL/PLATELET
Basophils Absolute: 0.1 10*3/uL (ref 0.0–0.1)
Basophils Relative: 1 % (ref 0–1)
EOS PCT: 1 % (ref 0–5)
Eosinophils Absolute: 0 10*3/uL (ref 0.0–0.7)
HCT: 45.7 % (ref 36.0–46.0)
Hemoglobin: 15.1 g/dL — ABNORMAL HIGH (ref 12.0–15.0)
LYMPHS PCT: 14 % (ref 12–46)
Lymphs Abs: 1.1 10*3/uL (ref 0.7–4.0)
MCH: 31.5 pg (ref 26.0–34.0)
MCHC: 33 g/dL (ref 30.0–36.0)
MCV: 95.2 fL (ref 78.0–100.0)
Monocytes Absolute: 0.4 10*3/uL (ref 0.1–1.0)
Monocytes Relative: 6 % (ref 3–12)
Neutro Abs: 6.3 10*3/uL (ref 1.7–7.7)
Neutrophils Relative %: 80 % — ABNORMAL HIGH (ref 43–77)
Platelets: 188 10*3/uL (ref 150–400)
RBC: 4.8 MIL/uL (ref 3.87–5.11)
RDW: 14.1 % (ref 11.5–15.5)
WBC: 7.9 10*3/uL (ref 4.0–10.5)

## 2015-01-16 MED ORDER — SODIUM CHLORIDE 0.9 % IV BOLUS (SEPSIS)
1000.0000 mL | Freq: Once | INTRAVENOUS | Status: AC
  Administered 2015-01-16: 1000 mL via INTRAVENOUS

## 2015-01-16 NOTE — Progress Notes (Signed)
NCM consulted to aid with DME wheelchair.  Pt received DME rolling walker in 2014 and is not eligible for DM ambulating equipment until 2019.  Caregiver [name] inquired about cost of DME wheelchair, NCM relayed it would be $125/month for rental.  NCM suggested she check with [employer] and or Solic[employer] for used DME.

## 2015-01-16 NOTE — ED Notes (Signed)
Received pt from home with c/o witnessed syncopal episode while ambulating at home. Bystander reports that pt had + LOC for 30 seconds. Pt was orthostatic for EMS Supine BP 100/40, sitting BP 80 manual. CBG 114 for EMS

## 2015-01-16 NOTE — ED Provider Notes (Signed)
I saw and evaluated the patient, reviewed the resident's note and I agree with the findings and plan.  Pt has history of syncopal episode in the past.  Presented to the ED today with recurrent episode.  Pt appears comfortable now but mild bradycardia and hypotension.   Plan on checking labs, monitor.    May be having episodes of symptomatic bradycardia.  Will need further evaluation.  Will decide whether this can safely be done as an outpatient vs inpatient   EKG Interpretation   Date/Time:  Monday January 16 2015 09:15:08 EDT Ventricular Rate:  49 PR Interval:  139 QRS Duration: 81 QT Interval:  526 QTC Calculation: 475 R Axis:   72 Text Interpretation:  Sinus bradycardia Consider left ventricular  hypertrophy No significant change since last tracing Confirmed by Adarian Bur   MD-J, Carrel Leather ([id number]) on 01/16/2015 9:49:34 AM      .  Dorie Rank, MD 01/16/15 1054

## 2015-01-16 NOTE — ED Provider Notes (Signed)
CSN: [id number]     Arrival date & time 01/16/15  0914 History   First MD Initiated Contact with Patient 01/16/15 [phone]     Chief Complaint  Patient presents with  . Loss of Consciousness     (Consider location/radiation/quality/duration/timing/severity/associated sxs/prior Treatment) Patient is a 59 y.o. female presenting with syncope. The history is provided by the patient and the EMS personnel. The history is limited by a developmental delay.  Loss of Consciousness Episode history:  Single Most recent episode:  Today Duration:  30 seconds (per bystander) Timing:  Sporadic Progression:  Resolved Chronicity:  Recurrent (pt last seen here in 11/2014 for similar, no clear etiology, possible UTI which was treated. Notable bradycardia then too) Witnessed: yes   Relieved by:  Nothing Worsened by:  Nothing tried Ineffective treatments:  None tried Associated symptoms: no chest pain, no fever and no shortness of breath     Further discussion with caregiver [name]: patient was reportedly walking to her room to close the door when caregiver heard the patient fall. She arrived and found patient lying in the hallway unconscious. She relates this lasted approximately 30 seconds when the patient woke up. Patient was still somewhat confused afterwards and so they'll laid there until the paramedics arrived. Caregiver states last episode there were multiple episodes of syncope within the time of EMS arriving and getting on the backboard. She relates this time there was one episode. She denies any known recent fevers chills or the patient complaining of chest pain or shortness of breath. Patient has not been complaining of any dysuria recently.  Past Medical History  Diagnosis Date  . Hyperlipidemia   . Allergy   . Hypothyroidism   . Depression   . Down's syndrome   . Stroke     in 2006  . PVD (peripheral vascular disease)   . Moderate intellectual disability   . Hallucinations   . Sleep apnea    . Hypertension    Past Surgical History  Procedure Laterality Date  . Orif lt foot    . Tonsillectomy    . Colonoscopy  06/12/2012   Family History  Problem Relation Age of Onset  . Colon cancer Paternal Uncle   . Colon cancer Maternal Grandfather    History  Substance Use Topics  . Smoking status: Never Smoker   . Smokeless tobacco: Never Used  . Alcohol Use: No   OB History    No data available     Review of Systems  Unable to perform ROS: Other  Constitutional: Negative for fever and chills.  Respiratory: Negative for cough and shortness of breath.   Cardiovascular: Positive for syncope. Negative for chest pain.  Genitourinary: Negative for dysuria.  Neurological: Positive for syncope (as described in history of present illness).      Allergies  Review of patient's allergies indicates no known allergies.  Home Medications   Prior to Admission medications   Medication Sig Start Date End Date Taking? Authorizing Provider  aspirin 81 MG tablet Take 81 mg by mouth daily.   Yes Historical Provider, MD  cholecalciferol (VITAMIN D) 1000 UNITS tablet Take 1,000 Units by mouth daily.   Yes Historical Provider, MD  docusate sodium (COLACE) 100 MG capsule Take 1 capsule (100 mg total) by mouth daily as needed for mild constipation. 11/14/14  Yes Jerline Pain, MD  levothyroxine (SYNTHROID, LEVOTHROID) 75 MCG tablet Take 75 mcg by mouth daily before breakfast.   Yes Historical Provider, MD  loratadine (CLARITIN)  10 MG tablet Take 10 mg by mouth daily.   Yes Historical Provider, MD  Polyethyl Glycol-Propyl Glycol 0.4-0.3 % SOLN Apply to eye daily.   Yes Historical Provider, MD  Propylene Glycol (SYSTANE BALANCE OP) Apply 1 drop to eye daily. Place 1 drop in each eye, every day   Yes Historical Provider, MD  simvastatin (ZOCOR) 40 MG tablet Take 40 mg by mouth at bedtime. At bedtime   Yes Historical Provider, MD  sodium chloride (OCEAN) 0.65 % SOLN nasal spray Place 1 spray into  both nostrils as needed for congestion.   Yes Historical Provider, MD  terbinafine (LAMISIL) 1 % cream Apply 1 application topically 2 (two) times daily.   Yes Historical Provider, MD  clotrimazole-betamethasone (LOTRISONE) cream Apply 1 application topically 2 (two) times daily. Patient not taking: Reported on 01/16/2015 12/14/14   Gardiner Barefoot, DPM  polyethylene glycol (MIRALAX / GLYCOLAX) packet Take 17 g by mouth as needed for mild constipation.     Historical Provider, MD   BP 105/60 mmHg  Pulse 56  Temp(Src) 98 F (36.7 C) (Oral)  Resp 13  Wt 134 lb (60.782 kg)  SpO2 100% Physical Exam  Constitutional: No distress.  Frail appearing  HENT:  Head: Normocephalic and atraumatic.  Eyes: Conjunctivae are normal.  Left strabismus  Neck: Normal range of motion. Neck supple.  Cardiovascular: Normal rate.   Pulmonary/Chest: Effort normal and breath sounds normal. No respiratory distress.  Abdominal: Soft.  Musculoskeletal: Normal range of motion. She exhibits no edema or tenderness.  Neurological: She is alert. GCS eye subscore is 4. GCS verbal subscore is 5. GCS motor subscore is 6.  5 out of 5 strength throughout  Skin: Skin is warm and dry. She is not diaphoretic.    ED Course  Procedures (including critical care time) Labs Review Labs Reviewed  CBC WITH DIFFERENTIAL/PLATELET - Abnormal; Notable for the following:    Hemoglobin 15.1 (*)    Neutrophils Relative % 80 (*)    All other components within normal limits  BASIC METABOLIC PANEL - Abnormal; Notable for the following:    Glucose, Bld 103 (*)    Creatinine, Ser 1.03 (*)    Calcium 8.8 (*)    GFR calc non Af Amer 59 (*)    All other components within normal limits  URINALYSIS, ROUTINE W REFLEX MICROSCOPIC (NOT AT [facility]) - Abnormal; Notable for the following:    Color, Urine AMBER (*)    Bilirubin Urine SMALL (*)    Ketones, ur 15 (*)    Leukocytes, UA TRACE (*)    All other components within normal limits  URINE  MICROSCOPIC-ADD ON - Abnormal; Notable for the following:    Squamous Epithelial / LPF MANY (*)    Bacteria, UA MANY (*)    All other components within normal limits  I-STAT TROPOININ, ED    Imaging Review Dg Chest 2 View  01/16/2015   CLINICAL DATA:  Syncope.  Loss of consciousness for 30 seconds.  EXAM: CHEST  2 VIEW  COMPARISON:  None.  FINDINGS: Heart size and pulmonary vascularity are normal and the lungs are clear. Chronic compound thoracic scoliosis bear no effusions.  IMPRESSION: No active cardiopulmonary disease.   Electronically Signed   By: Lorriane Shire M.D.   On: 01/16/2015 11:00     EKG Interpretation   Date/Time:  Monday January 16 2015 09:15:08 EDT Ventricular Rate:  49 PR Interval:  139 QRS Duration: 81 QT Interval:  526 QTC Calculation:  475 R Axis:   72 Text Interpretation:  Sinus bradycardia Consider left ventricular  hypertrophy No significant change since last tracing Confirmed by KNAPP   MD-J, JON ([id number]) on 01/16/2015 9:49:34 AM      MDM   Final diagnoses:  Syncope and collapse  Down's syndrome    59 year old female past medical history of Down's syndrome presents today with syncopal episode that occurred immediately prior to arrival. On exam patient is awake and alert. She is hemodynamically stable and in no acute distress. No focal neurological deficits on exam of the exam is limited due to patient participation. No obvious signs or symptoms of infection. Did obtain CBC BMP, i-STAT troponin, urinalysis, chest x-ray. On chest x-ray no focal opacities or findings of any consistent with pneumonia. Urinalysis does not appear consistent with a urinary tract infection although previous urinalysis in the past and did appear consistent with this after syncopal episode. Troponin is negative. No findings consistent with anemia on CBC and BMP is within normal limits. Given the reassuring findings and patient's return to baseline discussed with caregiver and patient  discharged home. They're agreeable with this plan. Patient was ambulated prior to discharge and was able to do so at her normal baseline.    Theodosia Quay, MD 01/16/15 1531

## 2015-02-16 ENCOUNTER — Ambulatory Visit: Payer: Federal, State, Local not specified - PPO | Admitting: Cardiology

## 2015-02-17 ENCOUNTER — Ambulatory Visit (INDEPENDENT_AMBULATORY_CARE_PROVIDER_SITE_OTHER): Payer: Medicare Other | Admitting: Cardiology

## 2015-02-17 ENCOUNTER — Encounter: Payer: Self-pay | Admitting: Cardiology

## 2015-02-17 VITALS — BP 116/68 | HR 48 | Ht <= 58 in | Wt 130.8 lb

## 2015-02-17 DIAGNOSIS — I10 Essential (primary) hypertension: Secondary | ICD-10-CM

## 2015-02-17 DIAGNOSIS — Q909 Down syndrome, unspecified: Secondary | ICD-10-CM | POA: Diagnosis not present

## 2015-02-17 DIAGNOSIS — R55 Syncope and collapse: Secondary | ICD-10-CM

## 2015-02-17 NOTE — Patient Instructions (Signed)
Medication Instructions:  The current medical regimen is effective;  continue present plan and medications.  Please increase your sodium intake and try to drink more fluids such as water and Gatorade.  Follow-Up: Follow up in 6 months with Dr. Marlou Porch.  You will receive a letter in the mail 2 months before you are due.  Please call us when you receive this letter to schedule your follow up appointment.  Thank you for choosing [facility]!!

## 2015-02-17 NOTE — Progress Notes (Signed)
Cardiology Office Note   Date:  02/17/2015   ID:  Amanda Castillo, DOB September 12, 1955, MRN [id number]  PCP:  Reymundo Poll, MD  Cardiologist:   Candee Furbish, MD       History of Present Illness: Amanda Castillo is a 59 y.o. female who presents for follow-up of syncope. She was seen in the neurology clinic. She had an episode of unresponsiveness in September 2015. Lives in a group home, Down syndrome. She was with her sister with her neck extended as her sister was plucking her hair. She then started having blank look and passed out. They remember her turning grayish blue. She stiffened up, eyes rolled back and she stopped breathing. Brief CPR was done by her sister. First episode occurred in 2013 she became unresponsive briefly. Second episode in 2014 occurred in the bathroom when she was washing her face.  EEG normal, prior left frontal stroke in 2003. MRI was normal with no new stroke.  Her blood pressure previously was approximately 983 systolic on losartan 25 mg. Her sister thinks that she may have been slightly dehydrated at the instance of her last syncopal episode. This was also in the setting of a slightly painful experience while getting her hair plucked. No sweats. No nausea. No chest pain. No shortness of breath.  Echocardiogram in 2009 showed mild aortic regurgitation. Prior EKG unremarkable.  02/17/15 - had one more episode of syncope. Went to bathroom, decided to close bedroom door. This was after breakfast. EMS call, snored, woke up right away. Blood pressure was as low as 98 systolic. [facility] - unremarkable workup. Sinus bradycardia once again noted however no significant pauses.   Past Medical History  Diagnosis Date  . Hyperlipidemia   . Allergy   . Hypothyroidism   . Depression   . Down's syndrome   . Stroke     in 2006  . PVD (peripheral vascular disease)   . Moderate intellectual disability   . Hallucinations   . Sleep apnea   . Hypertension     Past Surgical  History  Procedure Laterality Date  . Orif lt foot    . Tonsillectomy    . Colonoscopy  06/12/2012     Current Outpatient Prescriptions  Medication Sig Dispense Refill  . aspirin 81 MG tablet Take 81 mg by mouth daily.    . cholecalciferol (VITAMIN D) 1000 UNITS tablet Take 1,000 Units by mouth daily.    . clotrimazole-betamethasone (LOTRISONE) cream Apply 1 application topically 2 (two) times daily. 30 g 0  . docusate sodium (COLACE) 100 MG capsule Take 1 capsule (100 mg total) by mouth daily as needed for mild constipation. 10 capsule   . levothyroxine (SYNTHROID, LEVOTHROID) 75 MCG tablet Take 75 mcg by mouth daily before breakfast.    . loratadine (CLARITIN) 10 MG tablet Take 10 mg by mouth daily.    Vladimir Faster Glycol-Propyl Glycol 0.4-0.3 % SOLN Apply to eye daily.    . polyethylene glycol (MIRALAX / GLYCOLAX) packet Take 17 g by mouth as needed for mild constipation.     Marland Kitchen Propylene Glycol (SYSTANE BALANCE OP) Apply 1 drop to eye daily. Place 1 drop in each eye, every day    . simvastatin (ZOCOR) 40 MG tablet Take 40 mg by mouth at bedtime. At bedtime    . sodium chloride (OCEAN) 0.65 % SOLN nasal spray Place 1 spray into both nostrils as needed for congestion.    . terbinafine (LAMISIL) 1 % cream Apply  1 application topically 2 (two) times daily.     No current facility-administered medications for this visit.    Allergies:   Review of patient's allergies indicates no known allergies.    Social History:  The patient  reports that she has never smoked. She has never used smokeless tobacco. She reports that she does not drink alcohol or use illicit drugs.   Family History:  The patient's family history includes Colon cancer in her maternal grandfather and paternal uncle.    ROS:  Please see the history of present illness.   Otherwise, review of systems are positive for none.   All other systems are reviewed and negative.    PHYSICAL EXAM: VS:  BP 116/68 mmHg  Pulse 48  Ht  4\' 9"  (1.448 m)  Wt 130 lb 12.8 oz (59.33 kg)  BMI 28.30 kg/m2  SpO2 97% , BMI Body mass index is 28.3 kg/(m^2). GEN: Well nourished, well developed, in no acute distress, Down syndrome HEENT: normal Neck: no JVD, carotid bruits, or masses, normal carotid upstroke Cardiac: RRR; soft systolic murmur,no rubs, or gallops,no edema  Respiratory:  clear to auscultation bilaterally, normal work of breathing GI: soft, nontender, nondistended, + BS MS: no deformity or atrophy Skin: warm and dry, mild posterior rash above feet Neuro:  Strength and sensation are intact Psych: euthymic mood, full affect   EKG:  10/09/14-sinus bradycardia 50 with nonspecific ST-T wave changes.   Recent Labs: 02/22/2014: ALT 18 01/16/2015: BUN 11; Creatinine, Ser 1.03*; Hemoglobin 15.1*; Platelets 188; Potassium 4.0; Sodium 141    Lipid Panel No results found for: CHOL, TRIG, HDL, CHOLHDL, VLDL, LDLCALC, LDLDIRECT    Wt Readings from Last 3 Encounters:  02/17/15 130 lb 12.8 oz (59.33 kg)  01/16/15 134 lb (60.782 kg)  11/14/14 134 lb (60.782 kg)      Other studies Reviewed: Additional studies/ records that were reviewed today include: Prior hospital records, echocardiogram, EKG, lab work. Review of the above records demonstrates: as above   ASSESSMENT AND PLAN:  1.  Vasovagal syncope-will not eat very much. Encouraged salt liberalization, fluid intake. Gatorade. full neurologic workup was unremarkable. MRI unremarkable. It is likely that her single episode occurred in the setting of low blood pressure, mild dehydration, mild pain stimuli and has classic prodrome of ashen appearance, pallor which then resulted in syncope. It is not uncommon during brief episodes of syncope to have brief involuntary muscular movements or snorts as her sister described. In order to help alleviate future episodes, I have discontinued her losartan 25 mg. Her blood pressure is fairly low today at 176 systolic. Continue to maintain  hydration echocardiogram was reassuring.   2. Sleep apnea-continue to encourage CPAP. She may be a tolerate this.  3. Hypothyroidism-Synthroid  4. Prior stroke 2003-continue simvastatin, aspirin  5. Essential hypertension-currently her blood pressure was low. Stopped losartan. Increase salt intake, fluid liberalization.   Current medicines are reviewed at length with the patient today.  The patient does not have concerns regarding medicines.  The following changes have been made:  Stopping losartan. Make Colace and MiraLAX when necessary.  Labs/ tests ordered today include: echo  No orders of the defined types were placed in this encounter.     Disposition:   FU with Mayvis Agudelo in 3 months  Signed, Candee Furbish, MD  02/17/2015 11:13 AM    [facility] [address], [place],   [address] Phone: [phone]; Fax: [phone]

## 2015-03-15 ENCOUNTER — Ambulatory Visit: Payer: Medicare Other | Admitting: Podiatry

## 2015-03-27 ENCOUNTER — Encounter (HOSPITAL_COMMUNITY): Payer: Self-pay | Admitting: Emergency Medicine

## 2015-03-27 ENCOUNTER — Emergency Department (HOSPITAL_COMMUNITY)
Admission: EM | Admit: 2015-03-27 | Discharge: 2015-03-27 | Disposition: A | Discharge status: Home / Self Care | Payer: Medicare Other | Attending: Emergency Medicine | Admitting: Emergency Medicine

## 2015-03-27 ENCOUNTER — Emergency Department (HOSPITAL_COMMUNITY): Payer: Medicare Other

## 2015-03-27 DIAGNOSIS — S199XXA Unspecified injury of neck, initial encounter: Secondary | ICD-10-CM | POA: Diagnosis not present

## 2015-03-27 DIAGNOSIS — R404 Transient alteration of awareness: Secondary | ICD-10-CM | POA: Diagnosis not present

## 2015-03-27 DIAGNOSIS — R001 Bradycardia, unspecified: Secondary | ICD-10-CM | POA: Insufficient documentation

## 2015-03-27 DIAGNOSIS — W19XXXA Unspecified fall, initial encounter: Secondary | ICD-10-CM | POA: Insufficient documentation

## 2015-03-27 DIAGNOSIS — Y92091 Bathroom in other non-institutional residence as the place of occurrence of the external cause: Secondary | ICD-10-CM | POA: Insufficient documentation

## 2015-03-27 DIAGNOSIS — Z7982 Long term (current) use of aspirin: Secondary | ICD-10-CM | POA: Insufficient documentation

## 2015-03-27 DIAGNOSIS — E785 Hyperlipidemia, unspecified: Secondary | ICD-10-CM | POA: Insufficient documentation

## 2015-03-27 DIAGNOSIS — R55 Syncope and collapse: Secondary | ICD-10-CM | POA: Diagnosis not present

## 2015-03-27 DIAGNOSIS — I10 Essential (primary) hypertension: Secondary | ICD-10-CM | POA: Diagnosis not present

## 2015-03-27 DIAGNOSIS — Y998 Other external cause status: Secondary | ICD-10-CM | POA: Insufficient documentation

## 2015-03-27 DIAGNOSIS — R51 Headache: Secondary | ICD-10-CM | POA: Diagnosis not present

## 2015-03-27 DIAGNOSIS — S0990XA Unspecified injury of head, initial encounter: Secondary | ICD-10-CM | POA: Insufficient documentation

## 2015-03-27 DIAGNOSIS — Z79899 Other long term (current) drug therapy: Secondary | ICD-10-CM | POA: Diagnosis not present

## 2015-03-27 DIAGNOSIS — E039 Hypothyroidism, unspecified: Secondary | ICD-10-CM | POA: Insufficient documentation

## 2015-03-27 DIAGNOSIS — F71 Moderate intellectual disabilities: Secondary | ICD-10-CM | POA: Insufficient documentation

## 2015-03-27 DIAGNOSIS — Z8673 Personal history of transient ischemic attack (TIA), and cerebral infarction without residual deficits: Secondary | ICD-10-CM | POA: Insufficient documentation

## 2015-03-27 DIAGNOSIS — Y9389 Activity, other specified: Secondary | ICD-10-CM | POA: Diagnosis not present

## 2015-03-27 LAB — CBC WITH DIFFERENTIAL/PLATELET
BASOS PCT: 2 %
Basophils Absolute: 0.1 10*3/uL (ref 0.0–0.1)
Eosinophils Absolute: 0 10*3/uL (ref 0.0–0.7)
Eosinophils Relative: 1 %
HEMATOCRIT: 45.1 % (ref 36.0–46.0)
Hemoglobin: 15 g/dL (ref 12.0–15.0)
LYMPHS PCT: 29 %
Lymphs Abs: 1.1 10*3/uL (ref 0.7–4.0)
MCH: 31.1 pg (ref 26.0–34.0)
MCHC: 33.3 g/dL (ref 30.0–36.0)
MCV: 93.6 fL (ref 78.0–100.0)
MONOS PCT: 8 %
Monocytes Absolute: 0.3 10*3/uL (ref 0.1–1.0)
NEUTROS ABS: 2.3 10*3/uL (ref 1.7–7.7)
NEUTROS PCT: 60 %
Platelets: 190 10*3/uL (ref 150–400)
RBC: 4.82 MIL/uL (ref 3.87–5.11)
RDW: 14.1 % (ref 11.5–15.5)
WBC: 3.8 10*3/uL — ABNORMAL LOW (ref 4.0–10.5)

## 2015-03-27 LAB — BASIC METABOLIC PANEL
ANION GAP: 9 (ref 5–15)
BUN: 15 mg/dL (ref 6–20)
CO2: 25 mmol/L (ref 22–32)
Calcium: 9.4 mg/dL (ref 8.9–10.3)
Chloride: 108 mmol/L (ref 101–111)
Creatinine, Ser: 1.08 mg/dL — ABNORMAL HIGH (ref 0.44–1.00)
GFR calc non Af Amer: 56 mL/min — ABNORMAL LOW (ref >60)
Glucose, Bld: 82 mg/dL (ref 65–99)
POTASSIUM: 3.9 mmol/L (ref 3.5–5.1)
Sodium: 142 mmol/L (ref 135–145)

## 2015-03-27 MED ORDER — SODIUM CHLORIDE 0.9 % IV BOLUS (SEPSIS)
500.0000 mL | Freq: Once | INTRAVENOUS | Status: AC
  Administered 2015-03-27: 500 mL via INTRAVENOUS

## 2015-03-27 NOTE — ED Provider Notes (Signed)
CSN: [id number]     Arrival date & time 03/27/15  0844 History   First MD Initiated Contact with Patient 03/27/15 [phone]     Chief Complaint  Patient presents with  . Loss of Consciousness     (Consider location/radiation/quality/duration/timing/severity/associated sxs/prior Treatment) HPI Comments: 59 year old female with history of Down syndrome, moderate intellectual disability, hypothyroid, sleep apnea presents with mild head pain, neck pain and possible syncope. This was unwitnessed patient fell in the bathroom. Patient had baseline currently neurologically and intellectually. Patient has had a workup for syncope in the past and his systolic cardiologist. Patient has a history of low heart rate. No beta blockers. No bleeding reported. Unable to give detailed history due to intellectual disability.  Patient is a 59 y.o. female presenting with syncope. The history is provided by the patient, medical records and a caregiver.  Loss of Consciousness   Past Medical History  Diagnosis Date  . Hyperlipidemia   . Allergy   . Hypothyroidism   . Depression   . Down's syndrome   . Stroke ([place])     in 2006  . PVD (peripheral vascular disease) ([place])   . Moderate intellectual disability   . Hallucinations   . Sleep apnea   . Hypertension    Past Surgical History  Procedure Laterality Date  . Orif lt foot    . Tonsillectomy    . Colonoscopy  06/12/2012   Family History  Problem Relation Age of Onset  . Colon cancer Paternal Uncle   . Colon cancer Maternal Grandfather    Social History  Substance Use Topics  . Smoking status: Never Smoker   . Smokeless tobacco: Never Used  . Alcohol Use: No   OB History    No data available     Review of Systems  Unable to perform ROS: Other  Cardiovascular: Positive for syncope.      Allergies  Review of patient's allergies indicates no known allergies.  Home Medications   Prior to Admission medications   Medication Sig Start Date  End Date Taking? Authorizing Provider  aspirin 81 MG tablet Take 81 mg by mouth daily.    Historical Provider, MD  cholecalciferol (VITAMIN D) 1000 UNITS tablet Take 1,000 Units by mouth daily.    Historical Provider, MD  clotrimazole-betamethasone (LOTRISONE) cream Apply 1 application topically 2 (two) times daily. 12/14/14   Gardiner Barefoot, DPM  docusate sodium (COLACE) 100 MG capsule Take 1 capsule (100 mg total) by mouth daily as needed for mild constipation. 11/14/14   Jerline Pain, MD  levothyroxine (SYNTHROID, LEVOTHROID) 75 MCG tablet Take 75 mcg by mouth daily before breakfast.    Historical Provider, MD  loratadine (CLARITIN) 10 MG tablet Take 10 mg by mouth daily.    Historical Provider, MD  Polyethyl Glycol-Propyl Glycol 0.4-0.3 % SOLN Apply to eye daily.    Historical Provider, MD  polyethylene glycol (MIRALAX / GLYCOLAX) packet Take 17 g by mouth as needed for mild constipation.     Historical Provider, MD  Propylene Glycol (SYSTANE BALANCE OP) Apply 1 drop to eye daily. Place 1 drop in each eye, every day    Historical Provider, MD  simvastatin (ZOCOR) 40 MG tablet Take 40 mg by mouth at bedtime. At bedtime    Historical Provider, MD  sodium chloride (OCEAN) 0.65 % SOLN nasal spray Place 1 spray into both nostrils as needed for congestion.    Historical Provider, MD  terbinafine (LAMISIL) 1 % cream Apply 1 application topically  2 (two) times daily.    Historical Provider, MD   BP 111/75 mmHg  Pulse 52  Temp(Src) 97.8 F (36.6 C) (Oral)  Resp 20  Ht 5' (1.524 m)  SpO2 83% Physical Exam  Constitutional: She is oriented to person, place, and time. She appears well-developed and well-nourished.  HENT:  Head: Normocephalic and atraumatic.  Eyes: Right eye exhibits no discharge. Left eye exhibits no discharge.  Neck: Normal range of motion. Neck supple. No tracheal deviation present.  Cardiovascular: Regular rhythm.  Bradycardia present.   Pulmonary/Chest: Effort normal and breath  sounds normal.  Abdominal: Soft. She exhibits no distension. There is no tenderness. There is no guarding.  Musculoskeletal: She exhibits no edema.  Patient has no midline lumbar thoracic tenderness, no tenderness with range of motion of shoulders elbows hips or knees.  Neurological: She is alert and oriented to person, place, and time. GCS eye subscore is 4. GCS verbal subscore is 4. GCS motor subscore is 6.  Patient smiling, difficulty appreciating details of conversation due to intellectual disability, left eye esotropia, extra the muscle function intact, pupils equal, equal strength upper lower extremities, gross sensation intact neck supple C collar in place, very minimal tenderness paraspinal difficult to appreciate  Skin: Skin is warm. No rash noted.  Psychiatric: She has a normal mood and affect.  Nursing note and vitals reviewed.   ED Course  Procedures (including critical care time) Labs Review Labs Reviewed  BASIC METABOLIC PANEL - Abnormal; Notable for the following:    Creatinine, Ser 1.08 (*)    GFR calc non Af Amer 56 (*)    All other components within normal limits  CBC WITH DIFFERENTIAL/PLATELET - Abnormal; Notable for the following:    WBC 3.8 (*)    All other components within normal limits    Imaging Review Ct Head Wo Contrast  03/27/2015  CLINICAL DATA:  Syncope with fall this morning. Headache. History of CVA in 2008. EXAM: CT HEAD WITHOUT CONTRAST CT CERVICAL SPINE WITHOUT CONTRAST TECHNIQUE: Multidetector CT imaging of the head and cervical spine was performed following the standard protocol without intravenous contrast. Multiplanar CT image reconstructions of the cervical spine were also generated. COMPARISON:  02/22/2014 head CT. FINDINGS: CT HEAD FINDINGS No evidence of parenchymal hemorrhage or extra-axial fluid collection. No mass lesion, mass effect, or midline shift. No CT evidence of acute infarction. There is stable encephalomalacia in the left frontal  lobe, with mild ex vacuo dilatation of the frontal horn of the left lateral ventricle, unchanged. There is stable mild encephalomalacia in the right parietal lobe. Otherwise no ventriculomegaly. The visualized paranasal sinuses are essentially clear. The mastoid air cells are unopacified, noting chronic under pneumatization of the right mastoid air cells. No evidence of calvarial fracture. CT CERVICAL SPINE FINDINGS No fracture is detected in the cervical spine. No prevertebral soft tissue swelling. There is straightening of the cervical spine, usually due to positioning and/or muscle spasm. Dens is well positioned between the lateral masses of C1. The lateral masses appear well-aligned. There is moderate to severe degenerative disc disease and bilateral facet arthropathy throughout the cervical spine. There is 2 mm anterolisthesis at C3-4, C4-5, C5-6, C6-7 and C7-T1, likely degenerative. There is mild-to-moderate foraminal stenosis on the right at C4-5. Visualized mastoid air cells appear clear. No evidence of intra-axial hemorrhage in the visualized brain. No gross cervical canal hematoma. Visualized lung apices are clear. No appreciable cervical adenopathy or other cervical spine soft tissue abnormality. IMPRESSION: 1. No acute  intracranial abnormality. 2. Stable encephalomalacia in the left frontal and right parietal lobes, from remote infarcts. 3. No calvarial or cervical spine fracture. 4. Moderate to severe degenerative disc disease and facet arthropathy throughout the cervical spine. Minimal multilevel anterolisthesis throughout the cervical spine, likely degenerative in etiology. Electronically Signed   By: Ilona Sorrel M.D.   On: 03/27/2015 11:26   Ct Cervical Spine Wo Contrast  03/27/2015  CLINICAL DATA:  Syncope with fall this morning. Headache. History of CVA in 2008. EXAM: CT HEAD WITHOUT CONTRAST CT CERVICAL SPINE WITHOUT CONTRAST TECHNIQUE: Multidetector CT imaging of the head and cervical spine  was performed following the standard protocol without intravenous contrast. Multiplanar CT image reconstructions of the cervical spine were also generated. COMPARISON:  02/22/2014 head CT. FINDINGS: CT HEAD FINDINGS No evidence of parenchymal hemorrhage or extra-axial fluid collection. No mass lesion, mass effect, or midline shift. No CT evidence of acute infarction. There is stable encephalomalacia in the left frontal lobe, with mild ex vacuo dilatation of the frontal horn of the left lateral ventricle, unchanged. There is stable mild encephalomalacia in the right parietal lobe. Otherwise no ventriculomegaly. The visualized paranasal sinuses are essentially clear. The mastoid air cells are unopacified, noting chronic under pneumatization of the right mastoid air cells. No evidence of calvarial fracture. CT CERVICAL SPINE FINDINGS No fracture is detected in the cervical spine. No prevertebral soft tissue swelling. There is straightening of the cervical spine, usually due to positioning and/or muscle spasm. Dens is well positioned between the lateral masses of C1. The lateral masses appear well-aligned. There is moderate to severe degenerative disc disease and bilateral facet arthropathy throughout the cervical spine. There is 2 mm anterolisthesis at C3-4, C4-5, C5-6, C6-7 and C7-T1, likely degenerative. There is mild-to-moderate foraminal stenosis on the right at C4-5. Visualized mastoid air cells appear clear. No evidence of intra-axial hemorrhage in the visualized brain. No gross cervical canal hematoma. Visualized lung apices are clear. No appreciable cervical adenopathy or other cervical spine soft tissue abnormality. IMPRESSION: 1. No acute intracranial abnormality. 2. Stable encephalomalacia in the left frontal and right parietal lobes, from remote infarcts. 3. No calvarial or cervical spine fracture. 4. Moderate to severe degenerative disc disease and facet arthropathy throughout the cervical spine. Minimal  multilevel anterolisthesis throughout the cervical spine, likely degenerative in etiology. Electronically Signed   By: Ilona Sorrel M.D.   On: 03/27/2015 11:26   I have personally reviewed and evaluated these images and lab results as part of my medical decision-making.   EKG Interpretation   Date/Time:  Monday March 27 2015 08:48:48 EDT Ventricular Rate:  48 PR Interval:  149 QRS Duration: 81 QT Interval:  475 QTC Calculation: 424 R Axis:   71 Text Interpretation:  Sinus bradycardia Consider left ventricular  hypertrophy Confirmed by Rehan Holness  MD, Arely Tinner ([id number]) on 03/27/2015 8:53:39  AM      MDM   Final diagnoses:  Fall, initial encounter  Syncope, unspecified syncope type  Sinus bradycardia   Patient presents with unwitnessed fall possible syncope. Patient has syncope workup and saw cardiologist this year reviewed report and has follow-up with them. Patient had baseline currently. With mild pain in unwitnessed fall and difficult exam plan for CT head and neck, basic blood work, EKG similar previous sinus bradycardia. Patient stable for outpatient follow-up with primary doctor and cardiology. Patient well-appearing in the ER CT scan results reviewed no acute findings. Results and differential diagnosis were discussed with the patient/parent/guardian. Xrays were independently reviewed  by myself.  Close follow up outpatient was discussed, comfortable with the plan.   Medications  sodium chloride 0.9 % bolus 500 mL (0 mLs Intravenous Stopped 03/27/15 1058)    Filed Vitals:   03/27/15 0852 03/27/15 0930 03/27/15 1015 03/27/15 1100  BP: 102/43 110/75 105/43 111/75  Pulse: 53 51 50 52  Temp: 97.8 F (36.6 C)     TempSrc: Oral     Resp: 18 11 14 20   Height: 5' (1.524 m)     SpO2: 100% 100% 100% 83%    Final diagnoses:  Fall, initial encounter  Syncope, unspecified syncope type  Sinus bradycardia        Elnora Morrison, MD 03/29/15 [phone]

## 2015-03-27 NOTE — Discharge Instructions (Signed)
If you were given medicines take as directed.  If you are on coumadin or contraceptives realize their levels and effectiveness is altered by many different medicines.  If you have any reaction (rash, tongues swelling, other) to the medicines stop taking and see a physician.    If your blood pressure was elevated in the ER make sure you follow up for management with a primary doctor or return for chest pain, shortness of breath or stroke symptoms.  Please follow up as directed and return to the ER or see a physician for new or worsening symptoms.  Thank you. Filed Vitals:   03/27/15 0852  BP: 102/43  Pulse: 53  Temp: 97.8 F (36.6 C)  TempSrc: Oral  Resp: 18  Height: 5' (1.524 m)  SpO2: 100%

## 2015-03-27 NOTE — ED Notes (Signed)
To ED via [facility] from group home with c/o syncopal episode while in the bathroom. Pt is DOWN'S SYNDROME-- states " head hurts a little" . Has c-collar on, pt is alert/oriented to place == pt at baseline per caregivers for EMS.

## 2015-03-30 ENCOUNTER — Ambulatory Visit (INDEPENDENT_AMBULATORY_CARE_PROVIDER_SITE_OTHER): Payer: Medicare Other | Admitting: Podiatry

## 2015-03-30 DIAGNOSIS — M79675 Pain in left toe(s): Secondary | ICD-10-CM

## 2015-03-30 DIAGNOSIS — M79676 Pain in unspecified toe(s): Secondary | ICD-10-CM | POA: Diagnosis not present

## 2015-03-30 DIAGNOSIS — M79674 Pain in right toe(s): Secondary | ICD-10-CM | POA: Diagnosis not present

## 2015-03-30 DIAGNOSIS — B351 Tinea unguium: Secondary | ICD-10-CM

## 2015-03-30 NOTE — Progress Notes (Signed)
Patient ID: Amanda Castillo, female   DOB: 02/11/1956, 59 y.o.   MRN: [id number] Trim toe nails   Complaint:  Visit Type: Patient returns to my office for continued preventative foot care services. Complaint: Patient states" my nails have grown long and thick and become painful to walk and wear shoes" Patient has been diagnosed with DM with no complications. He presents for preventative foot care services. No changes to ROS  Podiatric Exam: Vascular: dorsalis pedis and posterior tibial pulses are palpable bilateral. Capillary return is immediate. Temperature gradient is WNL. Skin turgor WNL  Sensorium: Normal Semmes Weinstein monofilament test. Normal tactile sensation bilaterally. Nail Exam: Pt has thick disfigured discolored nails with subungual debris noted bilateral entire nail hallux through fifth toenails Ulcer Exam: There is no evidence of ulcer or pre-ulcerative changes or infection. Orthopedic Exam: Muscle tone and strength are WNL. No limitations in general ROM. No crepitus or effusions noted. Foot type and digits show no abnormalities. Bony prominences are unremarkable. Severe HAV B/L.   Skin: No Porokeratosis. No infection or ulcers.  Chronic tinea pedia both feet.  Diagnosis:  Tinea unguium, Pain in right toe, pain in left toes  Treatment & Plan Procedures and Treatment: Consent by patient was obtained for treatment procedures. The patient understood the discussion of treatment and procedures well. All questions were answered thoroughly reviewed. Debridement of mycotic and hypertrophic toenails, 1 through 5 bilateral and clearing of subungual debris. No ulceration, no infection noted.  Return Visit-Office Procedure: Patient instructed to return to the office for a follow up visit 3 months for continued evaluation and treatment. Prescribed lotrisone. 

## 2015-04-06 ENCOUNTER — Encounter: Payer: Self-pay | Admitting: *Deleted

## 2015-04-06 ENCOUNTER — Encounter: Payer: BLUE CROSS/BLUE SHIELD | Admitting: Cardiology

## 2015-04-06 ENCOUNTER — Ambulatory Visit (INDEPENDENT_AMBULATORY_CARE_PROVIDER_SITE_OTHER): Payer: Medicare Other | Admitting: Cardiology

## 2015-04-06 ENCOUNTER — Encounter: Payer: Self-pay | Admitting: Cardiology

## 2015-04-06 VITALS — BP 116/72 | HR 61 | Ht <= 58 in | Wt 129.0 lb

## 2015-04-06 DIAGNOSIS — Q909 Down syndrome, unspecified: Secondary | ICD-10-CM

## 2015-04-06 DIAGNOSIS — R55 Syncope and collapse: Secondary | ICD-10-CM

## 2015-04-06 NOTE — Patient Instructions (Signed)
Medication Instructions:  The current medical regimen is effective;  continue present plan and medications.  Testing/Procedures: Your physician has recommended that you wear an event monitor. Event monitors are medical devices that record the heart's electrical activity. Doctors most often [place] these monitors to diagnose arrhythmias. Arrhythmias are problems with the speed or rhythm of the heartbeat. The monitor is a small, portable device. You can wear one while you do your normal daily activities. This is usually used to diagnose what is causing palpitations/syncope (passing out).  Pt needs assistance obtaining a PCP that accepts Medicaid.  Follow-Up: Follow up approximately 6 weeks with Dr Marlou Porch. (after event monitor)  If you need a refill on your cardiac medications before your next appointment, please call your pharmacy.  Thank you for choosing [facility]!!

## 2015-04-06 NOTE — Progress Notes (Signed)
Cardiology Office Note   Date:  04/06/2015   ID:  Amanda Castillo, DOB 1955/06/26, MRN [id number]  PCP:  No primary care provider on file.  Cardiologist:   Candee Furbish, MD       History of Present Illness: Amanda Castillo is a 59 y.o. female who presents for follow-up of syncope. She had another episode of syncope which occurred after her caregiver helped her brush her teeth in the bathroom, she was standing and without warning fell backwards and was described to be out for 1 second. As opposed to the previous episode of syncope in the past, there did not appear to be any significant prodrome. No warning.   She had fainted previously at the [employer]. She was seen in the neurology clinic. She had an episode of unresponsiveness in September 2015. Lives in a group home, Down syndrome. She was with her sister with her neck extended as her sister was plucking her hair. She then started having blank look and passed out. They remember her turning grayish blue. She stiffened up, eyes rolled back and she stopped breathing. Brief CPR was done by her sister. First episode occurred in 2013 she became unresponsive briefly. Second episode in 2014 occurred in the bathroom when she was washing her face.  EEG normal, prior left frontal stroke in 2003. MRI was normal with no new stroke.  Her blood pressure previously was approximately 503 systolic on losartan 25 mg, this was discontinued. Her sister thinks that she may have been slightly dehydrated at the instance of her last syncopal episode. This was also in the setting of a slightly painful experience while getting her hair plucked. No sweats. No nausea. No chest pain. No shortness of breath.  Echocardiogram was performed on 11/22/14 and was reassuring.. Prior EKG unremarkable.  02/17/15 - had one more episode of syncope. Went to bathroom, decided to close bedroom door. This was after breakfast. EMS call, snored, woke up right away. Blood  pressure was as low as 98 systolic. [facility] - unremarkable workup. Sinus bradycardia once again noted however no significant pauses.   Past Medical History  Diagnosis Date  . Hyperlipidemia   . Allergy   . Hypothyroidism   . Depression   . Down's syndrome   . Stroke ([place])     in 2006  . PVD (peripheral vascular disease) ([place])   . Moderate intellectual disability   . Hallucinations   . Sleep apnea   . Hypertension     Past Surgical History  Procedure Laterality Date  . Orif lt foot    . Tonsillectomy    . Colonoscopy  06/12/2012     Current Outpatient Prescriptions  Medication Sig Dispense Refill  . aspirin 81 MG tablet Take 81 mg by mouth daily.    . cholecalciferol (VITAMIN D) 1000 UNITS tablet Take 1,000 Units by mouth daily.    . clotrimazole-betamethasone (LOTRISONE) cream Apply 1 application topically 2 (two) times daily. 30 g 0  . docusate sodium (COLACE) 100 MG capsule Take 1 capsule (100 mg total) by mouth daily as needed for mild constipation. 10 capsule   . levothyroxine (SYNTHROID, LEVOTHROID) 75 MCG tablet Take 75 mcg by mouth daily before breakfast.    . loratadine (CLARITIN) 10 MG tablet Take 10 mg by mouth daily.    Vladimir Faster Glycol-Propyl Glycol 0.4-0.3 % SOLN Apply to eye daily.    . polyethylene glycol (MIRALAX / GLYCOLAX) packet Take 17 g by mouth as needed  for mild constipation.     Marland Kitchen Propylene Glycol (SYSTANE BALANCE OP) Apply 1 drop to eye daily. Place 1 drop in each eye, every day    . simvastatin (ZOCOR) 40 MG tablet Take 40 mg by mouth at bedtime. At bedtime    . sodium chloride (OCEAN) 0.65 % SOLN nasal spray Place 1 spray into both nostrils as needed for congestion.    . terbinafine (LAMISIL) 1 % cream Apply 1 application topically 2 (two) times daily.     No current facility-administered medications for this visit.    Allergies:   Review of patient's allergies indicates no known allergies.    Social History:  The patient  reports that she has  never smoked. She has never used smokeless tobacco. She reports that she does not drink alcohol or use illicit drugs.   Family History:  The patient's family history includes Colon cancer in her maternal grandfather and paternal uncle.    ROS:  Please see the history of present illness.   Otherwise, review of systems are positive for none.   All other systems are reviewed and negative.    PHYSICAL EXAM: VS:  BP 116/72 mmHg  Pulse 61  Ht 4\' 10"  (1.473 m)  Wt 129 lb (58.514 kg)  BMI 26.97 kg/m2  SpO2 99% , BMI Body mass index is 26.97 kg/(m^2). GEN: Well nourished, well developed, in no acute distress, Down syndrome HEENT: normal Neck: no JVD, carotid bruits, or masses, normal carotid upstroke Cardiac: RRR; soft systolic murmur,no rubs, or gallops,no edema  Respiratory:  clear to auscultation bilaterally, normal work of breathing GI: soft, nontender, nondistended, + BS MS: no deformity or atrophy Skin: warm and dry, mild posterior rash above feet Neuro:  Strength and sensation are intact Psych: euthymic mood, full affect   EKG:  10/09/14-sinus bradycardia 50 with nonspecific ST-T wave changes.   Recent Labs: 03/27/2015: BUN 15; Creatinine, Ser 1.08*; Hemoglobin 15.0; Platelets 190; Potassium 3.9; Sodium 142    Lipid Panel No results found for: CHOL, TRIG, HDL, CHOLHDL, VLDL, LDLCALC, LDLDIRECT    Wt Readings from Last 3 Encounters:  04/06/15 129 lb (58.514 kg)  02/17/15 130 lb 12.8 oz (59.33 kg)  01/16/15 134 lb (60.782 kg)      Other studies Reviewed: Additional studies/ records that were reviewed today include: Prior hospital records, echocardiogram, EKG, lab work. Review of the above records demonstrates: as above   ASSESSMENT AND PLAN:  1.  Vasovagal syncope-recurrent episodes of syncope. Most recent episode occurred while just finishing brushing her teeth. She was in the bathroom and then without warning fell backwards, hit her head. She was only out for a second  her caregiver states. This episode differs in that was no significant prodrome or warning. She went to the emergency room and had a CT scan which was normal. EKG did show sinus bradycardia with rate in the upper 40s. Previously based upon history, it seemed as though she was having more autonomic dysfunction/vasovagal syncopal episodes. She has been drinking Gatorade or trying to incorporate this into her diet. She is also following tighter stockings.. will not eat very much. Encouraged salt liberalization, fluid intake. Gatorade. full neurologic workup was unremarkable. MRI unremarkable. It is likely that her previous episode occurred in the setting of low blood pressure, mild dehydration, mild pain stimuli and has classic prodrome of ashen appearance, pallor which then resulted in syncope. It is not uncommon during brief episodes of syncope to have brief involuntary muscular movements or  snorts as her sister described. In order to help alleviate future episodes, I have discontinued her losartan 25 mg at a prior visit. Her blood pressure was previously 734 systolic. Continue to maintain hydration echocardiogram was reassuring. Sometimes electrical disturbances can occur congenitally. I do not see lengthening of PR interval.  Discussed the possibility of implantable loop recorder as well. Consider EP consultation as well in future.  2. Sleep apnea-continue to encourage CPAP. She may be a tolerate this.  3. Hypothyroidism-Synthroid  4. Prior stroke 2003-continue simvastatin, aspirin  5. Essential hypertension-currently her blood pressure was low. Stopped losartan. Increase salt intake, fluid liberalization.   Current medicines are reviewed at length with the patient today.  The patient does not have concerns regarding medicines.  The following changes have been made:  Stopping losartan. Make Colace and MiraLAX when necessary.  Labs/ tests ordered today include: echo   Orders Placed This Encounter    Procedures  . Cardiac event monitor     Disposition:   FU with Jamesen Stahnke in, 6 weeks, after event monitor  Signed, Candee Furbish, MD  04/06/2015 9:47 AM    [facility] [place], [place], [place]  [address] Phone: [phone]; Fax: [phone]

## 2015-04-07 ENCOUNTER — Ambulatory Visit (INDEPENDENT_AMBULATORY_CARE_PROVIDER_SITE_OTHER): Payer: Medicare Other

## 2015-04-07 DIAGNOSIS — R55 Syncope and collapse: Secondary | ICD-10-CM

## 2015-04-13 ENCOUNTER — Telehealth: Payer: Self-pay | Admitting: Cardiology

## 2015-04-13 NOTE — Telephone Encounter (Signed)
Spoke with caretaker who reports pt wore the monitor for only a short time.  She took it off and refused to put it back on.  It has been mailed back in.  Caretaker aware I will notify Dr Marlou Porch.  Pt will keep appt as scheduled 12/6.  They will call back if concerns prior to then.

## 2015-04-13 NOTE — Telephone Encounter (Signed)
New Message    Pt's caregiver called stating that pt received a heart monitor on 04/06/15 and pt refused to wear it so it has been mailed back to the company. Pt's caregiver is requesting a call back from a nurse.

## 2015-04-18 NOTE — Telephone Encounter (Signed)
Thanks for update. I will reevaluate on 12/6 and consider EP referral for linq.   Candee Furbish, MD

## 2015-05-12 ENCOUNTER — Ambulatory Visit: Payer: Federal, State, Local not specified - PPO | Admitting: Neurology

## 2015-05-16 ENCOUNTER — Ambulatory Visit (INDEPENDENT_AMBULATORY_CARE_PROVIDER_SITE_OTHER): Payer: Medicare Other | Admitting: Cardiology

## 2015-05-16 ENCOUNTER — Encounter: Payer: Self-pay | Admitting: *Deleted

## 2015-05-16 ENCOUNTER — Encounter: Payer: Self-pay | Admitting: Cardiology

## 2015-05-16 VITALS — BP 124/84 | HR 66 | Ht 59.0 in | Wt 126.1 lb

## 2015-05-16 DIAGNOSIS — R55 Syncope and collapse: Secondary | ICD-10-CM

## 2015-05-16 NOTE — Patient Instructions (Signed)
Medication Instructions:  The current medical regimen is effective;  continue present plan and medications.  Follow-Up: Follow up in 6 months with Dr. Marlou Porch.  You will receive a letter in the mail 2 months before you are due.  Please call us when you receive this letter to schedule your follow up appointment.  Please use knee high compression daily.  If you need a refill on your cardiac medications before your next appointment, please call your pharmacy.  Thank you for choosing [facility]!!

## 2015-05-16 NOTE — Progress Notes (Signed)
Cardiology Office Note   Date:  05/16/2015   ID:  Amanda Castillo, DOB 05-03-1956, MRN [id number]  PCP:  No PCP Per Patient  Cardiologist:   Candee Furbish, MD       History of Present Illness: Amanda Castillo is a 59 y.o. female who presents for follow-up of syncope. She had another episode of syncope which occurred after her caregiver helped her brush her teeth in the bathroom, she was standing and without warning fell backwards and was described to be out for 1 second. As opposed to the previous episode of syncope in the past, there did not appear to be any significant prodrome. No warning.   She had fainted previously at the [employer]. She was seen in the neurology clinic. She had an episode of unresponsiveness in September 2015. Lives in a group home, Down syndrome. She was with her sister with her neck extended as her sister was plucking her hair. She then started having blank look and passed out. They remember her turning grayish blue. She stiffened up, eyes rolled back and she stopped breathing. Brief CPR was done by her sister. First episode occurred in 2013 she became unresponsive briefly. Second episode in 2014 occurred in the bathroom when she was washing her face.  EEG normal, prior left frontal stroke in 2003. MRI was normal with no new stroke.  Her blood pressure previously was approximately 123XX123 systolic on losartan 25 mg, this was discontinued. Her sister thinks that she may have been slightly dehydrated at the instance of her last syncopal episode. This was also in the setting of a slightly painful experience while getting her hair plucked. No sweats. No nausea. No chest pain. No shortness of breath.  Echocardiogram was performed on 11/22/14 and was reassuring.. Prior EKG unremarkable.  02/17/15 - had one more episode of syncope. Went to bathroom, decided to close bedroom door. This was after breakfast. EMS call, snored, woke up right away. Blood pressure was as low  as 98 systolic. [facility] - unremarkable workup. Sinus bradycardia once again noted however no significant pauses.  05/16/15-doing well, no further syncope episodes, event monitor reassuring for length of time she wore.   Past Medical History  Diagnosis Date  . Hyperlipidemia   . Allergy   . Hypothyroidism   . Depression   . Down's syndrome   . Stroke ([place])     in 2006  . PVD (peripheral vascular disease) ([place])   . Moderate intellectual disability   . Hallucinations   . Sleep apnea   . Hypertension     Past Surgical History  Procedure Laterality Date  . Orif lt foot    . Tonsillectomy    . Colonoscopy  06/12/2012     Current Outpatient Prescriptions  Medication Sig Dispense Refill  . aspirin 81 MG tablet Take 81 mg by mouth daily.    . cholecalciferol (VITAMIN D) 1000 UNITS tablet Take 1,000 Units by mouth daily.    . clotrimazole-betamethasone (LOTRISONE) cream Apply 1 application topically 2 (two) times daily. 30 g 0  . docusate sodium (COLACE) 100 MG capsule Take 1 capsule (100 mg total) by mouth daily as needed for mild constipation. 10 capsule   . levothyroxine (SYNTHROID, LEVOTHROID) 75 MCG tablet Take 75 mcg by mouth daily before breakfast.    . loratadine (CLARITIN) 10 MG tablet Take 10 mg by mouth daily.    Vladimir Faster Glycol-Propyl Glycol 0.4-0.3 % SOLN Apply to eye daily.    Marland Kitchen  polyethylene glycol (MIRALAX / GLYCOLAX) packet Take 17 g by mouth as needed for mild constipation.     Marland Kitchen Propylene Glycol (SYSTANE BALANCE OP) Apply 1 drop to eye daily. Place 1 drop in each eye, every day    . simvastatin (ZOCOR) 40 MG tablet Take 40 mg by mouth at bedtime. At bedtime    . sodium chloride (OCEAN) 0.65 % SOLN nasal spray Place 1 spray into both nostrils as needed for congestion.    . terbinafine (LAMISIL) 1 % cream Apply 1 application topically 2 (two) times daily.     No current facility-administered medications for this visit.    Allergies:   Review of patient's allergies  indicates no known allergies.    Social History:  The patient  reports that she has never smoked. She has never used smokeless tobacco. She reports that she does not drink alcohol or use illicit drugs.   Family History:  The patient's family history includes Colon cancer in her maternal grandfather and paternal uncle.    ROS:  Please see the history of present illness.   Otherwise, review of systems are positive for none.   All other systems are reviewed and negative.    PHYSICAL EXAM: VS:  There were no vitals taken for this visit. , BMI There is no weight on file to calculate BMI. GEN: Well nourished, well developed, in no acute distress, Down syndrome HEENT: normal Neck: no JVD, carotid bruits, or masses, normal carotid upstroke Cardiac: RRR; soft systolic murmur,no rubs, or gallops,no edema  Respiratory:  clear to auscultation bilaterally, normal work of breathing GI: soft, nontender, nondistended, + BS MS: no deformity or atrophy Skin: warm and dry, mild posterior rash above feet Neuro:  Strength and sensation are intact Psych: euthymic mood, full affect   EKG:  10/09/14-sinus bradycardia 50 with nonspecific ST-T wave changes.   Recent Labs: 03/27/2015: BUN 15; Creatinine, Ser 1.08*; Hemoglobin 15.0; Platelets 190; Potassium 3.9; Sodium 142    Lipid Panel No results found for: CHOL, TRIG, HDL, CHOLHDL, VLDL, LDLCALC, LDLDIRECT    Wt Readings from Last 3 Encounters:  04/06/15 129 lb (58.514 kg)  02/17/15 130 lb 12.8 oz (59.33 kg)  01/16/15 134 lb (60.782 kg)      Other studies Reviewed: Additional studies/ records that were reviewed today include: Prior hospital records, echocardiogram, EKG, lab work. Review of the above records demonstrates: as above   ASSESSMENT AND PLAN:  1.  Vasovagal syncope-recurrent episodes of syncope. Most recent episode occurred while just finishing brushing her teeth. She was in the bathroom and then without warning fell backwards, hit  her head. She was only out for a second her caregiver states. This episode differs in that was no significant prodrome or warning. She went to the emergency room and had a CT scan which was normal. EKG did show sinus bradycardia with rate in the upper 40s. Previously based upon history, it seemed as though she was having more autonomic dysfunction/vasovagal syncopal episodes. She has been drinking Gatorade or trying to incorporate this into her diet. She is also following tighter stockings.. will not eat very much. Compression hose. Encouraged salt liberalization, fluid intake. Gatorade. full neurologic workup was unremarkable. MRI unremarkable. It is likely that her previous episode occurred in the setting of low blood pressure, mild dehydration, mild pain stimuli and has classic prodrome of ashen appearance, pallor which then resulted in syncope. It is not uncommon during brief episodes of syncope to have brief involuntary muscular movements  or snorts as her sister described. In order to help alleviate future episodes, I have discontinued her losartan 25 mg at a prior visit. Her blood pressure was previously 123XX123 systolic. Continue to maintain hydration echocardiogram was reassuring. Sometimes electrical disturbances can occur congenitally. I do not see lengthening of PR interval.  Event monitor in October 2016 showed no adverse arrhythmias.  Discussed the possibility of implantable loop recorder as well. Consider EP consultation as well in future. I would like to continue with conservative management at this point.  2. Sleep apnea-continue to encourage CPAP. She may be a tolerate this.  3. Hypothyroidism-Synthroid  4. Prior stroke 2003-continue simvastatin, aspirin  5. Essential hypertension-currently her blood pressure was low. Stopped losartan. Increase salt intake, fluid liberalization.   Current medicines are reviewed at length with the patient today.  The patient does not have concerns  regarding medicines.  The following changes have been made:  Stopping losartan. Make Colace and MiraLAX when necessary.  Labs/ tests ordered today include: echo   No orders of the defined types were placed in this encounter.     Disposition:   FU with Ezana Hubbert in, 6 months  Signed, Candee Furbish, MD  05/16/2015 8:50 AM    [facility] [place], [place], [place]  [address] Phone: [phone]; Fax: [phone]

## 2015-05-17 ENCOUNTER — Encounter: Payer: Self-pay | Admitting: Neurology

## 2015-05-17 ENCOUNTER — Ambulatory Visit (INDEPENDENT_AMBULATORY_CARE_PROVIDER_SITE_OTHER): Payer: Medicare Other | Admitting: Neurology

## 2015-05-17 VITALS — BP 118/78 | HR 66 | Resp 14 | Ht 59.0 in | Wt 130.0 lb

## 2015-05-17 DIAGNOSIS — R55 Syncope and collapse: Secondary | ICD-10-CM | POA: Diagnosis not present

## 2015-05-17 DIAGNOSIS — F0281 Dementia in other diseases classified elsewhere with behavioral disturbance: Secondary | ICD-10-CM | POA: Diagnosis not present

## 2015-05-17 DIAGNOSIS — Q909 Down syndrome, unspecified: Secondary | ICD-10-CM

## 2015-05-17 DIAGNOSIS — F02818 Dementia in other diseases classified elsewhere, unspecified severity, with other behavioral disturbance: Secondary | ICD-10-CM

## 2015-05-17 NOTE — Patient Instructions (Signed)
1. Continue follow-up with Cardiology 2. 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 Follow-up in 1 year

## 2015-05-17 NOTE — Progress Notes (Signed)
NEUROLOGY FOLLOW UP OFFICE NOTE  Amanda Castillo [id number]  HISTORY OF PRESENT ILLNESS: I had the pleasure of seeing Amanda Castillo in follow-up in the neurology clinic on 05/17/2015.  The patient was last seen 6 months ago for syncope. She is again accompanied by home health staff today who help supplement the history. Since her last visit, she has had 2 more syncopal episodes and has been evaluated by Cardiology, with note of bradycardia during events, but no pauses. The episode in August occurred in the bathroom, she woke up as soon as falling Amanda Castillo, BP was noted to be low at 98 systolic. The last episode occurred in October, again in the bathroom after brushing her teeth, she fells backwards to the floor and promptly woke up.  She did not tolerate prolonged Holter, results from recording were unremarkable.   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 She keeps taking her clothes out of the closet and packs them away repeatedly. She would tell her caregiver she had already eaten, when in fact she had not. She can usually be prompted to eat, but sometimes needs to be fed. She needs assistance with bathing and dressing, sometimes getting clothes that do not match. She has occasional behavioral changes, one time she got upset about having the Holter monitor on and kept staff up all night going in and out of the bathroom and slamming doors, sometimes spitting on the floor. She would keep water running, and has to be the one to turn it off, doing this repetitively. She is eating but still is losing weight. Her sister has raised concern that Amanda Castillo is depressed and has been considering seeing Psychiatry. Amanda Castillo denies any pain, no falls.  HPI: This is a pleasant 59 yo woman with a history of Amanda Castillo syndrome with cognitive impairment, hypertension, hyperlipidemia, left frontal stroke in 2003, with recurrent episodes of loss of consciousness. She  presented initially after an episode of unresponsiveness last 02/22/2014. Per group home staff, Amanda Castillo was being assisted in the bathroom, sitting on the commode when she blanked out. Staff was trying to hold her up. Per ER notes, she was standing and developed shaking of both arm and legs. They sat her Amanda Castillo on the toilet and she was shaking for about 1 minute. They noted she was nonverbal. She had a mild abrasion in her inner lip that appeared to be new per facility. She complained of a mild headache in the ER but was back to baseline. No recent infections. Glucose normal per EMS. Her CBC showed a WBC of 13.2, otherwise normal. CMP normal. She had a head CT without contrast which I personally reviewed, no acute changes, there was an old left frontal infarct. Her sister reports that this is the third episode of loss of consciousness, however she has no prior history of seizures. The first episode occurred in 2013 when she became unresponsive briefly. The second occurred in 05/2013, she was in the bathroom and lost consciousness while washing her face. With the stroke in 2003, she had been living with her mother and was noted to have a faraway look then fell to her knees and started shaking, all she could say was "mommy, mommy."  She had another episode with her sister in the bathroom on 10/09/14, with her neck extended as her sister was plucking her chin hairs. She then started having a blank look and then passed out. She turned grayish blue, her sister helped her  Amanda Castillo to the floor, where she stiffened with eyes rolled up and stopped breathing. Her sister did CPR with 3 chest compressions then she woke up with no post-event confusion.   There is no prior history of seizures. Her sister and staff have not noticed any other episodes of staring/unresponsiveness or myoclonic jerks. Her sister states that Amanda Castillo was moved to a new group home and has not been doing well there, with a change in behavior. She would  mimic her co-residents, and now makes different facial expressions and mouth movements. Group home staff stated that they do not notice this change in behavior themselves. Amanda Castillo has some difficulty expressing herself, denying any headaches, dizziness, diplopia, dysarthria, dysphagia, neck/back pain, focal numbness/tingling/weakness.  According to her sister, Amanda Castillo had an uneventful delivery, delayed development with Amanda Castillo syndrome. There is no history of febrile convulsions, CNS infections such as meningitis/encephalitis, significant traumatic brain injury, neurosurgical procedures, or family history of seizures.  Diagnostic Data: EEG 12/29/12: This EEG could be read within normal limits for age and levels of consciousness. There was a 2- second burst of high voltage slowing which occurred at the end of photic stimulation. It's difficult to know what this means. Overall this was a normal tracing, done in awake without drowsiness or sleep captured. EEG 10/06/13: Ambulatory EEG terminated around the 12th hour. There is left temporal delta slowing seen. No epileptiform discharges seen.  EEG 11/16/13: This EEG is normal for age and levels of consciousness done primarily in awake but in a finely relaxed patient. MRI brain without contrast 03/2014 did not show any acute changes. There was a remote left frontal lobe infarct with ex vacuo dilation of the left lateral ventricle, and a smaller remote right parietal infarct. Mild generalized atrophy.  PAST MEDICAL HISTORY: Past Medical History  Diagnosis Date  . Hyperlipidemia   . Allergy   . Hypothyroidism   . Depression   . Amanda Castillo's syndrome   . Stroke ([place])     in 2006  . PVD (peripheral vascular disease) ([place])   . Moderate intellectual disability   . Hallucinations   . Sleep apnea   . Hypertension     MEDICATIONS: Current Outpatient Prescriptions on File Prior to Visit  Medication Sig Dispense Refill  . aspirin 81 MG tablet Take 81 mg by  mouth daily.    . cholecalciferol (VITAMIN D) 1000 UNITS tablet Take 1,000 Units by mouth daily.    Marland Kitchen docusate sodium (COLACE) 100 MG capsule Take 1 capsule (100 mg total) by mouth daily as needed for mild constipation. 10 capsule   . levothyroxine (SYNTHROID, LEVOTHROID) 75 MCG tablet Take 75 mcg by mouth daily before breakfast.    . loratadine (CLARITIN) 10 MG tablet Take 10 mg by mouth daily.    Marland Kitchen Propylene Glycol (SYSTANE BALANCE OP) Apply 1 drop to eye daily. Place 1 drop in each eye, every day    . simvastatin (ZOCOR) 40 MG tablet Take 40 mg by mouth at bedtime. At bedtime    . sodium chloride (OCEAN) 0.65 % SOLN nasal spray Place 1 spray into both nostrils as needed for congestion.    . terbinafine (LAMISIL) 1 % cream Apply 1 application topically 2 (two) times daily.     No current facility-administered medications on file prior to visit.    ALLERGIES: No Known Allergies  FAMILY HISTORY: Family History  Problem Relation Age of Onset  . Colon cancer Paternal Uncle   . Colon cancer Maternal Grandfather  SOCIAL HISTORY: Social History   Social History  . Marital Status: Single    Spouse Name: N/A  . Number of Children: N/A  . Years of Education: N/A   Occupational History  . Not on file.   Social History Main Topics  . Smoking status: Never Smoker   . Smokeless tobacco: Never Used  . Alcohol Use: No  . Drug Use: No  . Sexual Activity: Not on file   Other Topics Concern  . Not on file   Social History Narrative    REVIEW OF SYSTEMS limited due to mental status, patient denies any symptoms as below: Constitutional: No fevers, chills, or sweats, no generalized fatigue, change in appetite Eyes: No visual changes, double vision, eye pain Ear, nose and throat: No hearing loss, ear pain, nasal congestion, sore throat Cardiovascular: No chest pain, palpitations Respiratory:  No shortness of breath at rest or with exertion, wheezes GastrointestinaI: No nausea,  vomiting, diarrhea, abdominal pain, fecal incontinence Genitourinary:  No dysuria, urinary retention or frequency Musculoskeletal:  No neck pain, back pain Integumentary: No rash, pruritus, skin lesions Neurological: as above Psychiatric: No depression, insomnia, anxiety Endocrine: No palpitations, fatigue, diaphoresis, mood swings, change in appetite, change in weight, increased thirst Hematologic/Lymphatic:  No anemia, purpura, petechiae. Allergic/Immunologic: no itchy/runny eyes, nasal congestion, recent allergic reactions, rashes  PHYSICAL EXAM: Filed Vitals:   05/17/15 1412  BP: 118/78  Pulse: 66  Resp: 14   General:No acute distress, Amanda Castillo syndrome facies Head: Normocephalic/atraumatic Eyes: Fundoscopic exam shows bilateral sharp discs, no vessel changes, exudates, or hemorrhages Neck: supple, no paraspinal tenderness, full range of motion Back: No paraspinal tenderness Heart: regular rate and rhythm Lungs: Clear to auscultation bilaterally. Vascular: No carotid bruits. Skin/Extremities: No rash, no edema Neurological Exam: Mental status: alert and oriented to person, place, chooses correct month with multiple choice and answers correct upcoming holiday, mild dysarthria, no aphasia, Fund of knowledge is reduced. Recent and remote memory are impaired, patient mostly says yes to all questions (similar to prior). 0/3 delayed recall. Attention and concentration are normal. Able to name objects and repeat phrases. Cranial nerves: CN I: not tested CN II: pupils equal, round and reactive to light, visual fields intact, fundi unremarkable. CN III, IV, VI: full range of motion, no nystagmus, no ptosis CN V: facial sensation intact CN VII: upper and lower face symmetric CN VIII: hearing intact to finger rub CN IX, X: uvula midline CN XI: sternocleidomastoid and trapezius muscles intact CN XII: tongue midline Bulk & Tone: hypotonic, no fasciculations. Motor: 5/5 throughout  with no pronator drift. Sensation: intact to light touch. No extinction to double simultaneous stimulation. 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 She continues to follow-up with Cardiology. From a neurologic standpoint, MRI brain did not show any new stroke. She does have a history of prior strokes which may predispose her to seizures, however all testing to date (multiple EEGs) have not shown any clear evidence for epilepsy. Main concern today is for dementia associated with Amanda Castillo syndrome, which by history obtained from caregiver, appears to be the case. There is conflicting evidence about  use of cholinesterase inhibitors such as Aricept for Amanda Castillo syndrome, I would be cautious particularly since she is having syncopal spells undergoing cardiac workup. The behavioral changes are most concerning for staff, with repetitive behavior noted above. She will be referred to Psychiatry for evaluation and treatment as they see fit for behavioral changes. She will follow-up in 1 year or earlier if needed.   Thank you for allowing me to participate in her care.  Please do not hesitate to call for any questions or concerns.  The duration of this appointment visit was 25 minutes of face-to-face time with the patient.  Greater than 50% of this time was spent in counseling, explanation of diagnosis, planning of further management, and coordination of care.   Ellouise Newer, M.D.   CC: Dr. Marlou Porch

## 2015-05-18 DIAGNOSIS — F0281 Dementia in other diseases classified elsewhere with behavioral disturbance: Secondary | ICD-10-CM | POA: Insufficient documentation

## 2015-05-18 DIAGNOSIS — F02818 Dementia in other diseases classified elsewhere, unspecified severity, with other behavioral disturbance: Secondary | ICD-10-CM | POA: Insufficient documentation

## 2015-05-22 ENCOUNTER — Ambulatory Visit: Payer: Medicare Other | Admitting: Cardiology

## 2015-06-06 ENCOUNTER — Encounter (HOSPITAL_COMMUNITY): Payer: Self-pay

## 2015-06-06 ENCOUNTER — Inpatient Hospital Stay (HOSPITAL_COMMUNITY)
Admission: EM | Admit: 2015-06-06 | Discharge: 2015-06-07 | DRG: 312 | Disposition: A | Discharge status: Home / Self Care | Payer: Medicare Other | Attending: Oncology | Admitting: Oncology

## 2015-06-06 ENCOUNTER — Other Ambulatory Visit: Payer: Self-pay

## 2015-06-06 ENCOUNTER — Emergency Department (HOSPITAL_COMMUNITY): Payer: Medicare Other

## 2015-06-06 DIAGNOSIS — F0281 Dementia in other diseases classified elsewhere with behavioral disturbance: Secondary | ICD-10-CM | POA: Diagnosis not present

## 2015-06-06 DIAGNOSIS — F71 Moderate intellectual disabilities: Secondary | ICD-10-CM | POA: Diagnosis not present

## 2015-06-06 DIAGNOSIS — E039 Hypothyroidism, unspecified: Secondary | ICD-10-CM | POA: Diagnosis present

## 2015-06-06 DIAGNOSIS — Z79899 Other long term (current) drug therapy: Secondary | ICD-10-CM

## 2015-06-06 DIAGNOSIS — Z8673 Personal history of transient ischemic attack (TIA), and cerebral infarction without residual deficits: Secondary | ICD-10-CM

## 2015-06-06 DIAGNOSIS — G4730 Sleep apnea, unspecified: Secondary | ICD-10-CM | POA: Diagnosis not present

## 2015-06-06 DIAGNOSIS — R55 Syncope and collapse: Secondary | ICD-10-CM | POA: Diagnosis not present

## 2015-06-06 DIAGNOSIS — E860 Dehydration: Secondary | ICD-10-CM | POA: Diagnosis not present

## 2015-06-06 DIAGNOSIS — S0990XA Unspecified injury of head, initial encounter: Secondary | ICD-10-CM | POA: Diagnosis not present

## 2015-06-06 DIAGNOSIS — I10 Essential (primary) hypertension: Secondary | ICD-10-CM | POA: Diagnosis not present

## 2015-06-06 DIAGNOSIS — I951 Orthostatic hypotension: Principal | ICD-10-CM

## 2015-06-06 DIAGNOSIS — N390 Urinary tract infection, site not specified: Secondary | ICD-10-CM | POA: Diagnosis present

## 2015-06-06 DIAGNOSIS — R001 Bradycardia, unspecified: Secondary | ICD-10-CM | POA: Diagnosis present

## 2015-06-06 DIAGNOSIS — Q909 Down syndrome, unspecified: Secondary | ICD-10-CM | POA: Diagnosis not present

## 2015-06-06 DIAGNOSIS — I739 Peripheral vascular disease, unspecified: Secondary | ICD-10-CM | POA: Diagnosis present

## 2015-06-06 DIAGNOSIS — Z7982 Long term (current) use of aspirin: Secondary | ICD-10-CM | POA: Diagnosis not present

## 2015-06-06 DIAGNOSIS — K5900 Constipation, unspecified: Secondary | ICD-10-CM | POA: Diagnosis present

## 2015-06-06 DIAGNOSIS — R220 Localized swelling, mass and lump, head: Secondary | ICD-10-CM | POA: Diagnosis not present

## 2015-06-06 DIAGNOSIS — R634 Abnormal weight loss: Secondary | ICD-10-CM | POA: Diagnosis not present

## 2015-06-06 DIAGNOSIS — E785 Hyperlipidemia, unspecified: Secondary | ICD-10-CM | POA: Diagnosis present

## 2015-06-06 DIAGNOSIS — Z6825 Body mass index (BMI) 25.0-25.9, adult: Secondary | ICD-10-CM | POA: Diagnosis not present

## 2015-06-06 DIAGNOSIS — F02818 Dementia in other diseases classified elsewhere, unspecified severity, with other behavioral disturbance: Secondary | ICD-10-CM | POA: Diagnosis present

## 2015-06-06 DIAGNOSIS — R404 Transient alteration of awareness: Secondary | ICD-10-CM | POA: Diagnosis not present

## 2015-06-06 DIAGNOSIS — R9082 White matter disease, unspecified: Secondary | ICD-10-CM | POA: Diagnosis not present

## 2015-06-06 HISTORY — DX: Orthostatic hypotension: I95.1

## 2015-06-06 LAB — BASIC METABOLIC PANEL
Anion gap: 9 (ref 5–15)
BUN: 15 mg/dL (ref 6–20)
CHLORIDE: 106 mmol/L (ref 101–111)
CO2: 27 mmol/L (ref 22–32)
CREATININE: 1.09 mg/dL — AB (ref 0.44–1.00)
Calcium: 9.2 mg/dL (ref 8.9–10.3)
GFR, EST NON AFRICAN AMERICAN: 55 mL/min — AB (ref >60)
Glucose, Bld: 125 mg/dL — ABNORMAL HIGH (ref 65–99)
POTASSIUM: 3.7 mmol/L (ref 3.5–5.1)
SODIUM: 142 mmol/L (ref 135–145)

## 2015-06-06 LAB — URINALYSIS, ROUTINE W REFLEX MICROSCOPIC
Glucose, UA: NEGATIVE mg/dL
Hgb urine dipstick: NEGATIVE
Ketones, ur: 15 mg/dL — AB
Nitrite: NEGATIVE
Protein, ur: NEGATIVE mg/dL
Specific Gravity, Urine: 1.03 (ref 1.005–1.030)
pH: 5 (ref 5.0–8.0)

## 2015-06-06 LAB — CBC
HEMATOCRIT: 42.7 % (ref 36.0–46.0)
Hemoglobin: 14.1 g/dL (ref 12.0–15.0)
MCH: 31.2 pg (ref 26.0–34.0)
MCHC: 33 g/dL (ref 30.0–36.0)
MCV: 94.5 fL (ref 78.0–100.0)
PLATELETS: 194 10*3/uL (ref 150–400)
RBC: 4.52 MIL/uL (ref 3.87–5.11)
RDW: 14.3 % (ref 11.5–15.5)
WBC: 3.6 10*3/uL — AB (ref 4.0–10.5)

## 2015-06-06 LAB — URINE MICROSCOPIC-ADD ON: RBC / HPF: NONE SEEN RBC/hpf (ref 0–5)

## 2015-06-06 LAB — TSH: TSH: 0.072 u[IU]/mL — AB (ref 0.350–4.500)

## 2015-06-06 MED ORDER — ENOXAPARIN SODIUM 40 MG/0.4ML ~~LOC~~ SOLN
40.0000 mg | SUBCUTANEOUS | Status: DC
  Administered 2015-06-06: 40 mg via SUBCUTANEOUS
  Filled 2015-06-06: qty 0.4

## 2015-06-06 MED ORDER — CEFTRIAXONE SODIUM 1 G IJ SOLR
1.0000 g | Freq: Once | INTRAMUSCULAR | Status: AC
  Administered 2015-06-06: 1 g via INTRAVENOUS
  Filled 2015-06-06: qty 10

## 2015-06-06 MED ORDER — DOCUSATE SODIUM 100 MG PO CAPS
100.0000 mg | ORAL_CAPSULE | Freq: Every day | ORAL | Status: DC
  Administered 2015-06-07: 100 mg via ORAL
  Filled 2015-06-06: qty 1

## 2015-06-06 MED ORDER — ONDANSETRON HCL 4 MG PO TABS: 4.0000 mg | ORAL_TABLET | Freq: Four times a day (QID) | ORAL | Status: DC | PRN

## 2015-06-06 MED ORDER — SODIUM CHLORIDE 0.9 % IJ SOLN
3.0000 mL | Freq: Two times a day (BID) | INTRAMUSCULAR | Status: DC
  Administered 2015-06-06 – 2015-06-07 (×2): 3 mL via INTRAVENOUS

## 2015-06-06 MED ORDER — SALINE SPRAY 0.65 % NA SOLN
1.0000 | NASAL | Status: DC | PRN
  Filled 2015-06-06: qty 44

## 2015-06-06 MED ORDER — VITAMIN D 1000 UNITS PO TABS
1000.0000 [IU] | ORAL_TABLET | Freq: Every day | ORAL | Status: DC
  Administered 2015-06-07: 1000 [IU] via ORAL
  Filled 2015-06-06: qty 1

## 2015-06-06 MED ORDER — POTASSIUM CHLORIDE IN NACL 20-0.9 MEQ/L-% IV SOLN
INTRAVENOUS | Status: AC
  Administered 2015-06-06: 18:00:00 via INTRAVENOUS
  Filled 2015-06-06: qty 1000

## 2015-06-06 MED ORDER — LORATADINE 10 MG PO TABS
10.0000 mg | ORAL_TABLET | Freq: Every day | ORAL | Status: DC
  Administered 2015-06-07: 10 mg via ORAL
  Filled 2015-06-06: qty 1

## 2015-06-06 MED ORDER — FOSFOMYCIN TROMETHAMINE 3 G PO PACK
3.0000 g | PACK | Freq: Once | ORAL | Status: AC
  Administered 2015-06-06: 3 g via ORAL
  Filled 2015-06-06: qty 3

## 2015-06-06 MED ORDER — ASPIRIN EC 81 MG PO TBEC
81.0000 mg | DELAYED_RELEASE_TABLET | Freq: Every day | ORAL | Status: DC
  Administered 2015-06-07: 81 mg via ORAL
  Filled 2015-06-06 (×2): qty 1

## 2015-06-06 MED ORDER — SIMVASTATIN 40 MG PO TABS
40.0000 mg | ORAL_TABLET | Freq: Every day | ORAL | Status: DC
  Administered 2015-06-06: 40 mg via ORAL
  Filled 2015-06-06: qty 1

## 2015-06-06 MED ORDER — SODIUM CHLORIDE 0.9 % IV BOLUS (SEPSIS)
1000.0000 mL | Freq: Once | INTRAVENOUS | Status: AC
  Administered 2015-06-06: 1000 mL via INTRAVENOUS

## 2015-06-06 MED ORDER — ONDANSETRON HCL 4 MG/2ML IJ SOLN: 4.0000 mg | Freq: Four times a day (QID) | INTRAMUSCULAR | Status: DC | PRN

## 2015-06-06 MED ORDER — LEVOTHYROXINE SODIUM 75 MCG PO TABS
75.0000 ug | ORAL_TABLET | Freq: Every day | ORAL | Status: DC
  Administered 2015-06-07: 75 ug via ORAL
  Filled 2015-06-06: qty 1

## 2015-06-06 NOTE — Progress Notes (Signed)
RT note: RT explained to patient about CPAP and patient wasn't understanding what CPAP was. RT showed pt mask and she was confused. RT didn't apply CPAP to patient.  RT will continue to monitor.

## 2015-06-06 NOTE — ED Provider Notes (Signed)
CSN: [id number]     Arrival date & time 06/06/15  0930 History   First MD Initiated Contact with Patient 06/06/15 1024     Chief Complaint  Patient presents with  . Near Syncope     (Consider location/radiation/quality/duration/timing/severity/associated sxs/prior Treatment) HPI Patient presents to the emergency department with syncope that occurred twice this morning.  The patient does admit eating or drinking as well over the last few days.  Patient had 2 syncopal episodes of the second being where she hit the edge of a door frame side of her face and has swelling and bruising noted and has Down syndrome is unable to give me any history.  History is coming from the caretaker and the sister.  The patient is not having complaints of chest pain, shortness of breath, nausea, vomiting, diarrhea, abdominal pain, fever, cough, weakness, dizziness, headache, blurred vision, or rash Past Medical History  Diagnosis Date  . Hyperlipidemia   . Allergy   . Hypothyroidism   . Depression   . Down's syndrome   . Stroke ([place])     in 2006  . PVD (peripheral vascular disease) ([place])   . Moderate intellectual disability   . Hallucinations   . Sleep apnea   . Hypertension    Past Surgical History  Procedure Laterality Date  . Orif lt foot    . Tonsillectomy    . Colonoscopy  06/12/2012   Family History  Problem Relation Age of Onset  . Colon cancer Paternal Uncle   . Colon cancer Maternal Grandfather    Social History  Substance Use Topics  . Smoking status: Never Smoker   . Smokeless tobacco: Never Used  . Alcohol Use: No   OB History    No data available     Review of Systems Level V caveat applies due to mental retardation   Allergies  Review of patient's allergies indicates no known allergies.  Home Medications   Prior to Admission medications   Medication Sig Start Date End Date Taking? Authorizing Provider  aspirin 81 MG tablet Take 81 mg by mouth daily.   Yes Historical  Provider, MD  cholecalciferol (VITAMIN D) 1000 UNITS tablet Take 1,000 Units by mouth daily.   Yes Historical Provider, MD  docusate sodium (COLACE) 100 MG capsule Take 1 capsule (100 mg total) by mouth daily as needed for mild constipation. 11/14/14  Yes Jerline Pain, MD  levothyroxine (SYNTHROID, LEVOTHROID) 75 MCG tablet Take 75 mcg by mouth daily before breakfast.   Yes Historical Provider, MD  loratadine (CLARITIN) 10 MG tablet Take 10 mg by mouth daily.   Yes Historical Provider, MD  Propylene Glycol (SYSTANE BALANCE OP) Apply 1 drop to eye daily. Place 1 drop in each eye, every day   Yes Historical Provider, MD  simvastatin (ZOCOR) 40 MG tablet Take 40 mg by mouth at bedtime. At bedtime   Yes Historical Provider, MD  sodium chloride (OCEAN) 0.65 % SOLN nasal spray Place 1 spray into both nostrils as needed for congestion.   Yes Historical Provider, MD  terbinafine (LAMISIL) 1 % cream Apply 1 application topically 2 (two) times daily.   Yes Historical Provider, MD   BP 112/59 mmHg  Pulse 50  Temp(Src) 97.3 F (36.3 C) (Oral)  Resp 15  Ht 5' (1.524 m)  Wt 58.968 kg  BMI 25.39 kg/m2  SpO2 100% Physical Exam  Constitutional: She is oriented to person, place, and time. She appears well-developed and well-nourished. No distress.  HENT:  Head: Normocephalic and atraumatic.  Mouth/Throat: Oropharynx is clear and moist.  Eyes: Pupils are equal, round, and reactive to light.  Neck: Normal range of motion. Neck supple.  Cardiovascular: Normal rate, regular rhythm and normal heart sounds.  Exam reveals no gallop and no friction rub.   No murmur heard. Pulmonary/Chest: Effort normal and breath sounds normal. No respiratory distress. She has no wheezes.  Abdominal: Soft. Bowel sounds are normal. She exhibits no distension. There is no tenderness.  Neurological: She is alert and oriented to person, place, and time. She exhibits normal muscle tone. Coordination normal.  Skin: Skin is warm and  dry. No rash noted. No erythema.  Psychiatric: She has a normal mood and affect. Her behavior is normal.  Nursing note and vitals reviewed.   ED Course  Procedures (including critical care time) Labs Review Labs Reviewed  BASIC METABOLIC PANEL - Abnormal; Notable for the following:    Glucose, Bld 125 (*)    Creatinine, Ser 1.09 (*)    GFR calc non Af Amer 55 (*)    All other components within normal limits  CBC - Abnormal; Notable for the following:    WBC 3.6 (*)    All other components within normal limits  URINALYSIS, ROUTINE W REFLEX MICROSCOPIC (NOT AT [facility]) - Abnormal; Notable for the following:    Color, Urine AMBER (*)    APPearance HAZY (*)    Bilirubin Urine MODERATE (*)    Ketones, ur 15 (*)    Leukocytes, UA TRACE (*)    All other components within normal limits  URINE MICROSCOPIC-ADD ON - Abnormal; Notable for the following:    Squamous Epithelial / LPF 0-5 (*)    Bacteria, UA MANY (*)    Crystals CA OXALATE CRYSTALS (*)    All other components within normal limits    Imaging Review Ct Head Wo Contrast  06/06/2015  CLINICAL DATA:  Initial encounter for Pt had 2 near-syncopal episodes this morning. Hit her head above her right eye. Pt has bruising above Right eyebrow. Pt did not fully lose consciousness Hx: Stroke, Down syndrome, HTN EXAM: CT HEAD WITHOUT CONTRAST TECHNIQUE: Contiguous axial images were obtained from the base of the skull through the vertex without intravenous contrast. COMPARISON:  03/27/2015 FINDINGS: Sinuses/Soft tissues: Mild motion degradation. Right supraorbital soft tissue swelling is mild to moderate. Clear paranasal sinuses and mastoid air cells. Intracranial: Redemonstration of encephalomalacia in the left frontal lobe. Age advanced cerebral atrophy. Mild low density in the periventricular white matter likely related to small vessel disease. Reticular prominence is similar, felt to be secondary to atrophy. No mass lesion, hemorrhage,  hydrocephalus, acute infarct, intra-axial, or extra-axial fluid collection. IMPRESSION: 1. Right supraorbital soft tissue swelling, without acute intracranial abnormality. 2. Mild motion degradation. 3.  Cerebral atrophy and small vessel ischemic change. 4. Encephalomalacia in the left frontal lobe is chronic. Electronically Signed   By: Abigail Miyamoto M.D.   On: 06/06/2015 12:12   I have personally reviewed and evaluated these images and lab results as part of my medical decision-making.  Patient be admitted to the hospital for further evaluation and care.  The patient's blood pressures have been low.  I feel this is a volume issue rather than anything else  Dalia Heading, PA-C 06/10/15 [place], MD 06/10/15 [phone]

## 2015-06-06 NOTE — ED Notes (Signed)
Meal tray ordered for pt  

## 2015-06-06 NOTE — ED Notes (Signed)
Pt brought in EMS for two near-syncopal episodes this morning.  Caregiver reports that pt was sitting in chair and leaned out of it causing a hematoma to her right eye.  Per caregiver, pt did not fully loose consciousness during either episode.  Pt has a hx of vasovagal syncope.

## 2015-06-06 NOTE — H&P (Signed)
Date: 06/06/2015               Patient Name:  Amanda Castillo MRN: [id number]  DOB: 04/01/1956 Age / Sex: 59 y.o., female   PCP: No Pcp Per Patient         Medical Service: Internal Medicine Teaching Service         Attending Physician: Dr. Annia Belt, MD    First Contact: Dr. Lovena Le Pager: [phone]  Second Contact: Dr. Marvel Plan Pager: [phone]       After Hours (After 5p/  First Contact Pager: [phone]  weekends / holidays): Second Contact Pager: [phone]   Chief Complaint: syncope  History of Present Illness: [name] is a 59 yo female with Down's syndrome, previous CVA, hypothyroidism, HTN, HLD, OSA, vasovagal syncope, and sinus bradycardia, presenting after syncopal episode.  History is provided by patient's caregiver, who reports syncopal episode promptly noticed by caregiver who lowered patient to floor without trauma.  Patient lost consciousness, described as "snoring." No jerking movements, tongue biting, or loss of bowel/bladder. EMS was called and evaluated the patient.  She was not transported to the ED.  Shortly thereafter, she was ambulating and fell without LOC, hitting her head on the door frame. Caregiver reports poor PO intake by the patient, who only eats/drinks small amounts of food/liquid at any given time.  This has been chronic for years and she thinks is likely the cause of patient's 100 lb weight loss over the last 4-5 years.  Caregiver believes patient had a colonoscopy, but does not know when it occurred.  Caregiver denies patient complain of fever, chills, CP, SOB, palpitations, N/V, C/D, hematochezia, melena, hematuria, orthopnea, leg swelling, or night sweats recently.  Patient complained of dysuria for the first time in the ED.    She has a history of vasovagal syncope, for which she is followed by Dr. Marlou Porch (Cardiology).  Patient did not tolerate Holter monitor, only wearing for one day, but Dr. Marlou Porch' note states it was reassuring for the  amount of time it was worn.  She has documented sinus bradycardia without pauses.  Echo in May 2016 shows possibly bicuspid AV and mildly thickened leaflets with normal systolic and diastolic function.  High fluid intake and high salt diet were recommended.  She has previously been evaluated by Neurology with full workup being unremarkable.  Multiple EEGs were negative for epilepsy.  MRI demonstrated old stroke (2003) without new changes.    In the ED, CT head demonstrated soft tissue edema without intracranial abnormality. The patient was given 1L NS and remained orthostatic (129/59 lying vs 98/64 standing).    The patient has a legal guardian, Huyen Jungers (sister).   Meds: No current facility-administered medications for this encounter.   Current Outpatient Prescriptions  Medication Sig Dispense Refill  . aspirin 81 MG tablet Take 81 mg by mouth daily.    . cholecalciferol (VITAMIN D) 1000 UNITS tablet Take 1,000 Units by mouth daily.    Marland Kitchen docusate sodium (COLACE) 100 MG capsule Take 1 capsule (100 mg total) by mouth daily as needed for mild constipation. 10 capsule   . levothyroxine (SYNTHROID, LEVOTHROID) 75 MCG tablet Take 75 mcg by mouth daily before breakfast.    . loratadine (CLARITIN) 10 MG tablet Take 10 mg by mouth daily.    Marland Kitchen Propylene Glycol (SYSTANE BALANCE OP) Apply 1 drop to eye daily. Place 1 drop in each eye, every day    . simvastatin (ZOCOR) 40 MG tablet  Take 40 mg by mouth at bedtime. At bedtime    . sodium chloride (OCEAN) 0.65 % SOLN nasal spray Place 1 spray into both nostrils as needed for congestion.    . terbinafine (LAMISIL) 1 % cream Apply 1 application topically 2 (two) times daily.      Allergies: Allergies as of 06/06/2015  . (No Known Allergies)   Past Medical History  Diagnosis Date  . Hyperlipidemia   . Allergy   . Hypothyroidism   . Depression   . Down's syndrome   . Stroke ([place])     in 2006  . PVD (peripheral vascular disease) ([place])   .  Moderate intellectual disability   . Hallucinations   . Sleep apnea   . Hypertension    Past Surgical History  Procedure Laterality Date  . Orif lt foot    . Tonsillectomy    . Colonoscopy  06/12/2012   Family History  Problem Relation Age of Onset  . Colon cancer Paternal Uncle   . Colon cancer Maternal Grandfather    Social History   Social History  . Marital Status: Single    Spouse Name: N/A  . Number of Children: N/A  . Years of Education: N/A   Occupational History  . Not on file.   Social History Main Topics  . Smoking status: Never Smoker   . Smokeless tobacco: Never Used  . Alcohol Use: No  . Drug Use: No  . Sexual Activity: Not on file   Other Topics Concern  . Not on file   Social History Narrative    Review of Systems: Pertinent items noted in HPI and remainder of comprehensive ROS otherwise negative.  Physical Exam: Blood pressure 107/54, pulse 57, temperature 97.3 F (36.3 C), temperature source Oral, resp. rate 22, height 5' (1.524 m), weight 130 lb (58.968 kg), SpO2 100 %. Physical Exam  Constitutional: She is oriented to person, place, and time and well-developed, well-nourished, and in no distress. No distress.  HENT:  Head: Normocephalic.  Soft tissue swelling with violaceous bruising to right eyelid and lateral brow.  Eyes: EOM are normal. Pupils are equal, round, and reactive to light. No scleral icterus.  Neck: No JVD present. No tracheal deviation present.  Cardiovascular: Intact distal pulses.   Borderline bradycardia. Regular rhythm. Grade III/VI systolic crescendo-decrescendo murmur heard best at LUSB.  Pulmonary/Chest: Effort normal and breath sounds normal. No respiratory distress. She has no wheezes.  No crackles.  Abdominal: Soft. She exhibits no distension. There is no tenderness. There is no rebound and no guarding.  Genitourinary:  No CVA tenderness.  Musculoskeletal: Normal range of motion. She exhibits no edema.    Neurological: She is alert and oriented to person, place, and time.  Skin: Skin is warm and dry. No rash noted. She is not diaphoretic.     Lab results: Basic Metabolic Panel:  Recent Labs  06/06/15 0955  NA 142  K 3.7  CL 106  CO2 27  GLUCOSE 125*  BUN 15  CREATININE 1.09*  CALCIUM 9.2   Liver Function Tests: No results for input(s): AST, ALT, ALKPHOS, BILITOT, PROT, ALBUMIN in the last 72 hours. No results for input(s): LIPASE, AMYLASE in the last 72 hours. No results for input(s): AMMONIA in the last 72 hours. CBC:  Recent Labs  06/06/15 0955  WBC 3.6*  HGB 14.1  HCT 42.7  MCV 94.5  PLT 194   Cardiac Enzymes: No results for input(s): CKTOTAL, CKMB, CKMBINDEX, TROPONINI in the  last 72 hours. BNP: No results for input(s): PROBNP in the last 72 hours. D-Dimer: No results for input(s): DDIMER in the last 72 hours. CBG: No results for input(s): GLUCAP in the last 72 hours. Hemoglobin A1C: No results for input(s): HGBA1C in the last 72 hours. Fasting Lipid Panel: No results for input(s): CHOL, HDL, LDLCALC, TRIG, CHOLHDL, LDLDIRECT in the last 72 hours. Thyroid Function Tests: No results for input(s): TSH, T4TOTAL, FREET4, T3FREE, THYROIDAB in the last 72 hours. Anemia Panel: No results for input(s): VITAMINB12, FOLATE, FERRITIN, TIBC, IRON, RETICCTPCT in the last 72 hours. Coagulation: No results for input(s): LABPROT, INR in the last 72 hours. Urine Drug Screen: Drugs of Abuse  No results found for: LABOPIA, COCAINSCRNUR, LABBENZ, AMPHETMU, THCU, LABBARB  Alcohol Level: No results for input(s): ETH in the last 72 hours. Urinalysis:  Recent Labs  06/06/15 1140  COLORURINE AMBER*  LABSPEC 1.030  PHURINE 5.0  GLUCOSEU NEGATIVE  HGBUR NEGATIVE  BILIRUBINUR MODERATE*  KETONESUR 15*  PROTEINUR NEGATIVE  NITRITE NEGATIVE  LEUKOCYTESUR TRACE*   Misc. Labs:   Imaging results:  Ct Head Wo Contrast  06/06/2015  CLINICAL DATA:  Initial encounter  for Pt had 2 near-syncopal episodes this morning. Hit her head above her right eye. Pt has bruising above Right eyebrow. Pt did not fully lose consciousness Hx: Stroke, Down syndrome, HTN EXAM: CT HEAD WITHOUT CONTRAST TECHNIQUE: Contiguous axial images were obtained from the base of the skull through the vertex without intravenous contrast. COMPARISON:  03/27/2015 FINDINGS: Sinuses/Soft tissues: Mild motion degradation. Right supraorbital soft tissue swelling is mild to moderate. Clear paranasal sinuses and mastoid air cells. Intracranial: Redemonstration of encephalomalacia in the left frontal lobe. Age advanced cerebral atrophy. Mild low density in the periventricular white matter likely related to small vessel disease. Reticular prominence is similar, felt to be secondary to atrophy. No mass lesion, hemorrhage, hydrocephalus, acute infarct, intra-axial, or extra-axial fluid collection. IMPRESSION: 1. Right supraorbital soft tissue swelling, without acute intracranial abnormality. 2. Mild motion degradation. 3.  Cerebral atrophy and small vessel ischemic change. 4. Encephalomalacia in the left frontal lobe is chronic. Electronically Signed   By: Abigail Miyamoto M.D.   On: 06/06/2015 12:12    Other results: EKG: unchanged from previous tracings, sinus bradycardia.  Assessment & Plan by Problem: Principal Problem:   Syncope due to orthostatic hypotension Active Problems:   Down syndrome   Hyperlipidemia   Hypothyroidism   Sleep apnea   Dementia associated with other underlying disease with behavioral disturbance   Dehydration  Ms. Bolinsky is a 59 yo female with Down's syndrome, previous CVA, hypothyroidism, HTN, HLD, OSA, vasovagal syncope, and sinus bradycardia, presenting after syncopal episode.  Orthostatic Hypotension and Vasovagal Syncope: Patient presents with syncope after standing and has positive orthostatic vital signs.  She is followed by Cardiology and Neurology, who have diagnosed  patient with vasovagal syncope.  Other cardiac and neurologic workup has been negative, effectively ruling out seizure and structural heart disease as causes. However, development of these conditions should continue to be monitored. - NS @ 125 ml/hr - [place] [ ]  ECG in AM [ ]  Orthostatics in AM [ ]  TSH  UTI: U/A with many bacteria and patient complaint of dysuria.  Likely to be uncomplicated UTI.  No complaints of fever, chills, or CVA tenderness.  Therefore, UTI sepsis unlikely to be cause of patient's hypotension.   - Fosfomycin  Weight Loss: Reported 100 lb weight loss over 4-5 years 2/2 poor PO intake.  Neurology believes patient may be exhibiting signs of early dementia, which likely accounts for poor eating habits and weight loss.  There is concern for underlying malignancy, which Down's Syndrome patient's are predisposed to.  Reportedly had colonoscopy, which is important given her family history of colon cancer.  No complaints of night sweats.  However, her WBC is low, decreasing probability of leukemia.  Will need to promote PO intake and acquire colonoscopy records. - Nutrition consult [ ]  Colonoscopy records  Hypothyroidism:  - Synthroid 75 mcg [ ]  TSH  H/o CVA, HTN: - ASA - Simvastatin 40mg   Constipation: Miralax and colace  FEN/GI: - HH  DVT Ppx: Lovenox  Dispo: Disposition is deferred at this time, awaiting improvement of current medical problems. Anticipated discharge in approximately 1-2 day(s).   The patient does not have a current PCP (No Pcp Per Patient) and does need an [facility] hospital follow-up appointment after discharge.  The patient does not have transportation limitations that hinder transportation to clinic appointments.  Signed: Iline Oven, MD, PhD 06/06/2015, 4:14 PM

## 2015-06-07 ENCOUNTER — Encounter (HOSPITAL_COMMUNITY): Payer: Self-pay | Admitting: General Practice

## 2015-06-07 DIAGNOSIS — R001 Bradycardia, unspecified: Secondary | ICD-10-CM

## 2015-06-07 DIAGNOSIS — F0281 Dementia in other diseases classified elsewhere with behavioral disturbance: Secondary | ICD-10-CM | POA: Diagnosis not present

## 2015-06-07 DIAGNOSIS — I951 Orthostatic hypotension: Secondary | ICD-10-CM | POA: Diagnosis not present

## 2015-06-07 DIAGNOSIS — E039 Hypothyroidism, unspecified: Secondary | ICD-10-CM

## 2015-06-07 DIAGNOSIS — Q909 Down syndrome, unspecified: Secondary | ICD-10-CM

## 2015-06-07 DIAGNOSIS — R55 Syncope and collapse: Secondary | ICD-10-CM | POA: Diagnosis not present

## 2015-06-07 DIAGNOSIS — N390 Urinary tract infection, site not specified: Secondary | ICD-10-CM | POA: Diagnosis not present

## 2015-06-07 DIAGNOSIS — E785 Hyperlipidemia, unspecified: Secondary | ICD-10-CM | POA: Diagnosis not present

## 2015-06-07 LAB — BASIC METABOLIC PANEL
ANION GAP: 8 (ref 5–15)
BUN: 15 mg/dL (ref 6–20)
CALCIUM: 8.5 mg/dL — AB (ref 8.9–10.3)
CO2: 23 mmol/L (ref 22–32)
CREATININE: 0.92 mg/dL (ref 0.44–1.00)
Chloride: 114 mmol/L — ABNORMAL HIGH (ref 101–111)
Glucose, Bld: 77 mg/dL (ref 65–99)
Potassium: 3.6 mmol/L (ref 3.5–5.1)
SODIUM: 145 mmol/L (ref 135–145)

## 2015-06-07 LAB — CBC
HEMATOCRIT: 38.4 % (ref 36.0–46.0)
Hemoglobin: 12.4 g/dL (ref 12.0–15.0)
MCH: 30.6 pg (ref 26.0–34.0)
MCHC: 32.3 g/dL (ref 30.0–36.0)
MCV: 94.8 fL (ref 78.0–100.0)
PLATELETS: 195 10*3/uL (ref 150–400)
RBC: 4.05 MIL/uL (ref 3.87–5.11)
RDW: 14.4 % (ref 11.5–15.5)
WBC: 4.4 10*3/uL (ref 4.0–10.5)

## 2015-06-07 LAB — URINE CULTURE

## 2015-06-07 MED ORDER — ENSURE ENLIVE PO LIQD: 237.0000 mL | Freq: Two times a day (BID) | ORAL | Status: DC

## 2015-06-07 MED ORDER — ENSURE ENLIVE PO LIQD
237.0000 mL | Freq: Two times a day (BID) | ORAL | Status: DC
  Administered 2015-06-07: 237 mL via ORAL

## 2015-06-07 MED ORDER — FLUDROCORTISONE ACETATE 0.1 MG PO TABS
0.1000 mg | ORAL_TABLET | Freq: Every day | ORAL | Status: DC
  Administered 2015-06-07: 0.1 mg via ORAL
  Filled 2015-06-07 (×2): qty 1

## 2015-06-07 MED ORDER — FLUDROCORTISONE ACETATE 0.1 MG PO TABS: 0.1000 mg | ORAL_TABLET | Freq: Every day | ORAL | Status: DC

## 2015-06-07 MED ORDER — MIDODRINE HCL 5 MG PO TABS: 5.0000 mg | ORAL_TABLET | Freq: Three times a day (TID) | ORAL | Status: DC

## 2015-06-07 MED ORDER — CHOLECALCIFEROL 25 MCG (1000 UT) PO TABS: 1000.0000 [IU] | ORAL_TABLET | Freq: Every day | ORAL | Status: DC

## 2015-06-07 MED ORDER — DOCUSATE SODIUM 100 MG PO CAPS: 100.0000 mg | ORAL_CAPSULE | Freq: Every day | ORAL | Status: DC

## 2015-06-07 MED ORDER — FLUDROCORTISONE ACETATE 0.1 MG PO TABS
0.1000 mg | ORAL_TABLET | Freq: Every day | ORAL | Status: DC
  Filled 2015-06-07: qty 1

## 2015-06-07 MED ORDER — LEVOTHYROXINE SODIUM 125 MCG PO TABS: 62.5000 ug | ORAL_TABLET | Freq: Every day | ORAL | Status: DC

## 2015-06-07 MED ORDER — LEVOTHYROXINE SODIUM 50 MCG PO TABS: 62.5000 ug | ORAL_TABLET | Freq: Every day | ORAL | Status: DC

## 2015-06-07 NOTE — Discharge Instructions (Signed)
Miss Walstad,  It was a pleasure to meet you and talk to your sister, Thayer Headings.  You came into the hospital after passing out. It seems your blood pressure is low, and drops even lower when you stand up. We started you on a medicine called fludrocortisone 0.1 mg daily. I hope this will increase your blood pressure so this doesn't happen again.  You also had some pain when you urinate; when we checked your urine, there were bacteria in there. We gave you a one-time dose of an antibiotic called fosfomycin. If this doesn't get better in the next week or so, please schedule an appointment in our clinic so we can re-check your urine and prescribe you another antibiotic if necessary.  You've also been losing weight recently. We checked your thyroid levels, and it seems the dose of Synthroid may have been a little high. Sometimes this can contribute to weight loss, so I have decreased this to 62.5 mg daily. We'll recheck another level in 6 weeks.  We'll see you on January 10 at our internal medicine clinic with Dr. Randell Patient, Dr. Melburn Hake

## 2015-06-07 NOTE — Progress Notes (Signed)
Subjective: Amanda Castillo still feels dizzy when standing this morning and she still has some dysuria. She doesn't have any other complaints and is eager to go back to her facility today.  Objective: Vital signs in last 24 hours: Filed Vitals:   06/06/15 1530 06/06/15 1703 06/06/15 2118 06/07/15 0445  BP: 107/54 100/55 120/63 125/52  Pulse: 57 55 75 53  Temp:  98.8 F (37.1 C) 98.8 F (37.1 C) 98.5 F (36.9 C)  TempSrc:  Oral Oral   Resp: 22 15 16 16   Height:      Weight:      SpO2: 100% 100% 100% 100%   Weight change:   Intake/Output Summary (Last 24 hours) at 06/07/15 0704 Last data filed at 06/06/15 2300  Gross per 24 hour  Intake      3 ml  Output    500 ml  Net   -497 ml   General: resting in bed comfortably HEENT: ecchymosis on right periorbital area, no scleral icterus, extra-ocular muscles intact, oropharynx without lesions Cardiac: bradycardic but regular rhythm, no rubs, murmurs or gallops Pulm: breathing well, clear to auscultation bilaterally Abd: bowel sounds normal, soft, nondistended, non-tender Ext: warm and well perfused, without pedal edema Lymph: no cervical or supraclavicular lymphadenopathy Skin: ecchymosis on right periorbital area Neuro: cranial nerves II-XII grossly intact, moving all extremities well  Lab Results: Basic Metabolic Panel:  Recent Labs Lab 06/06/15 0955  NA 142  K 3.7  CL 106  CO2 27  GLUCOSE 125*  BUN 15  CREATININE 1.09*  CALCIUM 9.2   CBC:  Recent Labs Lab 06/06/15 0955  WBC 3.6*  HGB 14.1  HCT 42.7  MCV 94.5  PLT 194   Thyroid Function Tests:  Recent Labs Lab 06/06/15 1835  TSH 0.072*   Urinalysis:  Recent Labs Lab 06/06/15 1140  COLORURINE AMBER*  LABSPEC 1.030  PHURINE 5.0  GLUCOSEU NEGATIVE  HGBUR NEGATIVE  BILIRUBINUR MODERATE*  KETONESUR 15*  PROTEINUR NEGATIVE  NITRITE NEGATIVE  LEUKOCYTESUR TRACE*   Micro Results: No results found for this or any previous visit (from the past  240 hour(s)). Studies/Results: Ct Head Wo Contrast  06/06/2015  CLINICAL DATA:  Initial encounter for Pt had 2 near-syncopal episodes this morning. Hit her head above her right eye. Pt has bruising above Right eyebrow. Pt did not fully lose consciousness Hx: Stroke, Down syndrome, HTN EXAM: CT HEAD WITHOUT CONTRAST TECHNIQUE: Contiguous axial images were obtained from the base of the skull through the vertex without intravenous contrast. COMPARISON:  03/27/2015 FINDINGS: Sinuses/Soft tissues: Mild motion degradation. Right supraorbital soft tissue swelling is mild to moderate. Clear paranasal sinuses and mastoid air cells. Intracranial: Redemonstration of encephalomalacia in the left frontal lobe. Age advanced cerebral atrophy. Mild low density in the periventricular white matter likely related to small vessel disease. Reticular prominence is similar, felt to be secondary to atrophy. No mass lesion, hemorrhage, hydrocephalus, acute infarct, intra-axial, or extra-axial fluid collection. IMPRESSION: 1. Right supraorbital soft tissue swelling, without acute intracranial abnormality. 2. Mild motion degradation. 3.  Cerebral atrophy and small vessel ischemic change. 4. Encephalomalacia in the left frontal lobe is chronic. Electronically Signed   By: Abigail Miyamoto M.D.   On: 06/06/2015 12:12   Medications: I have reviewed the patient's current medications. Scheduled Meds: . aspirin EC  81 mg Oral Daily  . cholecalciferol  1,000 Units Oral Daily  . docusate sodium  100 mg Oral Daily  . enoxaparin (LOVENOX) injection  40 mg Subcutaneous  Q24H  . feeding supplement (ENSURE ENLIVE)  237 mL Oral BID BM  . fludrocortisone  0.1 mg Oral Q breakfast  . [START ON 06/08/2015] levothyroxine  62.5 mcg Oral QAC breakfast  . loratadine  10 mg Oral Daily  . simvastatin  40 mg Oral QHS  . sodium chloride  3 mL Intravenous Q12H   Continuous Infusions:  PRN Meds:.ondansetron **OR** ondansetron (ZOFRAN) IV, sodium chloride     Assessment/Plan: Orthostatic Hypotension and Vasovagal Syncope: She continues to be symptomatically orthostatic today despite getting fluids overnight. We'll start her on fludrocortisone 0.1mg  daily to help prevent any more falls. She was borderline hypernatremic today, but I think this was from her saline she got overnight. We'll ask her visiting doctor, Dr. Fredderick Phenix, to re-check a BMP in a couple weeks. -Started fludrocortisone 0.1mg  daily -Encouraged to drink more fluids  Urinary tract infection: She continues to complain of some mild dysuria today. We'll treat her with fosfomycin once today before she goes home. -Fosfomycin 3g today  Weight Loss: Reported 100 lb weight loss over 4-5 years secondary to poor PO intake. Neurology believes patient may be exhibiting signs of early dementia, which likely accounts for poor eating habits and weight loss.Iatrogenic hyperthyroidism is another consideration given her low TSH. -Nutrition consulted  -Dropped dose of T4 per below  Hypothyroidism: Her TSH today was low at 0.072, and she has significant weight loss per above. Therefore, I'll drop her dose from 44mcg to 77mcg daily and recommend re-checking another TSH in 6 weeks. -Dropped synthroid dose from 75 to 63 mcg daily  Dispo: Back to her facility today.  The patient does not have a current PCP (No Pcp Per Patient) and does not need an [facility] hospital follow-up appointment after discharge.  The patient does have transportation limitations that hinder transportation to clinic appointments.  .Services Needed at time of discharge: Y = Yes, Blank = No PT:   OT:   RN:   Equipment:   Other:     LOS: 1 day   Loleta Chance, MD 06/07/2015, 7:04 AM

## 2015-06-07 NOTE — Discharge Summary (Signed)
Name: Amanda Castillo MRN: [id number] DOB: 09/12/1955 59 y.o. PCP: No Pcp Per Patient  Date of Admission: 06/06/2015  9:30 AM Date of Discharge: 06/07/2015 Attending Physician: Annia Belt, MD  Discharge Diagnosis: 1. Syncope due to orthostatic hypotension 2. Hypothyroidism 3. Significant weight loss   Discharge Medications:   Medication List    ASK your doctor about these medications        aspirin 81 MG tablet  Take 81 mg by mouth daily.     cholecalciferol 1000 units tablet  Commonly known as:  VITAMIN D  Take 1,000 Units by mouth daily.     docusate sodium 100 MG capsule  Commonly known as:  COLACE  Take 1 capsule (100 mg total) by mouth daily as needed for mild constipation.     levothyroxine 75 MCG tablet  Commonly known as:  SYNTHROID, LEVOTHROID  Take 75 mcg by mouth daily before breakfast.     loratadine 10 MG tablet  Commonly known as:  CLARITIN  Take 10 mg by mouth daily.     simvastatin 40 MG tablet  Commonly known as:  ZOCOR  Take 40 mg by mouth at bedtime. At bedtime     sodium chloride 0.65 % Soln nasal spray  Commonly known as:  OCEAN  Place 1 spray into both nostrils as needed for congestion.     SYSTANE BALANCE OP  Apply 1 drop to eye daily. Place 1 drop in each eye, every day     terbinafine 1 % cream  Commonly known as:  LAMISIL  Apply 1 application topically 2 (two) times daily.        Disposition and follow-up:   Amanda Castillo was discharged from [facility] in Stable condition.  At the hospital follow up visit please address:  1.  Re-check a BMP in 2 weeks (near middle of January) to ensure she is not hypernatremic nor hypokalemic since starting fludrocortisone for her orthostatic hypotension  2. Re-check a TSH in 6 weeks (near the beginning of February) since we dropped her Synthroid from 30 to 35.44mcg daily  3. Ensure her urinary tract infection has resolved since getting  fosfomycin  Follow-up Appointments:     Follow-up Information    Follow up with Amanda Earthly, MD On 06/20/2015.   Specialty:  Internal Medicine   Why:  8:15 am   Contact information:   [place] [id number] [phone]       Discharge Instructions:   Procedures Performed:  Ct Head Wo Contrast  06/06/2015  CLINICAL DATA:  Initial encounter for Pt had 2 near-syncopal episodes this morning. Hit her head above her right eye. Pt has bruising above Right eyebrow. Pt did not fully lose consciousness Hx: Stroke, Down syndrome, HTN EXAM: CT HEAD WITHOUT CONTRAST TECHNIQUE: Contiguous axial images were obtained from the base of the skull through the vertex without intravenous contrast. COMPARISON:  03/27/2015 FINDINGS: Sinuses/Soft tissues: Mild motion degradation. Right supraorbital soft tissue swelling is mild to moderate. Clear paranasal sinuses and mastoid air cells. Intracranial: Redemonstration of encephalomalacia in the left frontal lobe. Age advanced cerebral atrophy. Mild low density in the periventricular white matter likely related to small vessel disease. Reticular prominence is similar, felt to be secondary to atrophy. No mass lesion, hemorrhage, hydrocephalus, acute infarct, intra-axial, or extra-axial fluid collection. IMPRESSION: 1. Right supraorbital soft tissue swelling, without acute intracranial abnormality. 2. Mild motion degradation. 3.  Cerebral atrophy and small vessel ischemic change.  4. Encephalomalacia in the left frontal lobe is chronic. Electronically Signed   By: Amanda Castillo M.D.   On: 06/06/2015 12:12    Admission HPI: [name] is a 59 yo female with Down's syndrome, previous CVA, hypothyroidism, HTN, HLD, OSA, vasovagal syncope, and sinus bradycardia, presenting after syncopal episode. History is provided by patient's caregiver, who reports syncopal episode promptly noticed by caregiver who lowered patient to floor without trauma. Patient lost  consciousness, described as "snoring." No jerking movements, tongue biting, or loss of bowel/bladder. EMS was called and evaluated the patient. She was not transported to the ED. Shortly thereafter, she was ambulating and fell without LOC, hitting her head on the door frame. Caregiver reports poor PO intake by the patient, who only eats/drinks small amounts of food/liquid at any given time. This has been chronic for years and she thinks is likely the cause of patient's 100 lb weight loss over the last 4-5 years. Caregiver believes patient had a colonoscopy, but does not know when it occurred. Caregiver denies patient complain of fever, chills, CP, SOB, palpitations, N/V, C/D, hematochezia, melena, hematuria, orthopnea, leg swelling, or night sweats recently. Patient complained of dysuria for the first time in the ED.   She has a history of vasovagal syncope, for which she is followed by Dr. Marlou Castillo (Cardiology). Patient did not tolerate Holter monitor, only wearing for one day, but Dr. Marlou Castillo' note states it was reassuring for the amount of time it was worn. She has documented sinus bradycardia without pauses. Echo in May 2016 shows possibly bicuspid AV and mildly thickened leaflets with normal systolic and diastolic function. High fluid intake and high salt diet were recommended. She has previously been evaluated by Neurology with full workup being unremarkable. Multiple EEGs were negative for epilepsy. MRI demonstrated old stroke (2003) without new changes.   In the ED, CT head demonstrated soft tissue edema without intracranial abnormality. The patient was given 1L NS and remained orthostatic (129/59 lying vs 98/64 standing).   The patient has a legal guardian, Amanda Castillo (sister).   Hospital Course by problem list:  Syncope caused by orthostatic hypotension: She presented with syncope after standing and had positive orthostatic vital signs that did not respond to fluids  overnight.Because she is at such a high fall risk, we decided to start fludrocortisone 0.1 mg daily on the day of discharge. Her sodium was borderline high at 145 at this time, but we believe this is from her getting normal saline overnight. We'll need to recheck another BMP in 2 weeks to ensure her electrolytes are normal since starting fludrocortisone.  Urinary tract infection: On admission, her urinalysis showed many bacteria and she complained of dysuria, but denied complaints of fever, chills, or CVA tenderness.Therefore, we thought urinary sepsis unlikely to be cause of patient's hypotension.We gave her a one-time dose of 3g fosfomycin and will need to ensure her dysuria has resolved in the next week or so.  Weight Loss: She reported 100 lb weight loss over 4-5 years that has been chalked up to poor oral intake; neurology believes patient may be exhibiting signs of early dementia, which accounts for poor eating habits.We consult nutrition who recommended boost daily. Her TSH was also abnormally low this admission, so we question whether she may have subclinical iatrogenic hyperthyroidism, so we decreased her Synthroid from 75 mcg to 62.5 mcg daily. There is also concern for underlying malignancy, which Down's Syndrome patient's are predisposed to.Reportedly she had a colonoscopy that was normal and  the last 5 years, which is important given her family history of colon cancer.  Hypothyroidism: Her TSH was abnormally low at 0.072. Per above, I question whether her iatrogenic hyperthyroidism may be contributing to her weight loss, although she does not have any other signs or symptoms of hyperthyroidism. I decreased her dose from 75 g to 62.5 g daily. We'll need to recheck another TSH in 6 weeks.  History of cerebrovascular accident: A CT of her head was normal on admission. We continued her daily aspirin and simvastatin.  Constipation: We continued her miralax and colace.  Discharge Vitals:    BP 126/55 mmHg  Pulse 50  Temp(Src) 98.5 F (36.9 C) (Oral)  Resp 16  Ht 5' (1.524 m)  Wt 130 lb (58.968 kg)  BMI 25.39 kg/m2  SpO2 100%  Discharge Labs:  Results for orders placed or performed during the hospital encounter of 06/06/15 (from the past 24 hour(s))  Urinalysis, Routine w reflex microscopic     Status: Abnormal   Collection Time: 06/06/15 11:40 AM  Result Value Ref Range   Color, Urine AMBER (A) YELLOW   APPearance HAZY (A) CLEAR   Specific Gravity, Urine 1.030 1.005 - 1.030   pH 5.0 5.0 - 8.0   Glucose, UA NEGATIVE NEGATIVE mg/dL   Hgb urine dipstick NEGATIVE NEGATIVE   Bilirubin Urine MODERATE (A) NEGATIVE   Ketones, ur 15 (A) NEGATIVE mg/dL   Protein, ur NEGATIVE NEGATIVE mg/dL   Nitrite NEGATIVE NEGATIVE   Leukocytes, UA TRACE (A) NEGATIVE  Urine microscopic-add on     Status: Abnormal   Collection Time: 06/06/15 11:40 AM  Result Value Ref Range   Squamous Epithelial / LPF 0-5 (A) NONE SEEN   WBC, UA 0-5 0 - 5 WBC/hpf   RBC / HPF NONE SEEN 0 - 5 RBC/hpf   Bacteria, UA MANY (A) NONE SEEN   Crystals CA OXALATE CRYSTALS (A) NEGATIVE   Urine-Other LESS THAN 10 mL OF URINE SUBMITTED   TSH     Status: Abnormal   Collection Time: 06/06/15  6:35 PM  Result Value Ref Range   TSH 0.072 (L) 0.350 - 4.500 uIU/mL  CBC     Status: None   Collection Time: 06/07/15  6:15 AM  Result Value Ref Range   WBC 4.4 4.0 - 10.5 K/uL   RBC 4.05 3.87 - 5.11 MIL/uL   Hemoglobin 12.4 12.0 - 15.0 g/dL   HCT 38.4 36.0 - 46.0 %   MCV 94.8 78.0 - 100.0 fL   MCH 30.6 26.0 - 34.0 pg   MCHC 32.3 30.0 - 36.0 g/dL   RDW 14.4 11.5 - 15.5 %   Platelets 195 150 - 400 K/uL  Basic metabolic panel     Status: Abnormal   Collection Time: 06/07/15  6:15 AM  Result Value Ref Range   Sodium 145 135 - 145 mmol/L   Potassium 3.6 3.5 - 5.1 mmol/L   Chloride 114 (H) 101 - 111 mmol/L   CO2 23 22 - 32 mmol/L   Glucose, Bld 77 65 - 99 mg/dL   BUN 15 6 - 20 mg/dL   Creatinine, Ser 0.92 0.44 -  1.00 mg/dL   Calcium 8.5 (L) 8.9 - 10.3 mg/dL   GFR calc non Af Amer >60 >60 mL/min   GFR calc Af Amer >60 >60 mL/min   Anion gap 8 5 - 15    Signed: Loleta Chance, MD 06/07/2015, 10:06 AM

## 2015-06-07 NOTE — Progress Notes (Signed)
Nutrition Brief Note  Patient identified on the Malnutrition Screening Tool (MST) Report and consult to address weight loss.   Wt Readings from Last 15 Encounters:  06/06/15 130 lb (58.968 kg)  05/17/15 130 lb (58.968 kg)  05/16/15 126 lb 1.9 oz (57.208 kg)  04/06/15 129 lb (58.514 kg)  02/17/15 130 lb 12.8 oz (59.33 kg)  01/16/15 134 lb (60.782 kg)  11/14/14 134 lb (60.782 kg)  11/09/14 133 lb (60.328 kg)  06/09/14 149 lb (67.586 kg)  04/27/14 149 lb 14.4 oz (67.994 kg)  03/28/14 146 lb 8 oz (66.452 kg)  03/23/14 151 lb 11.2 oz (68.811 kg)  02/22/14 161 lb (73.029 kg)  10/12/13 161 lb (73.029 kg)  07/12/12 216 lb (97.977 kg)   Amanda Castillo is a 59 yo female with Down's syndrome, previous CVA, hypothyroidism, HTN, HLD, OSA, vasovagal syncope, and sinus bradycardia, presenting after syncopal episode.  Pt admitted with syncope.   Spoke with pt's POA, Thayer Headings, at bedside. She reports that pt had a poor appetite and weight loss over one year ago when pt was in another facility. She reveals that pt often refused to eat because pt did not like the food that was provided to her (mainly sandwiches). Pt has since been transferred to another living situation and has been thriving. POA reveals that pt is often a selective eater and loves sweets. Noted pt was snacking on peanuts of chocolate candies at time of visit.   Spoke with pt's caregiver over telephone. She reports pt was eating well PTA and consumed 3 meals per day. Breakfast consists mainly of cereal (she reports if pt receives a sausage biscuit, she will only eat the sausage). Lunch is two sandwiches, and dinner consists of a meat, starch, and vegetable. Pt does require encouragement to eat and will often offer a ice cream sundae as motivation for pt to eat. She also reports that pt will sometimes refuse food and pout if she is not given a food she likes.   Noted pt's weight has been stable over the past 6 months. Nutrition-Focused physical  exam completed. Findings are no fat depletion, mild muscle depletion, and no edema. Noted muscle depletion is likely related to minimal activity level- pt often uses a wheelchair or walker to ambulate.   Discussed ways to ensure pt eats adequately. Discussed offering nutritional supplements after meals if pt does not eat well. Per POA, they would be willing to try this again, however, pt has had poor acceptance of supplements in the past. Discussed high protein substitutes that pt likes that could be used during meals and snacks.   Pt to be discharged back to home environment today. Assisted pt and POA with meal selection for lunch. They expressed appreciation for visit.    Body mass index is 25.39 kg/(m^2). Patient meets criteria for overweight based on current BMI.   Current diet order is regular, patient is consuming approximately 100% of meals at this time. Labs and medications reviewed.   No nutrition interventions warranted at this time. If nutrition issues arise, please consult RD.   Smith Potenza A. Jimmye Norman, RD, LDN, CDE Pager: [phone] After hours Pager: [phone]

## 2015-06-15 ENCOUNTER — Telehealth: Payer: Self-pay | Admitting: *Deleted

## 2015-06-15 NOTE — Telephone Encounter (Signed)
Transition Care Management Follow-up Telephone Call   Date discharged?06/06/2015   How have you been since you were released from the hospital good, no problems   Do you understand why you were in the hospital? yes   Do you understand the discharge instructions? yes   Where were you discharged to? To caretaker's home, dorothy   Items Reviewed:  Medications reviewed: yes  Allergies reviewed: yes  Dietary changes reviewed: yes  Referrals reviewed: yes   Functional Questionnaire:   Activities of Daily Living (ADLs):   She states they are independent in the following: dorothy states she must greatly assist Jesilyn in all activities, she does feed herself but needs to be supervised like a child States they require assistance with the following: all things   Any transportation issues/concerns?: no   Any patient concerns? no   Confirmed importance and date/time of follow-up visits scheduled yes  Provider Appointment booked with dr Randell Patient  Confirmed with patient if condition begins to worsen call PCP or go to the ER.  Patient was given the office number and encouraged to call back with question or concerns.  : yes

## 2015-06-20 ENCOUNTER — Ambulatory Visit: Payer: Medicare Other | Admitting: Pulmonary Disease

## 2015-06-21 ENCOUNTER — Encounter: Payer: Self-pay | Admitting: Pulmonary Disease

## 2015-06-21 ENCOUNTER — Ambulatory Visit (INDEPENDENT_AMBULATORY_CARE_PROVIDER_SITE_OTHER): Payer: Medicare Other | Admitting: Pulmonary Disease

## 2015-06-21 VITALS — BP 72/50 | HR 112 | Temp 97.5°F | Ht 59.0 in | Wt 132.9 lb

## 2015-06-21 DIAGNOSIS — B9689 Other specified bacterial agents as the cause of diseases classified elsewhere: Secondary | ICD-10-CM | POA: Diagnosis not present

## 2015-06-21 DIAGNOSIS — I951 Orthostatic hypotension: Secondary | ICD-10-CM | POA: Diagnosis not present

## 2015-06-21 DIAGNOSIS — N390 Urinary tract infection, site not specified: Secondary | ICD-10-CM | POA: Diagnosis not present

## 2015-06-21 DIAGNOSIS — Z09 Encounter for follow-up examination after completed treatment for conditions other than malignant neoplasm: Secondary | ICD-10-CM | POA: Diagnosis not present

## 2015-06-21 MED ORDER — FLUDROCORTISONE ACETATE 0.1 MG PO TABS: 0.2000 mg | ORAL_TABLET | Freq: Every day | ORAL | Status: DC

## 2015-06-21 NOTE — Progress Notes (Signed)
Medicine attending: Medical history, presenting problems, physical findings, and medications, reviewed with resident physician Dr Jacques Earthly on the day of the patient visit and I concur with her evaluation and management plan which we discussed together.  Patient know to me from recent hospitalization. Down's syndrome. Idiopathic sinus bradycardia. Recurrent orthostatic syncope. Negative Cardiology eval in recent past.  ACTH stimulation normal.We started her on a trial of florinef 0.1 mg daily. Caretakers repot improved performance status and PO intake. She continues to have orthostatic fall in BP today dow to 70 mm standing. We will increase florinef to 0.2 mg. Monitor fluid & electrolyte status closely.

## 2015-06-21 NOTE — Progress Notes (Signed)
Subjective:    Patient ID: Amanda Castillo, female    DOB: 1956/05/11, 60 y.o.   MRN: [id number]  HPI [name] is a 60 year old woman with history of Down's syndrome, hypothyroidism, CVA, PVD, HTN, sinus bradycardia, HLD, OSA, depression presenting for follow up of syncope due to orthostatic hypotension.  She was recently hospitalized from 06/06/2015 to 06/07/2015 for syncope due to orthostatic hypotension. She was started on Florinef 0.1 mg daily. She received fosfomycin for UTI.  The majority of her history and ROS was obtained through her caregiver, Amanda Castillo, due to the patient's dementia and Down's syndrome.  Amanda Castillo reports Amanda Castillo has been doing well since discharge from the hospital. No further episodes of syncope. She has returned to her daily activities including going to a day program and volunteering twice a week at [employer]. Both places she is active and on her feet. Her ecchymosis around her eyes has been improving. She has improved PO intake. She has not complained of dysuria.  Review of Systems Constitutional: no fevers/chills Gastrointestinal: no nausea/vomiting, no abdominal pain, no constipation, no diarrhea  Past Medical History  Diagnosis Date  . Hyperlipidemia   . Allergy   . Hypothyroidism   . Depression   . Down's syndrome   . Stroke ([place])     in 2006  . PVD (peripheral vascular disease) ([place])   . Moderate intellectual disability   . Hallucinations   . Sleep apnea   . Hypertension   . Orthostatic hypotension 06/06/2015   Past Surgical History  Procedure Laterality Date  . Orif lt foot    . Tonsillectomy    . Colonoscopy  06/12/2012   Current Outpatient Prescriptions on File Prior to Visit  Medication Sig Dispense Refill  . aspirin 81 MG tablet Take 81 mg by mouth daily.    . cholecalciferol (VITAMIN D) 1000 UNITS tablet Take 1,000 Units by mouth daily.    . cholecalciferol 1000 units tablet Take 1 tablet (1,000 Units  total) by mouth daily. 90 tablet 3  . docusate sodium (COLACE) 100 MG capsule Take 1 capsule (100 mg total) by mouth daily as needed for mild constipation. 10 capsule   . docusate sodium (COLACE) 100 MG capsule Take 1 capsule (100 mg total) by mouth daily. 10 capsule 0  . feeding supplement, ENSURE ENLIVE, (ENSURE ENLIVE) LIQD Take 237 mLs by mouth 2 (two) times daily between meals. 237 mL 12  . fludrocortisone (FLORINEF) 0.1 MG tablet Take 1 tablet (0.1 mg total) by mouth daily with breakfast. 90 tablet 3  . levothyroxine (SYNTHROID, LEVOTHROID) 125 MCG tablet Take 0.5 tablets (62.5 mcg total) by mouth daily before breakfast. 90 tablet 3  . loratadine (CLARITIN) 10 MG tablet Take 10 mg by mouth daily.    Marland Kitchen Propylene Glycol (SYSTANE BALANCE OP) Apply 1 drop to eye daily. Place 1 drop in each eye, every day    . simvastatin (ZOCOR) 40 MG tablet Take 40 mg by mouth at bedtime. At bedtime    . sodium chloride (OCEAN) 0.65 % SOLN nasal spray Place 1 spray into both nostrils as needed for congestion.    . terbinafine (LAMISIL) 1 % cream Apply 1 application topically 2 (two) times daily.     No current facility-administered medications on file prior to visit.    Today's Vitals   06/21/15 0850  BP: 72/50  Pulse: 112  Temp: 97.5 F (36.4 C)  TempSrc: Oral  Height: 4\' 11"  (1.499 m)  Weight: 132 lb 14.4 oz (60.283 kg)  SpO2: 100%   Objective:   Physical Exam  Constitutional: No distress.  HENT:  Head: Normocephalic and atraumatic.  Eyes: Conjunctivae are normal.  Residual ecchymosis in lower periorbital region bilaterally  Pulmonary/Chest: Effort normal and breath sounds normal. No respiratory distress. She has no wheezes.  Abdominal: Soft. She exhibits no distension. There is no tenderness.  Neurological: She is alert.  Skin: She is not diaphoretic.   Assessment & Plan:  Please refer to problem based charting.

## 2015-06-21 NOTE — Patient Instructions (Signed)
WARNING/CAUTION: Even though it may be rare, some people may have very bad and sometimes deadly side effects when taking a drug. Tell your doctor or get medical help right away if you have any of the following signs or symptoms that may be related to a very bad side effect:  . Signs of an allergic reaction, like rash; hives; itching; red, swollen, blistered, or peeling skin with or without fever; wheezing; tightness in the chest or throat; trouble breathing or talking; unusual hoarseness; or swelling of the mouth, face, lips, tongue, or throat. . Signs of infection like fever, chills, very bad sore throat, ear or sinus pain, cough, more sputum or change in color of sputum, pain with passing urine, mouth sores, or wound that will not heal. . Signs of high blood sugar like confusion, feeling sleepy, more thirst, more hungry, passing urine more often, flushing, fast breathing, or breath that smells like fruit. . Signs of Cushing's disease like weight gain in the upper back or belly, moon face, very bad headache, or slow healing. . Signs of a weak adrenal gland like a very bad upset stomach or throwing up, very bad dizziness or passing out, muscle weakness, feeling very tired, mood changes, not hungry, or weight loss. . Signs of low potassium levels like muscle pain or weakness, muscle cramps, or a heartbeat that does not feel normal. . Signs of a pancreas problem (pancreatitis) like very bad stomach pain, very bad back pain, or very bad upset stomach or throwing up. . Feeling very tired, weak, or touchy; trembling; having a fast heartbeat, confusion, sweating, or dizziness if you missed a dose or recently stopped this drug. Marland Kitchen Shortness of breath, a big weight gain, swelling in the arms or legs. . Skin changes (pimples, stretch marks, slow healing, hair growth). . Chest pain or pressure. . Swelling, warmth, numbness, change of color, or pain in a leg or arm. . Period (menstrual) changes. . Bone or joint  pain. . Change in eyesight. . Change in the way you act. . Low mood (depression). . Seizures. . A burning, numbness, or tingling feeling that is not normal. . Any unexplained bruising or bleeding. . Very bad belly pain. . Black, tarry, or bloody stools. . Throwing up blood or throw up that looks like coffee grounds.

## 2015-06-21 NOTE — Assessment & Plan Note (Addendum)
Assessment: Symptomatically improved. She is still orthostatic with BP supine 115/44, sitting 105/50, and standing 70/48. Compliant and tolerating Florinef.  Plan: -Increase fludrocortisone to 0.2mg  daily -Check BMP today -Follow up in 2 weeks.

## 2015-06-21 NOTE — Assessment & Plan Note (Signed)
Assessment: No further complaints of dysuria. Appears to have resolved. UCx with multiple species.  Plan: -No further treatment needed at this time.

## 2015-06-22 ENCOUNTER — Telehealth: Payer: Self-pay | Admitting: Pulmonary Disease

## 2015-06-22 MED ORDER — FLUDROCORTISONE ACETATE 0.1 MG PO TABS: 0.2000 mg | ORAL_TABLET | Freq: Every day | ORAL | Status: DC

## 2015-06-22 NOTE — Telephone Encounter (Signed)
Patient was prescribed a medication in hospital and was only told to take one pill and yesterday at appt it was increased to 2 pills and needs to get a refill so that she can give that dosage  [facility] [phone]

## 2015-06-22 NOTE — Telephone Encounter (Signed)
Rx changed to 60.

## 2015-06-22 NOTE — Telephone Encounter (Signed)
Fludrocortisone was sent yesterday to pharm but amount dispensed was to be only #90, directions was take 2 daily which would be #60 for 1 month and #180 for 3 months, please review and send a new script for the correct amount or instructions, thanks

## 2015-06-29 ENCOUNTER — Ambulatory Visit: Payer: Medicare Other | Admitting: Podiatry

## 2015-07-05 ENCOUNTER — Encounter: Payer: Self-pay | Admitting: Pulmonary Disease

## 2015-07-05 ENCOUNTER — Ambulatory Visit (INDEPENDENT_AMBULATORY_CARE_PROVIDER_SITE_OTHER): Payer: Medicare Other | Admitting: Pulmonary Disease

## 2015-07-05 VITALS — BP 118/69 | HR 55 | Temp 98.4°F | Resp 18 | Ht 62.0 in | Wt 138.1 lb

## 2015-07-05 DIAGNOSIS — E039 Hypothyroidism, unspecified: Secondary | ICD-10-CM

## 2015-07-05 DIAGNOSIS — I951 Orthostatic hypotension: Secondary | ICD-10-CM

## 2015-07-05 NOTE — Patient Instructions (Addendum)
Your blood pressure has improved. Continue to take the fludrocortisone 2 tablets daily.  Please follow up in 3-4 weeks to get your thyroid levels rechecked.

## 2015-07-05 NOTE — Progress Notes (Signed)
   Subjective:    Patient ID: Amanda Castillo, female    DOB: [birth year], 60 y.o.   MRN: [id number]  HPI [name] is a 60 year old woman with history of Down's syndrome, hypothyroidism, CVA, PVD, HTN, sinus bradycardia, HLD, OSA, depression presenting for follow up of syncope due to orthostatic hypotension.  She is accompanied by one of her caregivers. She is doing well overall. No further episodes of syncope. She has gotten back to her normal activities without any problems.  Review of Systems Constitutional: no fevers/chills Genitourinary: no dysuria, no hematuria  Past Medical History  Diagnosis Date  . Hyperlipidemia   . Allergy   . Hypothyroidism   . Depression   . Down's syndrome   . Stroke ([place])     in 2006  . PVD (peripheral vascular disease) ([place])   . Moderate intellectual disability   . Hallucinations   . Sleep apnea   . Hypertension   . Orthostatic hypotension 06/06/2015    Current Outpatient Prescriptions on File Prior to Visit  Medication Sig Dispense Refill  . aspirin 81 MG tablet Take 81 mg by mouth daily.    . cholecalciferol (VITAMIN D) 1000 UNITS tablet Take 1,000 Units by mouth daily.    . cholecalciferol 1000 units tablet Take 1 tablet (1,000 Units total) by mouth daily. 90 tablet 3  . docusate sodium (COLACE) 100 MG capsule Take 1 capsule (100 mg total) by mouth daily as needed for mild constipation. 10 capsule   . docusate sodium (COLACE) 100 MG capsule Take 1 capsule (100 mg total) by mouth daily. 10 capsule 0  . feeding supplement, ENSURE ENLIVE, (ENSURE ENLIVE) LIQD Take 237 mLs by mouth 2 (two) times daily between meals. 237 mL 12  . fludrocortisone (FLORINEF) 0.1 MG tablet Take 2 tablets (0.2 mg total) by mouth daily with breakfast. 60 tablet 3  . levothyroxine (SYNTHROID, LEVOTHROID) 125 MCG tablet Take 0.5 tablets (62.5 mcg total) by mouth daily before breakfast. 90 tablet 3  . loratadine (CLARITIN) 10 MG tablet Take 10 mg by mouth  daily.    Marland Kitchen Propylene Glycol (SYSTANE BALANCE OP) Apply 1 drop to eye daily. Place 1 drop in each eye, every day    . simvastatin (ZOCOR) 40 MG tablet Take 40 mg by mouth at bedtime. At bedtime    . sodium chloride (OCEAN) 0.65 % SOLN nasal spray Place 1 spray into both nostrils as needed for congestion.    . terbinafine (LAMISIL) 1 % cream Apply 1 application topically 2 (two) times daily.     No current facility-administered medications on file prior to visit.    Today's Vitals   07/05/15 0918  BP: 118/69  Pulse: 55  Temp: 98.4 F (36.9 C)  TempSrc: Oral  Resp: 18  Height: 5\' 2"  (1.575 m)  Weight: 138 lb 1.6 oz (62.642 kg)  SpO2: 100%   Objective:   Physical Exam  Constitutional: She appears well-developed and well-nourished. No distress.  Cardiovascular: Normal rate, regular rhythm and normal heart sounds.   Pulmonary/Chest: Effort normal and breath sounds normal. She has no rales.  Neurological: She is alert. Coordination normal.   Assessment & Plan:  Please refer to problem based charting.

## 2015-07-06 LAB — BMP8+ANION GAP
Anion Gap: 17 mmol/L (ref 10.0–18.0)
BUN/Creatinine Ratio: 15 (ref 9–23)
BUN: 14 mg/dL (ref 6–24)
CALCIUM: 8.6 mg/dL — AB (ref 8.7–10.2)
CO2: 26 mmol/L (ref 18–29)
Chloride: 103 mmol/L (ref 96–106)
Creatinine, Ser: 0.95 mg/dL (ref 0.57–1.00)
GFR, EST AFRICAN AMERICAN: 77 mL/min/{1.73_m2} (ref >59)
GFR, EST NON AFRICAN AMERICAN: 67 mL/min/{1.73_m2} (ref >59)
Glucose: 65 mg/dL (ref 65–99)
Potassium: 3.6 mmol/L (ref 3.5–5.2)
Sodium: 146 mmol/L — ABNORMAL HIGH (ref 134–144)

## 2015-07-06 NOTE — Assessment & Plan Note (Addendum)
Assessment: Improved with BP today 118/69. Na 146 and K 3.6.  Plan: -Continue fludrocortisone 0.2mg  daily -Continue to follow BMP while on fludrocortisone.

## 2015-07-06 NOTE — Assessment & Plan Note (Signed)
Assessment: Her Synthroid was adjusted while she was hospitalized.  Plan: -Follow up in 3 weeks to recheck TSH.

## 2015-07-08 NOTE — Progress Notes (Signed)
Case discussed with Dr. Krall at the time of the visit. We reviewed the resident's history and exam and pertinent patient test results. I agree with the assessment, diagnosis, and plan of care documented in the resident's note. 

## 2015-07-14 ENCOUNTER — Ambulatory Visit (HOSPITAL_COMMUNITY): Payer: Medicare Other | Admitting: Psychiatry

## 2015-07-26 ENCOUNTER — Encounter (HOSPITAL_COMMUNITY): Payer: Self-pay | Admitting: Psychiatry

## 2015-07-26 ENCOUNTER — Ambulatory Visit (INDEPENDENT_AMBULATORY_CARE_PROVIDER_SITE_OTHER): Payer: Medicare Other | Admitting: Psychiatry

## 2015-07-26 VITALS — BP 143/79 | HR 56 | Ht <= 58 in | Wt 141.6 lb

## 2015-07-26 DIAGNOSIS — F0391 Unspecified dementia with behavioral disturbance: Secondary | ICD-10-CM | POA: Diagnosis not present

## 2015-07-26 DIAGNOSIS — F03918 Unspecified dementia, unspecified severity, with other behavioral disturbance: Secondary | ICD-10-CM

## 2015-07-26 NOTE — Progress Notes (Signed)
Psychiatric Initial Adult Assessment   Patient Identification: EVERLENA ROEL MRN:  [id number] Date of Evaluation:  07/26/2015 Referral Source: Dr. Delice Lesch [facility] neurologist Chief Complaint:   I am happy Visit Diagnosis: Down syndrome Diagnosis:  Dementia with behavioral disturbance This patient is a 60 year old white female who lives in a [place] home under the care of Merleen Nicely .today she is seen with her sister Thayer Headings who is also the power of attorney. The patient is being sent because she showing some unusual behaviors. His believe that the most likely etiologies this 60 year old Down syndrome patient is likely showing signs of Alzheimer's dementia. Her symptoms are that of packing up her close and bringing them to the living room as if she is going to visit her sister. She does this every night. The patient also has been noted to act like she spending it actually doesn't spit. Over the last few months the patient's ADLs have decline. She now needs more assistance dressing and showering. She needs to be cued to feed herself. A few months ago she lost a significant amount of weight but that seems to got better using food supplements like ensure and other approaches. The patient also is having trouble with hypotension and recently was put on fludrocortisone which is helped her great deal. Her thyroids were also adjusted. The patient denies daily depression. She's been observed cry about once a week. She does a great deal of things for enjoyment. She still gets on her at coloring watching [employer] on TV and going to the circus. She is sleeping well. She's got fair energy. She is wheelchair-bound. Apparently one point she did have a stroke. The patient does demonstrate times of auditory hallucinations but they are not distressing to her. She does not act on any commands. She doesn't seem to be having visual hallucinations. She certainly is not distressed. The patient is not agitated at all. She has not  made attempts to elope. She likes going to her day program and the reports from the program are that she doing well. She is not incontinent. She has no physical complaints. Lately though she's become more and more uncooperative and her language skills and started to drop. Her sister is very sensitive to this in that she doesn't interact as briskly or is appropriately as she was 6 months ago. Again the patient has not violent not suicidal. She drinks no alcohol and uses no drugs.  Patient Active Problem List   Diagnosis Date Noted  . Sinus bradycardia [R00.1]   . Orthostatic hypotension [I95.1] 06/06/2015  . Dementia associated with other underlying disease with behavioral disturbance [F02.81] 05/18/2015  . Down syndrome [Q90.9] 03/24/2014  . Hyperlipidemia [E78.5] 03/24/2014  . Hypothyroidism [E03.9] 03/24/2014  . Sleep apnea [G47.30] 03/24/2014   History of Present Illness:   Elements:   Associated Signs/Symptoms: Depression Symptoms:  psychomotor agitation, (Hypo) Manic Symptoms:   Anxiety Symptoms:   Psychotic Symptoms:   PTSD Symptoms:   Past Medical History:  Past Medical History  Diagnosis Date  . Hyperlipidemia   . Allergy   . Hypothyroidism   . Depression   . Down's syndrome   . Stroke ([place])     in 2006  . PVD (peripheral vascular disease) ([place])   . Moderate intellectual disability   . Hallucinations   . Sleep apnea   . Hypertension   . Orthostatic hypotension 06/06/2015    Past Surgical History  Procedure Laterality Date  . Orif lt foot    .  Tonsillectomy    . Colonoscopy  06/12/2012   Family History:  Family History  Problem Relation Age of Onset  . Colon cancer Paternal Uncle   . Colon cancer Maternal Grandfather    Social History:   Social History   Social History  . Marital Status: Single    Spouse Name: N/A  . Number of Children: N/A  . Years of Education: N/A   Social History Main Topics  . Smoking status: Never Smoker   . Smokeless tobacco:  Never Used  . Alcohol Use: No  . Drug Use: No  . Sexual Activity: Not Asked   Other Topics Concern  . None   Social History Narrative   Additional Social History:   Musculoskeletal: Strength & Muscle Tone: abnormal Gait & Station: unable to stand Patient leans: Right  Psychiatric Specialty Exam: HPI  ROS  Blood pressure 143/79, pulse 56, height 4\' 10"  (1.473 m), weight 141 lb 9.6 oz (64.229 kg).Body mass index is 29.6 kg/(m^2).  General Appearance: Casual  Eye Contact:  Fair  Speech:  Clear and Coherent  Volume:  Normal  Mood:  Euthymic  Affect:  Negative  Thought Process:  Goal Directed  Orientation:  Full (Time, Place, and Person)  Thought Content:  WDL  Suicidal Thoughts:  No  Homicidal Thoughts:  No  Memory:  NA  Judgement:  Good  Insight:  Fair  Psychomotor Activity:  Decreased  Concentration:  Poor  Recall:  Poor  Fund of Knowledge:Poor  Language: Negative  Akathisia:  No  Handed:  Right  AIMS (if indicated):    Assets:  Communication Skills  ADL's:  Impaired  Cognition: Impaired,  Moderate  Sleep:     Is the patient at risk to self?  No. Has the patient been a risk to self in the past 6 months?  No. Has the patient been a risk to self within the distant past?  No. Is the patient a risk to others?  No. Has the patient been a risk to others in the past 6 months?  No. Has the patient been a risk to others within the distant past?  No.  Allergies:  No Known Allergies Current Medications: Current Outpatient Prescriptions  Medication Sig Dispense Refill  . aspirin 81 MG tablet Take 81 mg by mouth daily.    . cholecalciferol (VITAMIN D) 1000 UNITS tablet Take 1,000 Units by mouth daily.    . cholecalciferol 1000 units tablet Take 1 tablet (1,000 Units total) by mouth daily. 90 tablet 3  . docusate sodium (COLACE) 100 MG capsule Take 1 capsule (100 mg total) by mouth daily as needed for mild constipation. 10 capsule   . docusate sodium (COLACE) 100 MG  capsule Take 1 capsule (100 mg total) by mouth daily. 10 capsule 0  . feeding supplement, ENSURE ENLIVE, (ENSURE ENLIVE) LIQD Take 237 mLs by mouth 2 (two) times daily between meals. 237 mL 12  . fludrocortisone (FLORINEF) 0.1 MG tablet Take 2 tablets (0.2 mg total) by mouth daily with breakfast. 60 tablet 3  . levothyroxine (SYNTHROID, LEVOTHROID) 125 MCG tablet Take 0.5 tablets (62.5 mcg total) by mouth daily before breakfast. 90 tablet 3  . loratadine (CLARITIN) 10 MG tablet Take 10 mg by mouth daily.    Marland Kitchen Propylene Glycol (SYSTANE BALANCE OP) Apply 1 drop to eye daily. Place 1 drop in each eye, every day    . simvastatin (ZOCOR) 40 MG tablet Take 40 mg by mouth at bedtime. At bedtime    .  sodium chloride (OCEAN) 0.65 % SOLN nasal spray Place 1 spray into both nostrils as needed for congestion.    . terbinafine (LAMISIL) 1 % cream Apply 1 application topically 2 (two) times daily.     No current facility-administered medications for this visit.    Previous Psychotropic Medications: No   Substance Abuse History in the last 12 months:  No.  Consequences of Substance Abuse:   Medical Decision Making:   Treatment Plan Summary: At this time there is no indication for any psychotropic medication. I had a long discussion with her neurologist Dr.  Delice Lesch. I also had a long discussion with [name] takes care of her and her sisters Thayer Headings. The patient was also interviewed. In my opinion the patient most likely is developing progressive Alzheimer's dementia. I think she needs some environmental interventions and I think she needs someone who deals with Down syndrome minutes behavioral disturbances were regular basis. I referred the patient to Ronnie Derby at [facility]. I called this provider and she agreed to take her into care. Both the sister and the caregiver agreed to this idea to see Mrs.Ysidro Evert who is a talented provider who cares for this patient population regularly. I think there some environmental  interventions that would be necessary and I think this provider would be aware there is interventions. This patient will not be given a return appointment.     Haskel Schroeder 2/15/20173:21 PM

## 2015-07-27 ENCOUNTER — Encounter: Payer: Self-pay | Admitting: Pulmonary Disease

## 2015-07-27 ENCOUNTER — Ambulatory Visit (INDEPENDENT_AMBULATORY_CARE_PROVIDER_SITE_OTHER): Payer: Medicare Other | Admitting: Pulmonary Disease

## 2015-07-27 VITALS — BP 135/68 | HR 56 | Temp 98.4°F | Ht 62.0 in | Wt 144.3 lb

## 2015-07-27 DIAGNOSIS — Z0000 Encounter for general adult medical examination without abnormal findings: Secondary | ICD-10-CM

## 2015-07-27 DIAGNOSIS — Z79899 Other long term (current) drug therapy: Secondary | ICD-10-CM

## 2015-07-27 DIAGNOSIS — I951 Orthostatic hypotension: Secondary | ICD-10-CM | POA: Diagnosis not present

## 2015-07-27 DIAGNOSIS — E039 Hypothyroidism, unspecified: Secondary | ICD-10-CM | POA: Diagnosis not present

## 2015-07-27 NOTE — Assessment & Plan Note (Addendum)
Assessment: Recheck TSH within normal limits today.  Plan: Continue levothyroxine 62.59mcg daily

## 2015-07-27 NOTE — Assessment & Plan Note (Addendum)
Assessment: BP improved. She has improved PO intake as well.  Plan:  Decrease fludrocortisone from 0.2mg  daily to 0.1mg  daily for 1 week, then stop. Follow up in 1 month to recheck BMP

## 2015-07-27 NOTE — Patient Instructions (Addendum)
Lower your Florinef dose to 1 tablet (0.1 mg) daily for 1 week. Then stop taking it.  Follow up in 1 month.  Please talk to your guardian about whether you will want a mammogram or pap smear.

## 2015-07-27 NOTE — Progress Notes (Signed)
Subjective:    Patient ID: Amanda Castillo, female    DOB: 12-17-1955, 60 y.o.   MRN: [id number]  HPI [name] is a 60 year old woman with history of Down's syndrome, hypothyroidism, CVA, PVD, HTN, sinus bradycardia, HLD, OSA, depression presenting for follow up hypothyroidism.  She is accompanied by her caregivers. She is doing well overall. No further episodes of syncope. She has gotten back to her normal activities without any problems. Denies pain. No dizziness. PO intake greatly improved.  Review of Systems Constitutional: no fevers/chills Genitourinary: no dysuria, no hematuria  Past Medical History  Diagnosis Date  . Hyperlipidemia   . Allergy   . Hypothyroidism   . Depression   . Down's syndrome   . Stroke ([place])     in 2006  . PVD (peripheral vascular disease) ([place])   . Moderate intellectual disability   . Hallucinations   . Sleep apnea   . Hypertension   . Orthostatic hypotension 06/06/2015    Current Outpatient Prescriptions on File Prior to Visit  Medication Sig Dispense Refill  . aspirin 81 MG tablet Take 81 mg by mouth daily.    . cholecalciferol (VITAMIN D) 1000 UNITS tablet Take 1,000 Units by mouth daily.    . cholecalciferol 1000 units tablet Take 1 tablet (1,000 Units total) by mouth daily. 90 tablet 3  . docusate sodium (COLACE) 100 MG capsule Take 1 capsule (100 mg total) by mouth daily as needed for mild constipation. 10 capsule   . docusate sodium (COLACE) 100 MG capsule Take 1 capsule (100 mg total) by mouth daily. 10 capsule 0  . feeding supplement, ENSURE ENLIVE, (ENSURE ENLIVE) LIQD Take 237 mLs by mouth 2 (two) times daily between meals. 237 mL 12  . fludrocortisone (FLORINEF) 0.1 MG tablet Take 2 tablets (0.2 mg total) by mouth daily with breakfast. 60 tablet 3  . levothyroxine (SYNTHROID, LEVOTHROID) 125 MCG tablet Take 0.5 tablets (62.5 mcg total) by mouth daily before breakfast. 90 tablet 3  . loratadine (CLARITIN) 10 MG tablet  Take 10 mg by mouth daily.    Marland Kitchen Propylene Glycol (SYSTANE BALANCE OP) Apply 1 drop to eye daily. Place 1 drop in each eye, every day    . simvastatin (ZOCOR) 40 MG tablet Take 40 mg by mouth at bedtime. At bedtime    . sodium chloride (OCEAN) 0.65 % SOLN nasal spray Place 1 spray into both nostrils as needed for congestion.    . terbinafine (LAMISIL) 1 % cream Apply 1 application topically 2 (two) times daily.     No current facility-administered medications on file prior to visit.    Today's Vitals   07/27/15 1530  BP: 135/68  Pulse: 56  Temp: 98.4 F (36.9 C)  TempSrc: Oral  Height: 5\' 2"  (1.575 m)  Weight: 144 lb 4.8 oz (65.454 kg)  SpO2: 100%  PainSc: 0-No pain   Objective:   Physical Exam  Constitutional: She appears well-developed and well-nourished. No distress.  Cardiovascular: Normal rate, regular rhythm and normal heart sounds.   Pulmonary/Chest: Effort normal and breath sounds normal. She has no rales.  Neurological: She is alert. Coordination normal.   Assessment & Plan:  Hypothyroidism Assessment: Recheck TSH within normal limits today.  Plan: Continue levothyroxine 62.16mcg daily   Orthostatic hypotension Assessment: BP improved. She has improved PO intake as well.  Plan:  Decrease fludrocortisone from 0.2mg  daily to 0.1mg  daily for 1 week, then stop. Follow up in 1 month to recheck BMP  Health care maintenance Pap and Mammogram done in 2016. Will obtain records.

## 2015-07-27 NOTE — Assessment & Plan Note (Addendum)
Pap and Mammogram done in 2016. Will obtain records.

## 2015-07-28 ENCOUNTER — Other Ambulatory Visit: Payer: Self-pay | Admitting: Pulmonary Disease

## 2015-07-28 LAB — TSH: TSH: 1.04 u[IU]/mL (ref 0.450–4.500)

## 2015-07-28 NOTE — Telephone Encounter (Signed)
Care giver requesting fludrocortisone to be filled @ [facility].

## 2015-07-28 NOTE — Telephone Encounter (Signed)
Called pharm, pt has last script that has never been filled, he will notify caregiver when it is ready for pic up

## 2015-07-28 NOTE — Progress Notes (Signed)
Internal Medicine Clinic Attending  Case discussed with Dr. Krall at the time of the visit.  We reviewed the resident's history and exam and pertinent patient test results.  I agree with the assessment, diagnosis, and plan of care documented in the resident's note.  

## 2015-08-28 ENCOUNTER — Telehealth: Payer: Self-pay | Admitting: Podiatry

## 2015-08-28 NOTE — Telephone Encounter (Signed)
I returned pts call and had to leave a message for her to call back to schedule an appt

## 2015-09-05 ENCOUNTER — Other Ambulatory Visit: Payer: Self-pay | Admitting: Pulmonary Disease

## 2015-09-05 NOTE — Telephone Encounter (Signed)
PHARMACY TOLD PATIENT THEY FAXED OVER REFILL REQUEST FROM [facility] [phone] YESTERDAY FOR VIT. D, DID WE RECEIVE FAX?

## 2015-09-05 NOTE — Telephone Encounter (Signed)
I do not have a faxed refill request from pharmacy, but will send request to pcp.Despina Hidden Cassady3/28/20172:17 PM  Upon further review of chart, pt has refills on vit D-call made to pharmacy and I was informed that a "gentleman" has picked up rx today. Called pt back and was informed by family member that pt also needed simvastatin and loratadine refills, along with an appt for next month.  appt made and rx request sent to pcp.Despina Hidden Cassady3/28/20172:30 PM

## 2015-09-06 ENCOUNTER — Other Ambulatory Visit: Payer: Self-pay | Admitting: Pulmonary Disease

## 2015-09-06 ENCOUNTER — Ambulatory Visit (INDEPENDENT_AMBULATORY_CARE_PROVIDER_SITE_OTHER): Payer: Medicare Other | Admitting: Podiatry

## 2015-09-06 ENCOUNTER — Encounter: Payer: Self-pay | Admitting: Podiatry

## 2015-09-06 DIAGNOSIS — M79676 Pain in unspecified toe(s): Secondary | ICD-10-CM

## 2015-09-06 DIAGNOSIS — M79675 Pain in left toe(s): Secondary | ICD-10-CM

## 2015-09-06 DIAGNOSIS — B351 Tinea unguium: Secondary | ICD-10-CM | POA: Diagnosis not present

## 2015-09-06 NOTE — Telephone Encounter (Signed)
Requesting simvastatin and loratadine to be filled @ [facility].

## 2015-09-06 NOTE — Progress Notes (Addendum)
Patient ID: Amanda Castillo, female   DOB: 1956-02-03, 60 y.o.   MRN: [id number] Trim toe nails   Complaint:  Visit Type: Patient returns to my office for continued preventative foot care services. Complaint: Patient states" my nails have grown long and thick and become painful to walk and wear shoes" Patient has been diagnosed with DM with PVD. She presents for preventative foot care services. No changes to ROS  Podiatric Exam: Vascular: dorsalis pedis and posterior tibial pulses are palpable bilateral. Capillary return is immediate. Temperature gradient is WNL. Skin turgor WNL  Sensorium: Normal Semmes Weinstein monofilament test. Normal tactile sensation bilaterally. Nail Exam: Pt has thick disfigured discolored nails with subungual debris noted bilateral entire nail hallux through fifth toenails Ulcer Exam: There is no evidence of ulcer or pre-ulcerative changes or infection. Orthopedic Exam: Muscle tone and strength are WNL. No limitations in general ROM. No crepitus or effusions noted. Foot type and digits show no abnormalities. Bony prominences are unremarkable. Severe HAV B/L.   Skin: No Porokeratosis. No infection or ulcers.  Chronic tinea pedia both feet.  Diagnosis:  Tinea unguium, Pain in right toe, pain in left toes  Treatment & Plan Procedures and Treatment: Consent by patient was obtained for treatment procedures. The patient understood the discussion of treatment and procedures well. All questions were answered thoroughly reviewed. Debridement of mycotic and hypertrophic toenails, 1 through 5 bilateral and clearing of subungual debris. No ulceration, no infection noted.  Return Visit-Office Procedure: Patient instructed to return to the office for a follow up visit 3 months for continued evaluation and treatment.   Gardiner Barefoot DPM

## 2015-09-07 MED ORDER — SIMVASTATIN 40 MG PO TABS: 40.0000 mg | ORAL_TABLET | Freq: Every day | ORAL | Status: DC

## 2015-09-07 MED ORDER — LORATADINE 10 MG PO TABS: 10.0000 mg | ORAL_TABLET | Freq: Every day | ORAL | Status: DC

## 2015-09-26 ENCOUNTER — Telehealth: Payer: Self-pay

## 2015-09-26 ENCOUNTER — Telehealth: Payer: Self-pay | Admitting: Pulmonary Disease

## 2015-09-26 NOTE — Telephone Encounter (Signed)
Please call pt back.

## 2015-09-26 NOTE — Telephone Encounter (Signed)
APPT. REMINDER CALL, LMTCB °

## 2015-09-27 ENCOUNTER — Ambulatory Visit (INDEPENDENT_AMBULATORY_CARE_PROVIDER_SITE_OTHER): Payer: Medicare Other | Admitting: Pulmonary Disease

## 2015-09-27 ENCOUNTER — Encounter: Payer: Self-pay | Admitting: Pulmonary Disease

## 2015-09-27 VITALS — BP 117/44 | HR 51 | Temp 98.1°F | Wt 140.7 lb

## 2015-09-27 DIAGNOSIS — I951 Orthostatic hypotension: Secondary | ICD-10-CM | POA: Diagnosis not present

## 2015-09-27 DIAGNOSIS — G3184 Mild cognitive impairment, so stated: Secondary | ICD-10-CM | POA: Diagnosis not present

## 2015-09-27 DIAGNOSIS — Z8679 Personal history of other diseases of the circulatory system: Secondary | ICD-10-CM

## 2015-09-27 DIAGNOSIS — F02818 Dementia in other diseases classified elsewhere, unspecified severity, with other behavioral disturbance: Secondary | ICD-10-CM

## 2015-09-27 DIAGNOSIS — F0281 Dementia in other diseases classified elsewhere with behavioral disturbance: Secondary | ICD-10-CM

## 2015-09-27 DIAGNOSIS — Z114 Encounter for screening for human immunodeficiency virus [HIV]: Secondary | ICD-10-CM

## 2015-09-27 NOTE — Progress Notes (Signed)
   Subjective:    Patient ID: EMMELY LAINO, female    DOB: [birth year], 60 y.o.   MRN: [id number]  HPI Ms. BERNADETT SCHULKE is a 60 year old woman with history of Down's syndrome, hypothyroidism, CVA, PVD, HTN, sinus bradycardia, HLD, OSA, depression presenting for follow up hypothyroidism.  No further episodes of syncope. She is doing her normal activities without any problems. No dizziness. PO intake poor due to reporting to her caregiver that she has already eaten when she hadn't. Chocolate milk is helping. Going to see neuropsych next month.   Review of Systems Constitutional: no fevers/chills Respiratory: no shortness of breath Cardiovascular: no chest pain Gastrointestinal: no nausea/vomiting, no diarrhea  Past Medical History  Diagnosis Date  . Hyperlipidemia   . Allergy   . Hypothyroidism   . Depression   . Down's syndrome   . Stroke ([place])     in 2006  . PVD (peripheral vascular disease) ([place])   . Moderate intellectual disability   . Hallucinations   . Sleep apnea   . Hypertension   . Orthostatic hypotension 06/06/2015    Current Outpatient Prescriptions on File Prior to Visit  Medication Sig Dispense Refill  . aspirin 81 MG tablet Take 81 mg by mouth daily.    . cholecalciferol (VITAMIN D) 1000 UNITS tablet Take 1,000 Units by mouth daily.    . cholecalciferol 1000 units tablet Take 1 tablet (1,000 Units total) by mouth daily. 90 tablet 3  . docusate sodium (COLACE) 100 MG capsule Take 1 capsule (100 mg total) by mouth daily as needed for mild constipation. 10 capsule   . docusate sodium (COLACE) 100 MG capsule Take 1 capsule (100 mg total) by mouth daily. 10 capsule 0  . feeding supplement, ENSURE ENLIVE, (ENSURE ENLIVE) LIQD Take 237 mLs by mouth 2 (two) times daily between meals. 237 mL 12  . fludrocortisone (FLORINEF) 0.1 MG tablet Take 2 tablets (0.2 mg total) by mouth daily with breakfast. 60 tablet 3  . levothyroxine (SYNTHROID, LEVOTHROID) 125 MCG tablet  Take 0.5 tablets (62.5 mcg total) by mouth daily before breakfast. 90 tablet 3  . loratadine (CLARITIN) 10 MG tablet Take 1 tablet (10 mg total) by mouth daily. 90 tablet 3  . Propylene Glycol (SYSTANE BALANCE OP) Apply 1 drop to eye daily. Place 1 drop in each eye, every day    . simvastatin (ZOCOR) 40 MG tablet Take 1 tablet (40 mg total) by mouth at bedtime. At bedtime 90 tablet 3  . sodium chloride (OCEAN) 0.65 % SOLN nasal spray Place 1 spray into both nostrils as needed for congestion.    . terbinafine (LAMISIL) 1 % cream Apply 1 application topically 2 (two) times daily.     No current facility-administered medications on file prior to visit.   Objective:   Physical Exam  Blood pressure 117/44, pulse 51, temperature 98.1 F (36.7 C), temperature source Oral, weight 140 lb 11.2 oz (63.821 kg), SpO2 100 %. General Apperance: NAD HEENT: Normocephalic, atraumatic, anicteric sclera Neck: Supple, trachea midline Lungs: Few wheezes but otherwise clear to auscultation bilaterally. Breathing comfortably on room air. Heart: Regular rate and rhythm Abdomen: Soft, nondistended Extremities: Warm and well perfused, no edema Neurologic: Alert and interactive. No gross deficits.  Assessment & Plan:  Please refer to problem based charting.

## 2015-09-27 NOTE — Assessment & Plan Note (Signed)
Assessment: Doing well off of fludrocortisone.  Plan: Rechecking BMP today.

## 2015-09-27 NOTE — Patient Instructions (Signed)
Decreased sense of smell is common in dementia patients and can manifest itself as poor appetite and weight loss. This can sometimes be overcome by increasing other types of sensory stimulation. As an example, caregivers can try adding low sodium soy sauce or salsa to foods, or using recipes aimed at increasing the flavor content of meals.  Other strategies: -Offer finger foods -Smaller, more frequent portions -Favorite foods

## 2015-09-27 NOTE — Assessment & Plan Note (Addendum)
Some PO intake problems due to dementia. Her caregivers are trying to do behavioral modifications to help augment her food intake. She has an appointment with a specialist in dementia and Down syndrome.  Patient suffers from Dementia which impairs her ability to perform daily activities like toileting in the home. A walker will not resolve issues with performing activities of daily living. A wheelchair will allow patient to safely perform daily activities. Patient has a caregiver who can provide assistance. DME order placed for manual wheelchair.

## 2015-09-27 NOTE — Progress Notes (Signed)
Medicine attending: Medical history, presenting problems, physical findings, and medications, reviewed with resident physician Dr Jennifer Krall on the day of the patient visit and I concur with her evaluation and management plan. 

## 2015-09-27 NOTE — Telephone Encounter (Signed)
Pt seen in clinic today.  

## 2015-09-28 LAB — BMP8+ANION GAP
Anion Gap: 19 mmol/L — ABNORMAL HIGH (ref 10.0–18.0)
BUN / CREAT RATIO: 15 (ref 9–23)
BUN: 15 mg/dL (ref 6–24)
CHLORIDE: 101 mmol/L (ref 96–106)
CO2: 24 mmol/L (ref 18–29)
CREATININE: 1 mg/dL (ref 0.57–1.00)
Calcium: 9.4 mg/dL (ref 8.7–10.2)
GFR calc non Af Amer: 62 mL/min/{1.73_m2} (ref >59)
GFR, EST AFRICAN AMERICAN: 72 mL/min/{1.73_m2} (ref >59)
GLUCOSE: 51 mg/dL — AB (ref 65–99)
Potassium: 4.6 mmol/L (ref 3.5–5.2)
Sodium: 144 mmol/L (ref 134–144)

## 2015-09-28 LAB — HIV ANTIBODY (ROUTINE TESTING W REFLEX): HIV SCREEN 4TH GENERATION: NONREACTIVE

## 2015-10-25 ENCOUNTER — Telehealth: Payer: Self-pay | Admitting: *Deleted

## 2015-10-25 NOTE — Telephone Encounter (Signed)
Requesting date of last written levothyroxine half tablet. Per our records 06/08/2015. Relayed this information. No further questions.

## 2015-10-30 DIAGNOSIS — Q909 Down syndrome, unspecified: Secondary | ICD-10-CM | POA: Diagnosis not present

## 2015-10-30 DIAGNOSIS — F0634 Mood disorder due to known physiological condition with mixed features: Secondary | ICD-10-CM | POA: Diagnosis not present

## 2015-11-21 ENCOUNTER — Encounter: Payer: Self-pay | Admitting: Cardiology

## 2015-11-21 ENCOUNTER — Ambulatory Visit (INDEPENDENT_AMBULATORY_CARE_PROVIDER_SITE_OTHER): Payer: Medicare Other | Admitting: Cardiology

## 2015-11-21 VITALS — BP 118/70 | HR 60 | Ht <= 58 in | Wt 140.0 lb

## 2015-11-21 DIAGNOSIS — R55 Syncope and collapse: Secondary | ICD-10-CM

## 2015-11-21 NOTE — Patient Instructions (Signed)
Medication Instructions:  The current medical regimen is effective;  continue present plan and medications.  Follow-Up: Follow up as needed with Dr Skains.  Thank you for choosing [facility]!!     

## 2015-11-21 NOTE — Progress Notes (Signed)
Cardiology Office Note   Date:  11/21/2015   ID:  Amanda Castillo, DOB March 05, 1956, MRN [id number]  PCP:  Jacques Earthly, MD  Cardiologist:   Candee Furbish, MD       History of Present Illness: Amanda Castillo is a 60 y.o. female who presents for follow-up of syncope. She had another episode of syncope which occurred after her caregiver helped her brush her teeth in the bathroom, she was standing and without warning fell backwards and was described to be out for 1 second. As opposed to the previous episode of syncope in the past, there did not appear to be any significant prodrome. No warning.   She had fainted previously at the [employer]. She was seen in the neurology clinic. She had an episode of unresponsiveness in September 2015. Lives in a group home, Down syndrome. She was with her sister with her neck extended as her sister was plucking her hair. She then started having blank look and passed out. They remember her turning grayish blue. She stiffened up, eyes rolled back and she stopped breathing. Brief CPR was done by her sister. First episode occurred in 2013 she became unresponsive briefly. Second episode in 2014 occurred in the bathroom when she was washing her face.  EEG normal, prior left frontal stroke in 2003. MRI was normal with no new stroke.  Her blood pressure previously was approximately 123XX123 systolic on losartan 25 mg, this was discontinued. Her sister thinks that she may have been slightly dehydrated at the instance of her last syncopal episode. This was also in the setting of a slightly painful experience while getting her hair plucked. No sweats. No nausea. No chest pain. No shortness of breath.  Echocardiogram was performed on 11/22/14 and was reassuring.. Prior EKG unremarkable.  02/17/15 - had one more episode of syncope. Went to bathroom, decided to close bedroom door. This was after breakfast. EMS call, snored, woke up right away. Blood pressure was as low  as 98 systolic. [facility] - unremarkable workup. Sinus bradycardia once again noted however no significant pauses.  05/16/15-doing well, no further syncope episodes, event monitor reassuring for length of time she wore.  11/21/15-no further single episodes. Her caretaker is very pleased with Dr. Randell Patient. She has been drinking ensure and this seems to have helped . Continuing to maintain her weight.   Past Medical History  Diagnosis Date  . Hyperlipidemia   . Allergy   . Hypothyroidism   . Depression   . Down's syndrome   . Stroke ([place])     in 2006  . PVD (peripheral vascular disease) ([place])   . Moderate intellectual disability   . Hallucinations   . Sleep apnea   . Hypertension   . Orthostatic hypotension 06/06/2015    Past Surgical History  Procedure Laterality Date  . Orif lt foot    . Tonsillectomy    . Colonoscopy  06/12/2012     Current Outpatient Prescriptions  Medication Sig Dispense Refill  . aspirin 81 MG tablet Take 81 mg by mouth daily.    . cholecalciferol (VITAMIN D) 1000 UNITS tablet Take 1,000 Units by mouth daily.    . cholecalciferol 1000 units tablet Take 1 tablet (1,000 Units total) by mouth daily. 90 tablet 3  . docusate sodium (COLACE) 100 MG capsule Take 1 capsule (100 mg total) by mouth daily as needed for mild constipation. 10 capsule   . docusate sodium (COLACE) 100 MG capsule Take 1 capsule (100  mg total) by mouth daily. 10 capsule 0  . feeding supplement, ENSURE ENLIVE, (ENSURE ENLIVE) LIQD Take 237 mLs by mouth 2 (two) times daily between meals. 237 mL 12  . iloperidone (FANAPT) 4 MG TABS tablet Take 1 mg by mouth daily.    Marland Kitchen levothyroxine (SYNTHROID, LEVOTHROID) 125 MCG tablet Take 0.5 tablets (62.5 mcg total) by mouth daily before breakfast. 90 tablet 3  . loratadine (CLARITIN) 10 MG tablet Take 1 tablet (10 mg total) by mouth daily. 90 tablet 3  . Propylene Glycol (SYSTANE BALANCE OP) Apply 1 drop to eye daily. Place 1 drop in each eye, every day    .  simvastatin (ZOCOR) 40 MG tablet Take 1 tablet (40 mg total) by mouth at bedtime. At bedtime 90 tablet 3  . sodium chloride (OCEAN) 0.65 % SOLN nasal spray Place 1 spray into both nostrils as needed for congestion.    . terbinafine (LAMISIL) 1 % cream Apply 1 application topically 2 (two) times daily.     No current facility-administered medications for this visit.    Allergies:   Review of patient's allergies indicates no known allergies.    Social History:  The patient  reports that she has never smoked. She has never used smokeless tobacco. She reports that she does not drink alcohol or use illicit drugs.   Family History:  The patient's family history includes Colon cancer in her maternal grandfather and paternal uncle.    ROS:  Please see the history of present illness.   Otherwise, review of systems are positive for none.   All other systems are reviewed and negative.    PHYSICAL EXAM: VS:  BP 118/70 mmHg  Pulse 60  Ht 4\' 10"  (1.473 m)  Wt 140 lb (63.504 kg)  BMI 29.27 kg/m2 , BMI Body mass index is 29.27 kg/(m^2). GEN: Well nourished, well developed, in no acute distress, Down syndrome HEENT: normal Neck: no JVD, carotid bruits, or masses, normal carotid upstroke Cardiac: RRR; soft systolic murmur,no rubs, or gallops,no edema  Respiratory:  clear to auscultation bilaterally, normal work of breathing GI: soft, nontender, nondistended, + BS MS: no deformity or atrophy Skin: warm and dry, mild posterior rash above feet Neuro:  Strength and sensation are intact Psych: euthymic mood, full affect   EKG:  10/09/14-sinus bradycardia 50 with nonspecific ST-T wave changes.   Recent Labs: 06/07/2015: Hemoglobin 12.4; Platelets 195 07/27/2015: TSH 1.040 09/27/2015: BUN 15; Creatinine, Ser 1.00; Potassium 4.6; Sodium 144    Lipid Panel No results found for: CHOL, TRIG, HDL, CHOLHDL, VLDL, LDLCALC, LDLDIRECT    Wt Readings from Last 3 Encounters:  11/21/15 140 lb (63.504 kg)    09/27/15 140 lb 11.2 oz (63.821 kg)  07/27/15 144 lb 4.8 oz (65.454 kg)      Other studies Reviewed: Additional studies/ records that were reviewed today include: Prior hospital records, echocardiogram, EKG, lab work. Review of the above records demonstrates: as above   ASSESSMENT AND PLAN:  1.  Vasovagal syncope-Thankfully, no further occurrences. May have been orthostatic component as well as. Previous recurrent episodes of syncope. Most recent episode occurred while just finishing brushing her teeth. She was in the bathroom and then without warning fell backwards, hit her head. She was only out for a second her caregiver states. This episode differs in that was no significant prodrome or warning. She went to the emergency room and had a CT scan which was normal. EKG did show sinus bradycardia with rate in the upper  81s. Previously based upon history, it seemed as though she was having more autonomic dysfunction/vasovagal syncopal episodes. She has been drinking Gatorade or trying to incorporate this into her diet. She is also following tighter stockings.. will not eat very much. Compression hose. Encouraged salt liberalization, fluid intake. Gatorade. full neurologic workup was unremarkable. MRI unremarkable. It is likely that her previous episode occurred in the setting of low blood pressure, mild dehydration, mild pain stimuli and has classic prodrome of ashen appearance, pallor which then resulted in syncope. It is not uncommon during brief episodes of syncope to have brief involuntary muscular movements or snorts as her sister described. In order to help alleviate future episodes, I have discontinued her losartan 25 mg at a prior visit. Her blood pressure was previously 123XX123 systolic. Continue to maintain hydration,  echocardiogram was reassuring. Sometimes electrical disturbances can occur congenitally. I do not see lengthening of PR interval.  Event monitor in October 2016 showed no adverse  arrhythmias.  Discussed the possibility of implantable loop recorder as well. Consider EP consultation as well in future. I would like to continue with conservative management at this point.  2. Sleep apnea-continue to encourage CPAP. She may be a tolerate this.  3. Hypothyroidism-Synthroid  4. Prior stroke 2003-continue simvastatin, aspirin  5. Essential hypertension-currently her blood pressure was low. Stopped losartan. Increase salt intake, fluid liberalization. Ensure seems to be helping.   Current medicines are reviewed at length with the patient today.  The patient does not have concerns regarding medicines.  The following changes have been made:  none Labs/ tests ordered today include: none   No orders of the defined types were placed in this encounter.     Disposition:   FU as needed  Signed, Candee Furbish, MD  11/21/2015 10:28 AM    [facility] [place], [place],   [address] Phone: [phone]; Fax: [phone]

## 2015-12-05 DIAGNOSIS — F0634 Mood disorder due to known physiological condition with mixed features: Secondary | ICD-10-CM | POA: Diagnosis not present

## 2015-12-05 DIAGNOSIS — Q909 Down syndrome, unspecified: Secondary | ICD-10-CM | POA: Diagnosis not present

## 2015-12-06 ENCOUNTER — Ambulatory Visit: Payer: Medicare Other | Admitting: Podiatry

## 2015-12-07 DIAGNOSIS — F0634 Mood disorder due to known physiological condition with mixed features: Secondary | ICD-10-CM | POA: Diagnosis not present

## 2015-12-07 DIAGNOSIS — Z79899 Other long term (current) drug therapy: Secondary | ICD-10-CM | POA: Diagnosis not present

## 2016-02-27 ENCOUNTER — Telehealth: Payer: Self-pay | Admitting: *Deleted

## 2016-02-27 NOTE — Telephone Encounter (Signed)
Called care provider, [name], back - informed I had talked to the Attending ; ok to have pt eval tomorrow. Stated hopes pt will cooperate,get dressed so she can come to the appt.

## 2016-02-27 NOTE — Telephone Encounter (Signed)
Call from pt's care provider-[name] Sharlene Motts - states pt refusing to walk,not sleeping; has hx stroke - wants pt to be"check out". Going on x 2 weeks and getting worse. Hx Down's. She states pt not having any slurred speech, drooping of mouth, no pain, no arm weakness. She called pt's psychiatrist; but in a conference, waiting on a return call. Unable to bring pt to appt today; scheduled for tomorrow am @ 0815 AM.

## 2016-02-27 NOTE — Telephone Encounter (Signed)
I agree... We will evaluate her tomorrow. Thank you

## 2016-02-28 ENCOUNTER — Ambulatory Visit (INDEPENDENT_AMBULATORY_CARE_PROVIDER_SITE_OTHER): Payer: Medicare Other | Admitting: Internal Medicine

## 2016-02-28 VITALS — BP 132/42 | HR 83 | Temp 97.4°F | Ht <= 58 in | Wt 142.3 lb

## 2016-02-28 DIAGNOSIS — R0981 Nasal congestion: Secondary | ICD-10-CM | POA: Diagnosis not present

## 2016-02-28 DIAGNOSIS — F0281 Dementia in other diseases classified elsewhere with behavioral disturbance: Secondary | ICD-10-CM

## 2016-02-28 DIAGNOSIS — J309 Allergic rhinitis, unspecified: Secondary | ICD-10-CM | POA: Diagnosis not present

## 2016-02-28 DIAGNOSIS — R05 Cough: Secondary | ICD-10-CM

## 2016-02-28 DIAGNOSIS — Q909 Down syndrome, unspecified: Secondary | ICD-10-CM

## 2016-02-28 DIAGNOSIS — E039 Hypothyroidism, unspecified: Secondary | ICD-10-CM | POA: Diagnosis not present

## 2016-02-28 DIAGNOSIS — F02818 Dementia in other diseases classified elsewhere, unspecified severity, with other behavioral disturbance: Secondary | ICD-10-CM

## 2016-02-28 DIAGNOSIS — R5383 Other fatigue: Secondary | ICD-10-CM | POA: Diagnosis not present

## 2016-02-28 NOTE — Patient Instructions (Addendum)
Thank you for coming to see me today. It was a pleasure. Today we talked about:   Fatigue:  - I am checking a blood test to make sure your thyroid levels are normal - Please schedule an appointment with your specialist in dementia with Down Syndrome. - We will also check a blood test to make sure your electrolytes look okay.  Cough: -  I think you have a viral upper respiratory infection.  Continue to use your Claritin daily and saline nasal irrigation.  You can also try an over the counter oral or nasal decongestant to help with your sinus congestion symptoms. - if you develop fever, chills, or your sputum becomes thick and green please come back to see [place] sooner.  Please follow-up with [place] in 1 week if not better.  If you have any questions or concerns, please do not hesitate to call the office at [phone].  Take Care,   Jule Ser, DO

## 2016-02-28 NOTE — Assessment & Plan Note (Signed)
A:  Patient with 3 days of cough and congestion.  Today cough became more productive with clear sputum.  No fevers, chills, or SOB.  She has some clear rhinorrhea on exam.  She uses daily Claritin for her allergies.  She also goes to day care where she may have been exposed to sick contacts.  P: - symptoms consistent with either viral URI or her allergic rhinitis.   - have recommended continued use of her antihistamine and recommended OTC saline nasal irrigation and/or nasal steroids such as Flonase to help with congestion.

## 2016-02-28 NOTE — Progress Notes (Signed)
Internal Medicine Clinic Attending  Case discussed with Dr. Wallace at the time of the visit.  We reviewed the resident's history and exam and pertinent patient test results.  I agree with the assessment, diagnosis, and plan of care documented in the resident's note.  

## 2016-02-28 NOTE — Progress Notes (Signed)
CC: here for cough and fatigue  HPI:  [name] is a 60 y.o. woman with a past medical history listed below here today for cough and increased fatigue.  PMHx is significant for Down syndrome, CVA, hypothyroidism, CVD, HTN, OSA, ? 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  Caregiver is present with patient.  Reports 2 weeks of increased fatigue/lethargy and refusing to walk.  Caregiver reports prior history of this attributed to stubbornness but it has not lasted as long.  She reports patient is tired all the time and takes a long time to get her ready when going out.  She is on iloperidone for sleep and reports worsening insomnia over the past 2 weeks as well.  She has contacted the patients psychiatrist to discuss increasing the dose but is awaiting a call back.  She is also planning to return to see her specialist in dementia with Down syndrome.  Her caretaker wants to make sure she did not have a stroke.  She has not noticed any acute mental status changes or neurologic deficits, but instead this gradual decline in her activity level.  She reports adherence to her thyroid medication.  She also reports history of allergies with daily Claritin and saline nasal irrigation.  Over the past 3 days, there has been increased cough and today she is bringing up clear phlegm.  No known sick contacts, however, patient goes to adult day care.    She denies fever, chills, nausea, vomiting, diarrhea, increased urinary frequency.  Patient not complaining of dysuria or pain.  She does report cough and congestion, insomnia, and fatigue..  For details of today's visit and the status of her chronic medical issues please refer to the assessment and plan.   Past Medical History:  Diagnosis Date  . Allergy   . Depression   . Down's syndrome   . Hallucinations   . Hyperlipidemia   . Hypertension   . Hypothyroidism   . Moderate intellectual disability   . Orthostatic hypotension 06/06/2015  .  PVD (peripheral vascular disease) ([place])   . Sleep apnea   . Stroke [facility])    in 2006    Review of Systems:   Please see pertinent ROS reviewed in HPI and problem based charting.   Physical Exam:  Vitals:   02/28/16 0856  BP: (!) 132/42  Pulse: 83  Temp: 97.4 F (36.3 C)  TempSrc: Oral  SpO2: 100%  Weight: 142 lb 4.8 oz (64.5 kg)  Height: 4\' 10"  (1.473 m)   Physical Exam  Constitutional: She is well-developed, well-nourished, and in no distress.  HENT:  Head: Normocephalic and atraumatic.  Clear nasal discharge. No frontal or maxillary sinus tenderness.  Cardiovascular: Normal rate and regular rhythm.   Pulmonary/Chest: Effort normal and breath sounds normal. She has no wheezes. She has no rales.  Neurological: No cranial nerve deficit.  Alert and interactive.  No focal neurological deficits.  Skin: Skin is warm and dry.     Assessment & Plan:   See Encounters Tab for problem based charting.  Patient discussed with Dr. Angelia Mould.  Hypothyroidism A: Symptoms may be due to hypothyroidism so we will plan to check her TSH.  P: - check TSH.  Follow up results as needed.  Dementia associated with other underlying disease with behavioral disturbance A: Symptoms may be related to neurocognitive decline related to her dementia associated with Down syndrome.  I reassured care giver that I do not believe patient had a stroke  as this was a concern of hers since there were no neurologic deficits on exam and care giver did not notice any acute changes, but rather has noticed some gradual decline.  She reports plans to follow up with her psychiatrist and specialist in dementia and Down syndrome.  P: - follow up as needed and agreed with continued follow up with dementia specialist.  Allergic rhinitis A:  Patient with 3 days of cough and congestion.  Today cough became more productive with clear sputum.  No fevers, chills, or SOB.  She has some clear rhinorrhea on exam.  She  uses daily Claritin for her allergies.  She also goes to day care where she may have been exposed to sick contacts.  P: - symptoms consistent with either viral URI or her allergic rhinitis.   - have recommended continued use of her antihistamine and recommended OTC saline nasal irrigation and/or nasal steroids such as Flonase to help with congestion.

## 2016-02-28 NOTE — Assessment & Plan Note (Signed)
A: Symptoms may be related to neurocognitive decline related to her dementia associated with Down syndrome.  I reassured care giver that I do not believe patient had a stroke as this was a concern of hers since there were no neurologic deficits on exam and care giver did not notice any acute changes, but rather has noticed some gradual decline.  She reports plans to follow up with her psychiatrist and specialist in dementia and Down syndrome.  P: - follow up as needed and agreed with continued follow up with dementia specialist.

## 2016-02-28 NOTE — Assessment & Plan Note (Signed)
A: Symptoms may be due to hypothyroidism so we will plan to check her TSH.  P: - check TSH.  Follow up results as needed.

## 2016-02-29 LAB — BMP8+ANION GAP
ANION GAP: 17 mmol/L (ref 10.0–18.0)
BUN / CREAT RATIO: 11 (ref 9–23)
BUN: 13 mg/dL (ref 6–24)
CHLORIDE: 101 mmol/L (ref 96–106)
CO2: 24 mmol/L (ref 18–29)
CREATININE: 1.16 mg/dL — AB (ref 0.57–1.00)
Calcium: 8.9 mg/dL (ref 8.7–10.2)
GFR calc Af Amer: 60 mL/min/{1.73_m2} (ref >59)
GFR calc non Af Amer: 52 mL/min/{1.73_m2} — ABNORMAL LOW (ref >59)
GLUCOSE: 95 mg/dL (ref 65–99)
POTASSIUM: 3.8 mmol/L (ref 3.5–5.2)
SODIUM: 142 mmol/L (ref 134–144)

## 2016-02-29 LAB — TSH: TSH: 0.576 u[IU]/mL (ref 0.450–4.500)

## 2016-03-04 DIAGNOSIS — F0634 Mood disorder due to known physiological condition with mixed features: Secondary | ICD-10-CM | POA: Diagnosis not present

## 2016-03-04 DIAGNOSIS — Q909 Down syndrome, unspecified: Secondary | ICD-10-CM | POA: Diagnosis not present

## 2016-04-10 ENCOUNTER — Ambulatory Visit: Payer: Medicare Other | Admitting: Podiatry

## 2016-04-11 ENCOUNTER — Ambulatory Visit: Payer: Medicare Other | Admitting: Podiatry

## 2016-05-16 ENCOUNTER — Ambulatory Visit: Payer: Medicare Other | Admitting: Neurology

## 2016-05-21 ENCOUNTER — Ambulatory Visit (INDEPENDENT_AMBULATORY_CARE_PROVIDER_SITE_OTHER): Payer: Medicare Other | Admitting: Neurology

## 2016-05-21 ENCOUNTER — Encounter: Payer: Self-pay | Admitting: Neurology

## 2016-05-21 VITALS — BP 116/70 | HR 59 | Ht <= 58 in | Wt 145.1 lb

## 2016-05-21 DIAGNOSIS — F02818 Dementia in other diseases classified elsewhere, unspecified severity, with other behavioral disturbance: Secondary | ICD-10-CM

## 2016-05-21 DIAGNOSIS — Q909 Down syndrome, unspecified: Secondary | ICD-10-CM

## 2016-05-21 DIAGNOSIS — F0281 Dementia in other diseases classified elsewhere with behavioral disturbance: Secondary | ICD-10-CM | POA: Diagnosis not present

## 2016-05-21 NOTE — Progress Notes (Signed)
NEUROLOGY FOLLOW UP OFFICE NOTE  Amanda Castillo KX:[id number]  HISTORY OF PRESENT ILLNESS: I had the pleasure of seeing Amanda Castillo in follow-up in the neurology clinic on 05/21/2016.  The patient was last seen a year ago for syncope and dementia with behavioral changes. She is again accompanied by home health staff today who help supplement the history. There have been no further syncopal episodes in the past year, last reported episode was 02/2015. On her last visit, caregiver reported concern for dementia, she would be up all night packing her clothes. She was referred to Psychiatry and kindly seen by Amanda Castillo in February 2017, there is no indication for psychotropic medication. 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 She was referred to Amanda Castillo at [facility], who has helped Amanda Castillo significantly. She sleeps better now and has stopped packing away through the night. She still has behavioral issues, some days all she wants to do is stay in bed and can be very stubborn. They have started to do her showers at night as she does better. She occasionally has bouts of yelling, screaming, and crying, her caregiver will let her have her tantrum, then comes back and Amanda Castillo would be smiling again.   HPI: This is a pleasant 60 yo woman with a history of Down syndrome with cognitive impairment, hypertension, hyperlipidemia, left frontal stroke in 2003, with recurrent episodes of loss of consciousness. She presented initially after an episode of unresponsiveness last 02/22/2014. Per group home staff, Amanda Castillo was being assisted in the bathroom, sitting on the commode when she blanked out. Staff was trying to hold her up. Per ER notes, she was standing and developed shaking of both arm and legs. They sat her down on the toilet and she was shaking for about 1 minute. They noted she was nonverbal. She had a mild abrasion in her inner lip that appeared to be new per  facility. She complained of a mild headache in the ER but was back to baseline. No recent infections. Glucose normal per EMS. Her CBC showed a WBC of 13.2, otherwise normal. CMP normal. She had a head CT without contrast which I personally reviewed, no acute changes, there was an old left frontal infarct. Her sister reports that this is the third episode of loss of consciousness, however she has no prior history of seizures. The first episode occurred in 2013 when she became unresponsive briefly. The second occurred in 05/2013, she was in the bathroom and lost consciousness while washing her face. With the stroke in 2003, she had been living with her mother and was noted to have a faraway look then fell to her knees and started shaking, all she could say was "mommy, mommy."  She had another episode with her sister in the bathroom on 10/09/14, with her neck extended as her sister was plucking her chin hairs. She then started having a blank look and then passed out. She turned grayish blue, her sister helped her down to the floor, where she stiffened with eyes rolled up and stopped breathing. Her sister did CPR with 3 chest compressions then she woke up with no post-event confusion.   There is no prior history of seizures. Her sister and staff have not noticed any other episodes of staring/unresponsiveness or myoclonic jerks. Her sister states that Amanda Castillo was moved to a new group home and has not been doing well there, with a change in behavior. She would mimic her co-residents, and now  makes different facial expressions and mouth movements. Group home staff stated that they do not notice this change in behavior themselves. English has some difficulty expressing herself, denying any headaches, dizziness, diplopia, dysarthria, dysphagia, neck/back pain, focal numbness/tingling/weakness.  According to her sister, Amanda Castillo had an uneventful delivery, delayed development with Down syndrome. There is no history of  febrile convulsions, CNS infections such as meningitis/encephalitis, significant traumatic brain injury, neurosurgical procedures, or family history of seizures.  Diagnostic Data: EEG 12/29/12: This EEG could be read within normal limits for age and levels of consciousness. There was a 2- second burst of high voltage slowing which occurred at the end of photic stimulation. It's difficult to know what this means. Overall this was a normal tracing, done in awake without drowsiness or sleep captured. EEG 10/06/13: Ambulatory EEG terminated around the 12th hour. There is left temporal delta slowing seen. No epileptiform discharges seen.  EEG 11/16/13: This EEG is normal for age and levels of consciousness done primarily in awake but in a finely relaxed patient. MRI brain without contrast 03/2014 did not show any acute changes. There was a remote left frontal lobe infarct with ex vacuo dilation of the left lateral ventricle, and a smaller remote right parietal infarct. Mild generalized atrophy.  PAST MEDICAL HISTORY: Past Medical History:  Diagnosis Date  . Allergy   . Depression   . Down's syndrome   . Hallucinations   . Hyperlipidemia   . Hypertension   . Hypothyroidism   . Moderate intellectual disability   . Orthostatic hypotension 06/06/2015  . PVD (peripheral vascular disease) ([place])   . Sleep apnea   . Stroke [facility])    in 2006    MEDICATIONS: Current Outpatient Prescriptions on File Prior to Visit  Medication Sig Dispense Refill  . aspirin 81 MG tablet Take 81 mg by mouth daily.    . cholecalciferol (VITAMIN D) 1000 UNITS tablet Take 1,000 Units by mouth daily.    . cholecalciferol 1000 units tablet Take 1 tablet (1,000 Units total) by mouth daily. 90 tablet 3  . docusate sodium (COLACE) 100 MG capsule Take 1 capsule (100 mg total) by mouth daily as needed for mild constipation. 10 capsule   . docusate sodium (COLACE) 100 MG capsule Take 1 capsule (100 mg total) by mouth daily. 10  capsule 0  . feeding supplement, ENSURE ENLIVE, (ENSURE ENLIVE) LIQD Take 237 mLs by mouth 2 (two) times daily between meals. 237 mL 12  . iloperidone (FANAPT) 4 MG TABS tablet Take 1 mg by mouth daily.    Marland Kitchen levothyroxine (SYNTHROID, LEVOTHROID) 125 MCG tablet Take 0.5 tablets (62.5 mcg total) by mouth daily before breakfast. 90 tablet 3  . loratadine (CLARITIN) 10 MG tablet Take 1 tablet (10 mg total) by mouth daily. 90 tablet 3  . Propylene Glycol (SYSTANE BALANCE OP) Apply 1 drop to eye daily. Place 1 drop in each eye, every day    . simvastatin (ZOCOR) 40 MG tablet Take 1 tablet (40 mg total) by mouth at bedtime. At bedtime 90 tablet 3  . sodium chloride (OCEAN) 0.65 % SOLN nasal spray Place 1 spray into both nostrils as needed for congestion.    . terbinafine (LAMISIL) 1 % cream Apply 1 application topically 2 (two) times daily.     No current facility-administered medications on file prior to visit.     ALLERGIES: No Known Allergies  FAMILY HISTORY: Family History  Problem Relation Age of Onset  . Colon cancer Paternal Uncle   .  Colon cancer Maternal Grandfather     SOCIAL HISTORY: Social History   Social History  . Marital status: Single    Spouse name: N/A  . Number of children: N/A  . Years of education: N/A   Occupational History  . Not on file.   Social History Main Topics  . Smoking status: Never Smoker  . Smokeless tobacco: Never Used  . Alcohol use No  . Drug use: No  . Sexual activity: Not on file   Other Topics Concern  . Not on file   Social History Narrative  . No narrative on file    REVIEW OF SYSTEMS limited due to mental status, patient denies any symptoms as below: Constitutional: No fevers, chills, or sweats, no generalized fatigue, change in appetite Eyes: No visual changes, double vision, eye pain Ear, nose and throat: No hearing loss, ear pain, nasal congestion, sore throat Cardiovascular: No chest pain, palpitations Respiratory:  No  shortness of breath at rest or with exertion, wheezes GastrointestinaI: No nausea, vomiting, diarrhea, abdominal pain, fecal incontinence Genitourinary:  No dysuria, urinary retention or frequency Musculoskeletal:  No neck pain, back pain Integumentary: No rash, pruritus, skin lesions Neurological: as above Psychiatric: No depression, insomnia, anxiety Endocrine: No palpitations, fatigue, diaphoresis, mood swings, change in appetite, change in weight, increased thirst Hematologic/Lymphatic:  No anemia, purpura, petechiae. Allergic/Immunologic: no itchy/runny eyes, nasal congestion, recent allergic reactions, rashes  PHYSICAL EXAM: Vitals:   05/21/16 1426  BP: 116/70  Pulse: (!) 59   General:No acute distress, Down syndrome facies Head: Normocephalic/atraumatic Neck: supple, no paraspinal tenderness, full range of motion Back: No paraspinal tenderness Heart: regular rate and rhythm Lungs: Clear to auscultation bilaterally. Vascular: No carotid bruits. Skin/Extremities: No rash, no edema Neurological Exam: Mental status: alert and oriented to person, place, knows upcoming Christmas holiday, mild dysarthria, no aphasia, Fund of knowledge is reduced. Recent and remote memory are impaired, patient mostly says yes to all questions (similar to prior). 0/3 delayed recall. Attention and concentration are reduced. Able to name objects and repeat phrases. Cranial nerves: CN I: not tested CN II: pupils equal, round and reactive to light, visual fields intact CN III, IV, VI: full range of motion, no nystagmus, no ptosis CN V: facial sensation intact CN VII: upper and lower face symmetric CN VIII: hearing intact to finger rub CN IX, X: uvula midline CN XI: sternocleidomastoid and trapezius muscles intact CN XII: tongue midline Bulk & Tone: hypotonic, no fasciculations. Motor: 5/5 throughout with no pronator drift. Sensation: intact to light touch. No extinction to double simultaneous  stimulation. Romberg test negative Deep Tendon Reflexes: +1 throughout, no ankle clonus Plantar responses: downgoing bilaterally Cerebellar: no incoordination on finger to nose testing Gait: slow and cautious, wide-based, uses walker Tremor: none  IMPRESSION: This is a 61 yo woman with Down syndrome with moderate cognitive impairment, hypertension, hyperlipidemia, and left frontal stroke in 2003 with no residual deficits, who presented with recurrent syncope, likely vasovagal. She continues to follow-up with Cardiology, no events in a year, consideration for implantable loop recorder if episodes recur. From a neurologic standpoint, MRI brain did not show any new stroke. She does have a history of prior strokes which may predispose her to seizures, however all testing to date (multiple EEGs) have not shown any clear evidence for epilepsy. There was concern from her caregiver about dementia associated with Down syndrome, she was evaluated by psychiatry and felt to be developing progressive Alzheimer's dementia. The behavioral changes have improved with medication  changes made by her psychiatrist, she will continue to follow-up with [facility]. She will follow-up on a prn basis and staff knows to call for any changes.   Thank you for allowing me to participate in her care.  Please do not hesitate to call for any questions or concerns.  The duration of this appointment visit was 25 minutes of face-to-face time with the patient.  Greater than 50% of this time was spent in counseling, explanation of diagnosis, planning of further management, and coordination of care.   Ellouise Newer, M.D.   CC: Dr. Marlou Porch, Dr. Randell Patient

## 2016-05-21 NOTE — Patient Instructions (Signed)
Continue all your medications. Follow-up on as needed basis, call for any changes

## 2016-05-23 ENCOUNTER — Encounter: Payer: Self-pay | Admitting: Neurology

## 2016-06-28 IMAGING — CR DG KNEE COMPLETE 4+V*R*
3 series · 3 of 3 positions shown · non-contrast
Comparison: Right femur and tibia/fibula views 02/20/2011

CLINICAL DATA: Right knee pain after fall.

EXAM:
RIGHT KNEE - COMPLETE 4+ VIEW

[t knee oblique right (1 of 2)]
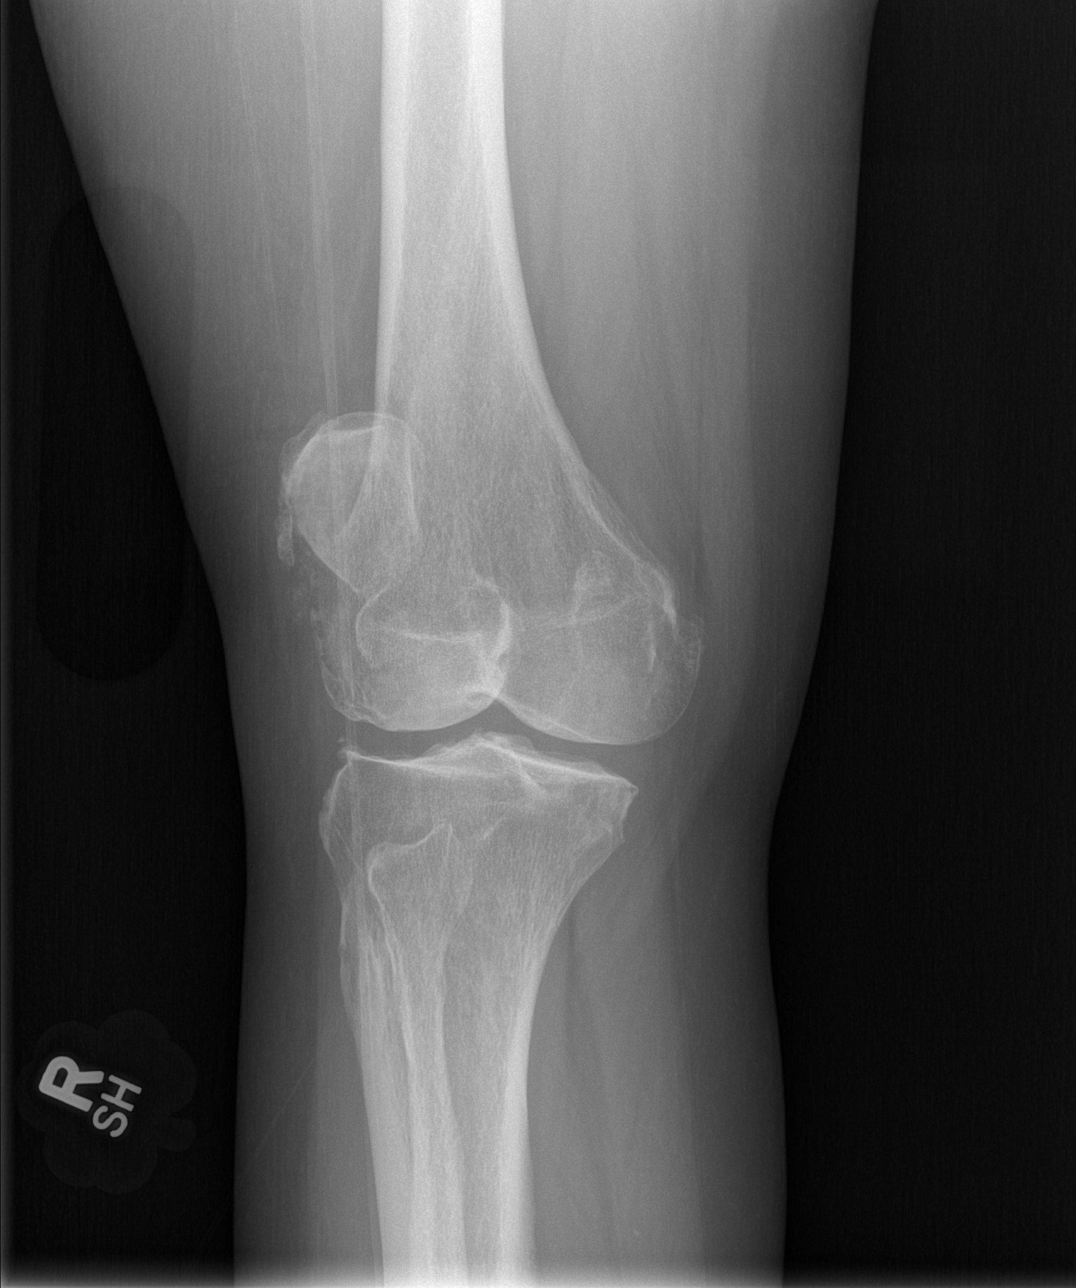

[t knee oblique right (2 of 2)]
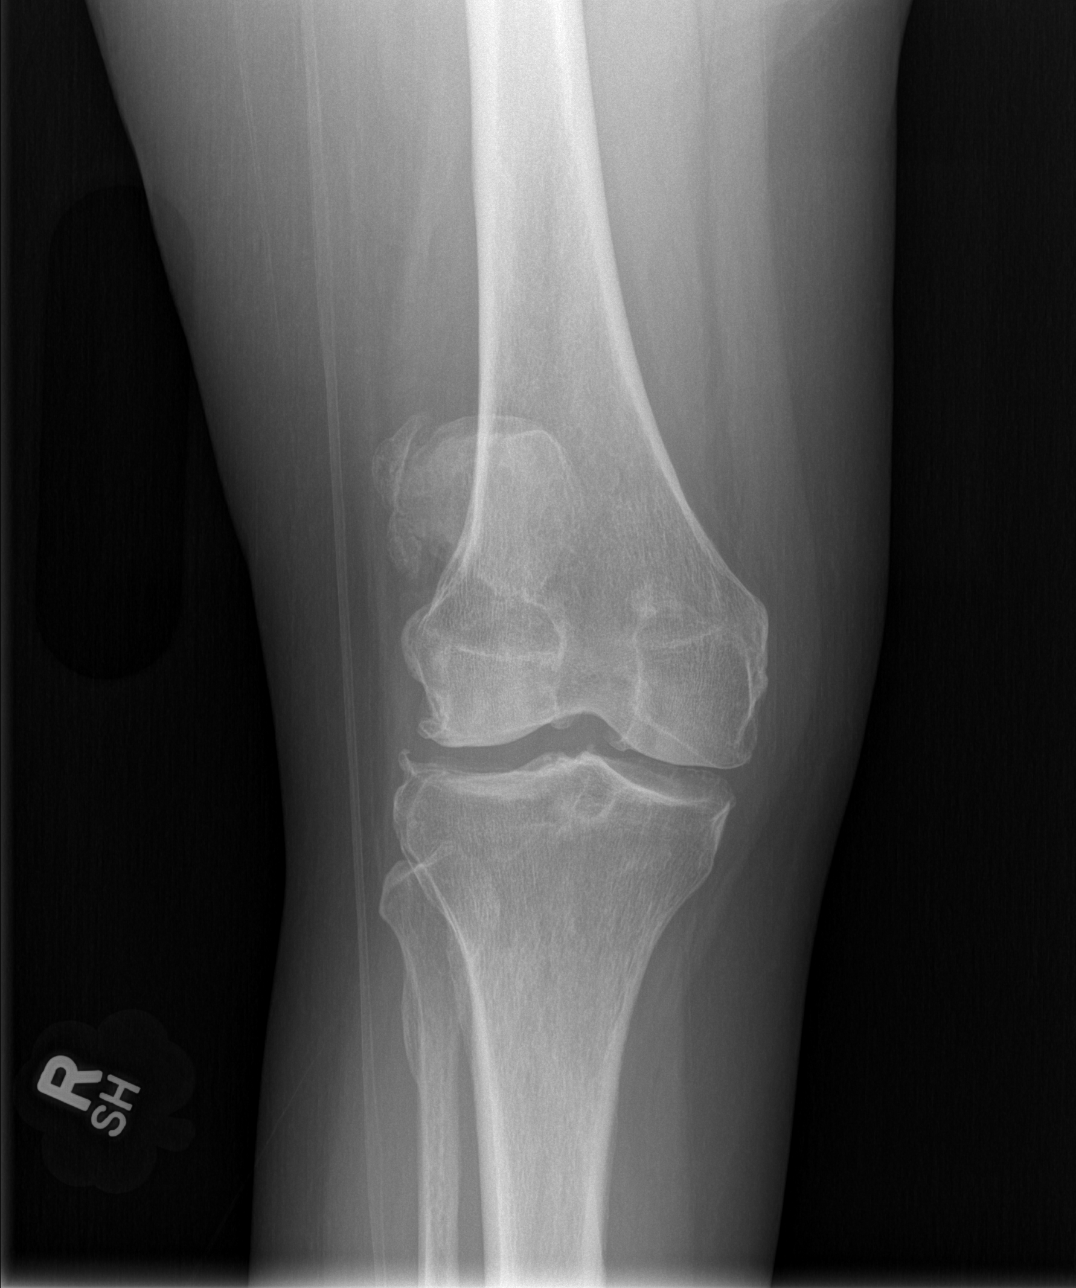

[t knee lat right]
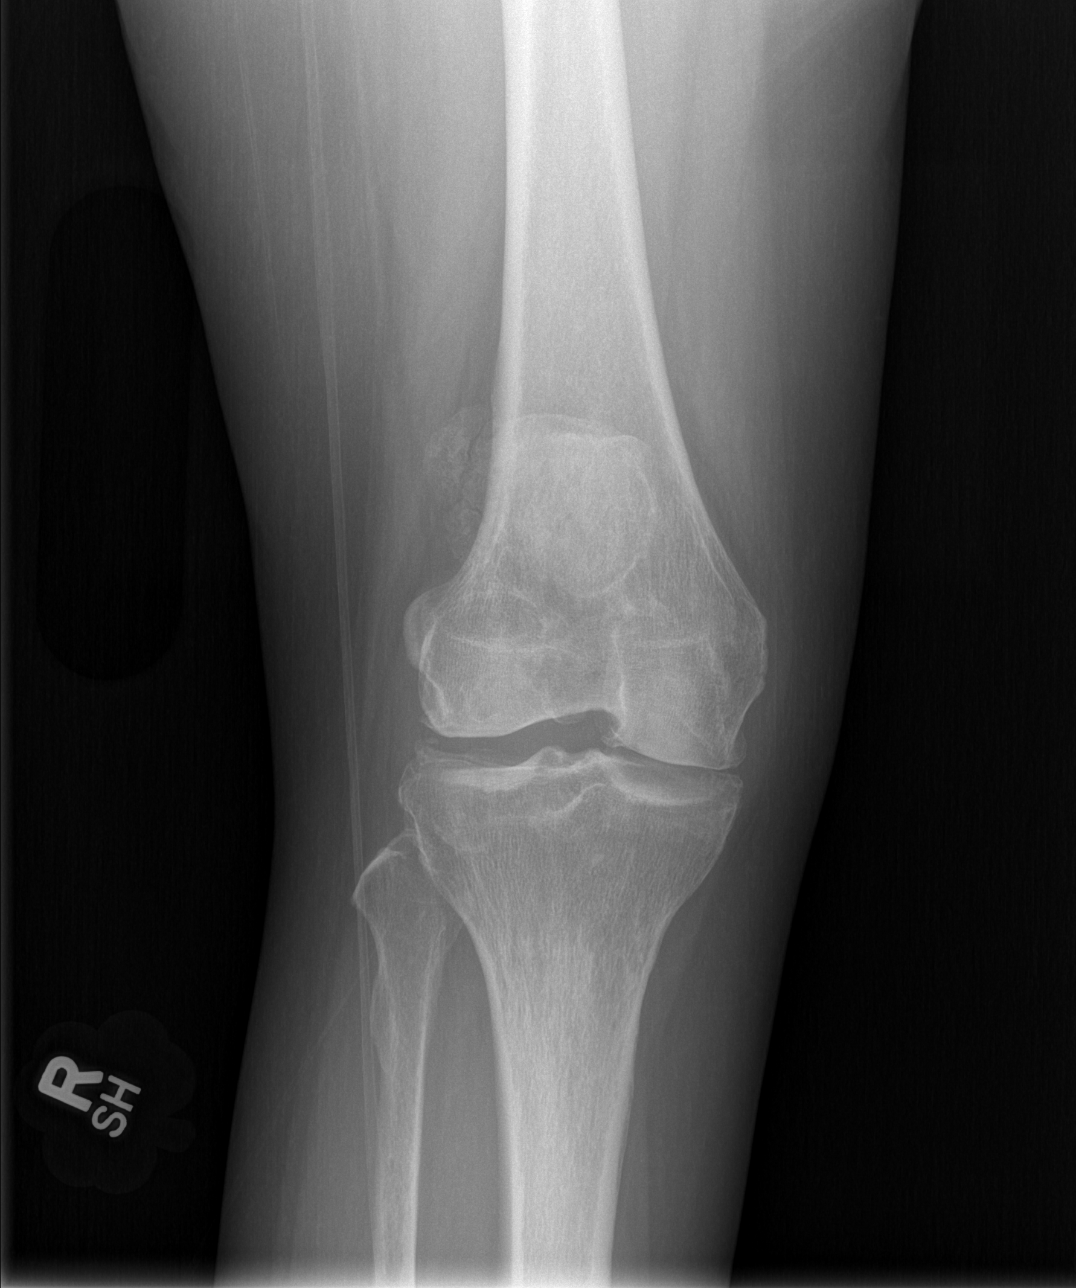

[3 of 3 positions shown; findings below may reference images not displayed]

FINDINGS: No fracture or dislocation. There is tricompartmental osteoarthritis
with peripheral osteophytes that appears progressed from prior exam.
Amorphous well corticated calcific densities are seen adjacent to
the lateral patella, increased from prior, enthesopathy versus
heterotopic calcification. No joint effusion.
IMPRESSION: No fracture or dislocation of the right knee. Progressive
osteoarthritis.

## 2016-07-02 ENCOUNTER — Telehealth: Payer: Self-pay | Admitting: Pulmonary Disease

## 2016-07-02 NOTE — Telephone Encounter (Signed)
APT. REMINDER CALL, LMTCB °

## 2016-07-03 ENCOUNTER — Ambulatory Visit (INDEPENDENT_AMBULATORY_CARE_PROVIDER_SITE_OTHER): Payer: Medicare Other | Admitting: Internal Medicine

## 2016-07-03 ENCOUNTER — Encounter: Payer: Self-pay | Admitting: Internal Medicine

## 2016-07-03 VITALS — BP 106/79 | HR 61 | Temp 98.2°F | Wt 144.2 lb

## 2016-07-03 DIAGNOSIS — Z8679 Personal history of other diseases of the circulatory system: Secondary | ICD-10-CM

## 2016-07-03 DIAGNOSIS — Q909 Down syndrome, unspecified: Secondary | ICD-10-CM | POA: Diagnosis not present

## 2016-07-03 DIAGNOSIS — E785 Hyperlipidemia, unspecified: Secondary | ICD-10-CM | POA: Diagnosis not present

## 2016-07-03 DIAGNOSIS — F0634 Mood disorder due to known physiological condition with mixed features: Secondary | ICD-10-CM | POA: Diagnosis not present

## 2016-07-03 DIAGNOSIS — Z9181 History of falling: Secondary | ICD-10-CM

## 2016-07-03 DIAGNOSIS — Z993 Dependence on wheelchair: Secondary | ICD-10-CM

## 2016-07-03 DIAGNOSIS — F0281 Dementia in other diseases classified elsewhere with behavioral disturbance: Secondary | ICD-10-CM

## 2016-07-03 DIAGNOSIS — F02818 Dementia in other diseases classified elsewhere, unspecified severity, with other behavioral disturbance: Secondary | ICD-10-CM

## 2016-07-03 NOTE — Progress Notes (Signed)
   CC: Health care maintenance visit, follow up for syncope  HPI:  [name] is a 61 y.o. woman with Down's syndrome here today need an annual maintenance examination and to follow up for an unwitnessed fall on 1/13 where she was found by her caretaker sitting on the floor. Upon being assisted to a standing position she passed out and was unresponsive for about 30 seconds. She woke back up but passed out again immediately after for about 10 more seconds. EMS was contacted and came to evaluate seeing normal workup for blood pressure, CBG, and EKG. Her most recent other syncopal episode was last year that occurred under circumstances of decreased PO intake. Her caregiver does not report any specific acute illness at this time but getting her to eat well is a constant challenge. She underwent evaluation at the Cardiology clinic which revealed no obvious cause. She was offered ambulatory heart monitoring but will not tolerate the device. She was also recently seen by Neurology last month who also suspect vasovagal syncope as a likely cause for her infrequent events due to negative evaluations for possible seizure disorder.  See problem based assessment and plan below for additional details  Past Medical History:  Diagnosis Date  . Allergy   . Depression   . Down's syndrome   . Hallucinations   . Hyperlipidemia   . Hypertension   . Hypothyroidism   . Moderate intellectual disability   . Orthostatic hypotension 06/06/2015  . PVD (peripheral vascular disease) ([place])   . Sleep apnea   . Stroke [facility])    in 2006    Review of Systems:  Review of Systems  Constitutional: Negative for chills and fever.  HENT: Negative for hearing loss.   Eyes: Negative for blurred vision.  Respiratory: Negative for cough and wheezing.   Cardiovascular: Negative for chest pain.  Gastrointestinal: Positive for constipation. Negative for vomiting.  Genitourinary: Negative for hematuria.    Musculoskeletal: Positive for falls.  Skin: Negative for rash.  Endo/Heme/Allergies: Positive for environmental allergies.  Psychiatric/Behavioral: Positive for memory loss.   ROS somewhat limited by patient mental status, largely caregiver reported  Physical Exam: Physical Exam  Constitutional: She is well-developed, well-nourished, and in no distress.  HENT:  Head: Normocephalic and atraumatic.  Eyes: Conjunctivae are normal.  Cardiovascular: Normal rate and regular rhythm.   Pulmonary/Chest: Effort normal and breath sounds normal.  Abdominal: Soft. There is no tenderness.  Musculoskeletal: She exhibits no edema.  Lymphadenopathy:    She has no cervical adenopathy.  Skin: Skin is warm and dry. No rash noted.    Vitals:   07/03/16 1326  BP: 106/79  Pulse: 61  Temp: 98.2 F (36.8 C)  TempSrc: Oral  SpO2: 100%  Weight: 144 lb 3.2 oz (65.4 kg)    Assessment & Plan:   See Encounters Tab for problem based charting.  Patient discussed with Dr. Beryle Beams

## 2016-07-03 NOTE — Patient Instructions (Signed)
It was a pleasure to see you today Amanda Castillo.  We are going to check some blood tests today and see if there is anything we need to change in your medications. Otherwise I am glad to hear things are going well.  Your syncopal episode is most likely situational. Be advise to maintain a good intake of salt and water since getting dehydrated will predispose you to similar episodes. If this becomes a more frequent problem we may need to consider a Cardiologist about monitoring options.

## 2016-07-04 LAB — CBC WITH DIFFERENTIAL/PLATELET
BASOS ABS: 0.1 10*3/uL (ref 0.0–0.2)
Basos: 1 %
EOS (ABSOLUTE): 0 10*3/uL (ref 0.0–0.4)
Eos: 1 %
HEMOGLOBIN: 14.5 g/dL (ref 11.1–15.9)
Hematocrit: 42.5 % (ref 34.0–46.6)
Immature Grans (Abs): 0 10*3/uL (ref 0.0–0.1)
Immature Granulocytes: 0 %
LYMPHS ABS: 1.6 10*3/uL (ref 0.7–3.1)
Lymphs: 31 %
MCH: 31.2 pg (ref 26.6–33.0)
MCHC: 34.1 g/dL (ref 31.5–35.7)
MCV: 91 fL (ref 79–97)
MONOS ABS: 0.3 10*3/uL (ref 0.1–0.9)
Monocytes: 6 %
NEUTROS ABS: 3.2 10*3/uL (ref 1.4–7.0)
Neutrophils: 61 %
PLATELETS: 229 10*3/uL (ref 150–379)
RBC: 4.65 x10E6/uL (ref 3.77–5.28)
RDW: 15.4 % (ref 12.3–15.4)
WBC: 5.2 10*3/uL (ref 3.4–10.8)

## 2016-07-04 LAB — LIPID PANEL
CHOL/HDL RATIO: 2.5 ratio (ref 0.0–4.4)
Cholesterol, Total: 154 mg/dL (ref 100–199)
HDL: 62 mg/dL (ref >39)
LDL CALC: 72 mg/dL (ref 0–99)
TRIGLYCERIDES: 102 mg/dL (ref 0–149)
VLDL Cholesterol Cal: 20 mg/dL (ref 5–40)

## 2016-07-04 LAB — CMP14 + ANION GAP
A/G RATIO: 1.5 (ref 1.2–2.2)
ALBUMIN: 4 g/dL (ref 3.5–5.5)
ALK PHOS: 78 IU/L (ref 39–117)
ALT: 20 IU/L (ref 0–32)
AST: 23 IU/L (ref 0–40)
Anion Gap: 17 mmol/L (ref 10.0–18.0)
BUN / CREAT RATIO: 13 (ref 9–23)
BUN: 14 mg/dL (ref 6–24)
CHLORIDE: 102 mmol/L (ref 96–106)
CO2: 24 mmol/L (ref 18–29)
Calcium: 9 mg/dL (ref 8.7–10.2)
Creatinine, Ser: 1.06 mg/dL — ABNORMAL HIGH (ref 0.57–1.00)
GFR calc Af Amer: 67 mL/min/{1.73_m2} (ref >59)
GFR calc non Af Amer: 58 mL/min/{1.73_m2} — ABNORMAL LOW (ref >59)
Globulin, Total: 2.6 g/dL (ref 1.5–4.5)
Glucose: 79 mg/dL (ref 65–99)
POTASSIUM: 4.4 mmol/L (ref 3.5–5.2)
SODIUM: 143 mmol/L (ref 134–144)
Total Protein: 6.6 g/dL (ref 6.0–8.5)

## 2016-07-05 NOTE — Assessment & Plan Note (Signed)
Lipid panel check today at caregiver agency request. The indication for routine repeat monitoring while on appropriate statin therapy is ambiguous, but I see none at all on record for the past 5 years so this is a reasonable test.

## 2016-07-05 NOTE — Assessment & Plan Note (Addendum)
She continues to have increased difficulty performing tasks at home and maintaining adequate PO intake is a persisting problem. There are no new specific deficits but this could certainly be causing or worsening episodes of vasovagal syncope. She is in fair condition today but unfortunately there is decline based on progress over the last year. She was seen today in her wheelchair which is her main transportation method. She is well established with neurology clinic and also seeing a psychiatrist at least annually for dementia recommendations.  She also has documentation today from her care agency requesting some routine health maintenance examination and screen studies. I also provided a list of currently prescribed medications through our clinic.  -CBC, BMP, lipid profile

## 2016-07-07 ENCOUNTER — Encounter: Payer: Self-pay | Admitting: Internal Medicine

## 2016-07-08 NOTE — Progress Notes (Signed)
Medicine attending: Medical history, presenting problems, physical findings, and medications, reviewed with resident physician Dr Christopher Rice on the day of the patient visit and I concur with his evaluation and management plan. 

## 2016-08-27 ENCOUNTER — Telehealth: Payer: Self-pay | Admitting: Pulmonary Disease

## 2016-08-27 NOTE — Telephone Encounter (Signed)
APT. REMINDER CALL, LMTCB °

## 2016-08-28 ENCOUNTER — Other Ambulatory Visit: Payer: Self-pay

## 2016-08-28 ENCOUNTER — Ambulatory Visit (INDEPENDENT_AMBULATORY_CARE_PROVIDER_SITE_OTHER): Payer: Medicare Other | Admitting: Internal Medicine

## 2016-08-28 VITALS — BP 97/56 | HR 52 | Temp 97.6°F | Ht <= 58 in | Wt 143.7 lb

## 2016-08-28 DIAGNOSIS — Z79899 Other long term (current) drug therapy: Secondary | ICD-10-CM | POA: Diagnosis not present

## 2016-08-28 DIAGNOSIS — K5900 Constipation, unspecified: Secondary | ICD-10-CM | POA: Diagnosis not present

## 2016-08-28 DIAGNOSIS — B372 Candidiasis of skin and nail: Secondary | ICD-10-CM

## 2016-08-28 DIAGNOSIS — Q909 Down syndrome, unspecified: Secondary | ICD-10-CM

## 2016-08-28 DIAGNOSIS — R21 Rash and other nonspecific skin eruption: Secondary | ICD-10-CM | POA: Diagnosis present

## 2016-08-28 MED ORDER — NYSTATIN 100000 UNIT/GM EX POWD: Freq: Four times a day (QID) | CUTANEOUS | 0 refills | Status: DC

## 2016-08-28 MED ORDER — DOCUSATE SODIUM 100 MG PO CAPS: 100.0000 mg | ORAL_CAPSULE | Freq: Two times a day (BID) | ORAL | Status: DC

## 2016-08-28 MED ORDER — SIMVASTATIN 40 MG PO TABS: 40.0000 mg | ORAL_TABLET | Freq: Every day | ORAL | 3 refills | Status: DC

## 2016-08-28 NOTE — Patient Instructions (Signed)
Start applying nystatin powder beneath right breast 4 times a day and keep area dry.

## 2016-08-28 NOTE — Progress Notes (Signed)
Internal Medicine Clinic Attending  Case discussed with Dr. Truong at the time of the visit.  We reviewed the resident's history and exam and pertinent patient test results.  I agree with the assessment, diagnosis, and plan of care documented in the resident's note.  

## 2016-08-28 NOTE — Telephone Encounter (Signed)
simvastatin (ZOCOR) 40 MG tablet, refill request @ [facility].

## 2016-08-28 NOTE — Assessment & Plan Note (Signed)
Assessment: Patient presents with a skin rash that her caregiver noticed on Sunday. Patient has not been scratching the rash. Caregiver noted that was erythematous but the erythema has improved since started on Sunday. On exam rash consistent with a candidal skin infection.  Plan: Rx for nystatin powder 4 times a day applied to skin below right breast. Advised the area dry.

## 2016-08-28 NOTE — Progress Notes (Signed)
   CC: Right breast skin rash  HPI:  Amanda Castillo is a 61 y.o. with past medical history as outlined below who presents to clinic for a rash beneath her right breast. Patient has Down syndrome and her caregiver who is present with her is answering all her questions for her. Caregiver is requesting to have her Colace change from when necessary to twice a day as patient is normally constipated. Please see problem list for further details of patient's chronic medical issues.  Past Medical History:  Diagnosis Date  . Allergy   . Depression   . Down's syndrome   . Hallucinations   . Hyperlipidemia   . Hypertension   . Hypothyroidism   . Moderate intellectual disability   . Orthostatic hypotension 06/06/2015  . PVD (peripheral vascular disease) ([place])   . Sleep apnea   . Stroke [facility])    in 2006    Review of Systems:  Her caregiver patient is not having any dizziness, lightheadedness, nausea, vomiting, diarrhea. Her appetite has improved and she is no longer requiring scheduled ensure drinks. Not having any fevers.  Physical Exam:  Vitals:   08/28/16 1035  BP: (!) 97/56  Pulse: (!) 52  Temp: 97.6 F (36.4 C)  TempSrc: Oral  SpO2: 100%  Weight: 143 lb 11.2 oz (65.2 kg)  Height: 4\' 10"  (1.473 m)   Gen.: No acute distress, well-nourished Skin: Patient has large breasts, beneath right breast her skin is erythematous with satellite lesions. No foul odor and no excessive moisture is noted. Left breast skin is wnl.   Assessment & Plan:   See Encounters Tab for problem based charting.  Patient discussed with Dr. Lynnae January

## 2016-08-29 ENCOUNTER — Telehealth: Payer: Self-pay | Admitting: Pulmonary Disease

## 2016-08-29 ENCOUNTER — Other Ambulatory Visit: Payer: Self-pay | Admitting: *Deleted

## 2016-08-29 NOTE — Telephone Encounter (Signed)
levothyroxine (SYNTHROID, LEVOTHROID) 125 MCG tablet  [facility]

## 2016-08-29 NOTE — Telephone Encounter (Signed)
This has been requested in another encounter

## 2016-08-30 MED ORDER — LEVOTHYROXINE SODIUM 125 MCG PO TABS: 62.5000 ug | ORAL_TABLET | Freq: Every day | ORAL | 3 refills | Status: DC

## 2016-09-03 ENCOUNTER — Other Ambulatory Visit: Payer: Self-pay | Admitting: *Deleted

## 2016-09-03 MED ORDER — LORATADINE 10 MG PO TABS: 10.0000 mg | ORAL_TABLET | Freq: Every day | ORAL | 1 refills | Status: DC

## 2016-09-04 ENCOUNTER — Telehealth: Payer: Self-pay | Admitting: Neurology

## 2016-09-04 NOTE — Telephone Encounter (Signed)
They are trying to get a behavior plan together and they think patient needs to be tested for dementia please advise

## 2016-09-05 NOTE — Telephone Encounter (Signed)
Dr. Si Raider does testing for dementia in Down Syndrome, please schedule her for Neuropsych with Dr. Si Raider, thanks

## 2016-09-05 NOTE — Telephone Encounter (Signed)
Will forward message to provider. 

## 2016-09-05 NOTE — Telephone Encounter (Signed)
Clld and spoke to [employer] of Dr.Aquino's recommendations. Transferred call so she could schedule neurocognitive test with Dr. Si Raider.

## 2016-10-03 ENCOUNTER — Encounter: Payer: Self-pay | Admitting: Pulmonary Disease

## 2016-10-03 ENCOUNTER — Ambulatory Visit (INDEPENDENT_AMBULATORY_CARE_PROVIDER_SITE_OTHER): Payer: Medicare Other | Admitting: Pulmonary Disease

## 2016-10-03 VITALS — BP 116/50 | HR 54 | Temp 97.4°F | Ht <= 58 in | Wt 144.5 lb

## 2016-10-03 DIAGNOSIS — Z8679 Personal history of other diseases of the circulatory system: Secondary | ICD-10-CM

## 2016-10-03 DIAGNOSIS — Z124 Encounter for screening for malignant neoplasm of cervix: Secondary | ICD-10-CM

## 2016-10-03 DIAGNOSIS — Z0000 Encounter for general adult medical examination without abnormal findings: Secondary | ICD-10-CM

## 2016-10-03 DIAGNOSIS — Z09 Encounter for follow-up examination after completed treatment for conditions other than malignant neoplasm: Secondary | ICD-10-CM

## 2016-10-03 DIAGNOSIS — I951 Orthostatic hypotension: Secondary | ICD-10-CM

## 2016-10-03 NOTE — Progress Notes (Signed)
   CC: pap smear  HPI:  Amanda Castillo is a 61 y.o. woman with Down's syndrome presenting for pap smear and follow up of her orthostatic hypotension.  She is accompanied by her caregiver. No issues noted. No episodes of syncope. There was one episode where the caregiver thought the patient had had syncope but it seemed more that the patient had pretended. They are working with a specialist for her behavioral management. Recently had some medications changed and is wandering more at night so they will be meeting again soon to address this.   Past Medical History:  Diagnosis Date  . Allergy   . Depression   . Down's syndrome   . Hallucinations   . Hyperlipidemia   . Hypertension   . Hypothyroidism   . Moderate intellectual disability   . Orthostatic hypotension 06/06/2015  . PVD (peripheral vascular disease) ([place])   . Sleep apnea   . Stroke [facility])    in 2006    Review of Systems:   No chest pain No dypsnea  Physical Exam:  Vitals:   10/03/16 1353  BP: (!) 116/50  Pulse: (!) 54  Temp: 97.4 F (36.3 C)  TempSrc: Oral  Weight: 144 lb 8 oz (65.5 kg)   General Apperance: NAD HEENT: Normocephalic, atraumatic, anicteric sclera Neck: Supple, trachea midline Lungs: Clear to auscultation bilaterally. No wheezes, rhonchi or rales. Breathing comfortably Heart: Regular rate and rhythm, no murmur/rub/gallop Abdomen: Soft, nontender, nondistended, no rebound/guarding GU: External genitalia appears normal. She has an intact hymen and I was unable to advance our smallest available speculum to do an internal exam. Extremities: Warm and well perfused, no edema Skin: No rashes or lesions Neurologic: Alert and interactive. No gross deficits.  Assessment & Plan:   See Encounters Tab for problem based charting.  Patient discussed with Dr. Lynnae January

## 2016-10-03 NOTE — Patient Instructions (Addendum)
Follow up in 3-6 months, or sooner as needed

## 2016-10-04 NOTE — Assessment & Plan Note (Signed)
Assessment: Still doing well off of Florinef.  Plan: Resolved

## 2016-10-04 NOTE — Assessment & Plan Note (Signed)
Attempted to do pap smear but limited by our speculum size availablility. Will refer her to gynecology as her caregiver reports her guardian would want her to have screening done.

## 2016-10-07 NOTE — Progress Notes (Signed)
Internal Medicine Clinic Attending  Case discussed with Dr. Krall soon after the resident saw the patient.  We reviewed the resident's history and exam and pertinent patient test results.  I agree with the assessment, diagnosis, and plan of care documented in the resident's note. 

## 2016-10-18 ENCOUNTER — Encounter: Payer: Self-pay | Admitting: Psychology

## 2016-10-21 ENCOUNTER — Encounter: Payer: Self-pay | Admitting: Psychology

## 2016-10-31 DIAGNOSIS — F0634 Mood disorder due to known physiological condition with mixed features: Secondary | ICD-10-CM | POA: Diagnosis not present

## 2016-10-31 DIAGNOSIS — Q909 Down syndrome, unspecified: Secondary | ICD-10-CM | POA: Diagnosis not present

## 2016-11-07 ENCOUNTER — Encounter: Payer: Self-pay | Admitting: Internal Medicine

## 2016-11-07 ENCOUNTER — Ambulatory Visit (INDEPENDENT_AMBULATORY_CARE_PROVIDER_SITE_OTHER): Payer: Medicare Other | Admitting: Internal Medicine

## 2016-11-07 VITALS — BP 126/66 | HR 70 | Temp 97.5°F | Ht 62.0 in | Wt 143.1 lb

## 2016-11-07 DIAGNOSIS — Z9181 History of falling: Secondary | ICD-10-CM | POA: Diagnosis not present

## 2016-11-07 DIAGNOSIS — Q909 Down syndrome, unspecified: Secondary | ICD-10-CM | POA: Diagnosis not present

## 2016-11-07 DIAGNOSIS — R296 Repeated falls: Secondary | ICD-10-CM

## 2016-11-07 DIAGNOSIS — F0281 Dementia in other diseases classified elsewhere with behavioral disturbance: Secondary | ICD-10-CM

## 2016-11-07 DIAGNOSIS — Z7409 Other reduced mobility: Secondary | ICD-10-CM | POA: Diagnosis not present

## 2016-11-07 DIAGNOSIS — Z7689 Persons encountering health services in other specified circumstances: Secondary | ICD-10-CM | POA: Diagnosis present

## 2016-11-07 NOTE — Progress Notes (Signed)
   CC: Wheelchair assessment   HPI:  [name] is a 61 y.o. female with past medical history outlined below here for wheelchair assessment. For the details of today's visit, please refer to the assessment and plan.  Past Medical History:  Diagnosis Date  . Allergy   . Depression   . Down's syndrome   . Hallucinations   . Hyperlipidemia   . Hypertension   . Hypothyroidism   . Moderate intellectual disability   . Orthostatic hypotension 06/06/2015  . PVD (peripheral vascular disease) ([place])   . Sleep apnea   . Stroke [facility])    in 2006    Review of Systems:  Frequent falls. Denies LOC. No chest pain or SOB.   Physical Exam:  Vitals:   11/07/16 0919  BP: 126/66  Pulse: 70  Temp: 97.5 F (36.4 C)  TempSrc: Oral  SpO2: 100%  Weight: 143 lb 1.6 oz (64.9 kg)  Height: 5\' 2"  (1.575 m)    Constitutional: Down syndrome facies, NAD Cardiovascular: RRR, no murmurs, rubs, or gallops.  Pulmonary/Chest: CTAB Abdominal: Soft, non tender, non distended. +BS.  Extremities: Warm and well perfused. No edema.  Psychiatric: Normal mood and affect  Assessment & Plan:   See Encounters Tab for problem based charting.  Patient discussed with Dr. Lynnae Castillo

## 2016-11-07 NOTE — Assessment & Plan Note (Addendum)
Amanda Castillo is here for a mobility examination for power chair and shower chair assessment. Patient suffers from dementia and down syndrome which impairs her ability to perform daily activities like toileting, dressing, grooming, and bathing. She suffers from frequent falls due to her dementia. A cane, walker, or crutch will not resolve issues with performing activities of daily living. A wheelchair and shower chair will allow patient to safely perform all daily activities. Patient has a caregiver who can safely assist with propelling the wheelchair in the home.  -- Placed DME order for wheelchair and shower chair

## 2016-11-07 NOTE — Patient Instructions (Signed)
Amanda Castillo,  It was a pleasure seeing you today. I have placed the orders for your wheelchair and shower chair. You will be contacted by the medical supply company and it will get delivered to your house. Please schedule a follow up appointment with your primary care doctor in the next 3 months or sooner if you need [place]. If you have any questions or concerns, call our clinic at [phone] or after hours call [phone] and ask for the internal medicine resident on call. Thank you!   - Dr. Philipp Ovens

## 2016-11-08 NOTE — Progress Notes (Signed)
Internal Medicine Clinic Attending  Case discussed with Dr. Guilloud at the time of the visit.  We reviewed the resident's history and exam and pertinent patient test results.  I agree with the assessment, diagnosis, and plan of care documented in the resident's note.  

## 2016-11-21 ENCOUNTER — Encounter: Payer: Self-pay | Admitting: Psychology

## 2016-11-22 ENCOUNTER — Encounter: Payer: Self-pay | Admitting: Obstetrics & Gynecology

## 2016-11-22 ENCOUNTER — Encounter: Payer: Self-pay | Admitting: *Deleted

## 2016-11-25 ENCOUNTER — Ambulatory Visit (INDEPENDENT_AMBULATORY_CARE_PROVIDER_SITE_OTHER): Payer: Medicare Other | Admitting: Obstetrics & Gynecology

## 2016-11-25 ENCOUNTER — Encounter: Payer: Self-pay | Admitting: Obstetrics & Gynecology

## 2016-11-25 VITALS — BP 124/61 | HR 74 | Ht <= 58 in | Wt 140.0 lb

## 2016-11-25 DIAGNOSIS — Z01419 Encounter for gynecological examination (general) (routine) without abnormal findings: Secondary | ICD-10-CM | POA: Diagnosis not present

## 2016-11-25 DIAGNOSIS — Z0000 Encounter for general adult medical examination without abnormal findings: Secondary | ICD-10-CM

## 2016-11-25 NOTE — Progress Notes (Signed)
   Subjective:    Patient ID: Amanda Castillo, female    DOB: [birth year], 61 y.o.   MRN: [id number]  HPI 61 yo SWG0 with Down's here for a pap. According to her caregiver, another doctor tried to do a pap and it did not go well.   Review of Systems     Objective:   Physical Exam WNWHmentally challenged Uses a walker  I was able to get as far as having her take her bottoms off, but she would not get her feet into the stirrups and would not allow inspection of her vuvla       Assessment & Plan:  Preventative care in a mentally challenged woman I will plan to do an EUA and pap smear in the OR

## 2016-11-26 ENCOUNTER — Encounter (HOSPITAL_COMMUNITY): Payer: Self-pay

## 2016-12-04 ENCOUNTER — Encounter: Payer: Self-pay | Admitting: Student in an Organized Health Care Education/Training Program

## 2016-12-19 ENCOUNTER — Encounter: Payer: Self-pay | Admitting: Psychology

## 2017-01-09 ENCOUNTER — Ambulatory Visit (INDEPENDENT_AMBULATORY_CARE_PROVIDER_SITE_OTHER): Payer: Medicare Other | Admitting: Psychology

## 2017-01-09 ENCOUNTER — Encounter: Payer: Self-pay | Admitting: Psychology

## 2017-01-09 DIAGNOSIS — F0281 Dementia in other diseases classified elsewhere with behavioral disturbance: Secondary | ICD-10-CM

## 2017-01-09 DIAGNOSIS — F02818 Dementia in other diseases classified elsewhere, unspecified severity, with other behavioral disturbance: Secondary | ICD-10-CM

## 2017-01-09 DIAGNOSIS — Q909 Down syndrome, unspecified: Secondary | ICD-10-CM
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
## 2017-01-09 NOTE — Progress Notes (Signed)
   Neuropsychology Note  Amanda Castillo came in today for 1 hour of neuropsychological testing with technician, Milana Kidney, BS, under the supervision of Dr. Macarthur Critchley. The patient did not appear overtly distressed by the testing session, per behavioral observation or via self-report to the technician. Rest breaks were offered. [name] will return within 2 weeks for a feedback session with Dr. Si Raider at which time her test performances, clinical impressions and treatment recommendations will be reviewed in detail. The patient understands she can contact our office should she require our assistance before this time.  Full report to follow.

## 2017-01-27 NOTE — Patient Instructions (Signed)
Your procedure is scheduled on:  Tuesday, Aug. 28, 2018  Enter through the [employer] [facility] at:  9:45 AM  Pick up the phone at the desk and dial [phone].  Call this number if you have problems the morning of surgery: [phone].  Remember: Do NOT eat food or drink after:  Midnight Monday  Take these medicines the morning of surgery with a SIP OF WATER:  Levothyroxine, Colace, Claritin  Stop ALL herbal medications at this time  Do NOT smoke the day of surgery.  Do NOT wear jewelry (body piercing), metal hair clips/bobby pins, make-up, artifical eyelashes or nail polish. Do NOT wear lotions, powders, or perfumes.  You may wear deodorant. Do NOT shave for 48 hours prior to surgery. Do NOT bring valuables to the hospital. Contacts, dentures, or bridgework may not be worn into surgery.  Have a responsible adult drive you home and stay with you for 24 hours after your procedure  Bring a copy of your healthcare power of attorney and living will documents.

## 2017-01-28 ENCOUNTER — Encounter (HOSPITAL_COMMUNITY): Payer: Self-pay

## 2017-01-28 ENCOUNTER — Encounter (HOSPITAL_COMMUNITY)
Admission: RE | Admit: 2017-01-28 | Discharge: 2017-01-28 | Disposition: A | Discharge status: Home / Self Care | Payer: Medicare Other | Source: Ambulatory Visit | Attending: Obstetrics & Gynecology | Admitting: Obstetrics & Gynecology

## 2017-01-28 DIAGNOSIS — Z0000 Encounter for general adult medical examination without abnormal findings: Secondary | ICD-10-CM | POA: Insufficient documentation

## 2017-01-28 DIAGNOSIS — Q909 Down syndrome, unspecified: Secondary | ICD-10-CM | POA: Insufficient documentation

## 2017-01-28 DIAGNOSIS — Z01818 Encounter for other preprocedural examination: Secondary | ICD-10-CM | POA: Insufficient documentation

## 2017-01-28 NOTE — Pre-Procedure Instructions (Signed)
No need to draw labs on this patient per Dr. Tobias Alexander.

## 2017-02-04 ENCOUNTER — Ambulatory Visit (HOSPITAL_COMMUNITY): Payer: Medicare Other | Admitting: Certified Registered Nurse Anesthetist

## 2017-02-04 ENCOUNTER — Encounter (HOSPITAL_COMMUNITY)
Admission: RE | Disposition: A | Discharge status: Home / Self Care | Payer: Self-pay | Source: Ambulatory Visit | Attending: Obstetrics & Gynecology

## 2017-02-04 ENCOUNTER — Encounter (HOSPITAL_COMMUNITY): Payer: Self-pay | Admitting: Certified Registered Nurse Anesthetist

## 2017-02-04 ENCOUNTER — Ambulatory Visit (HOSPITAL_COMMUNITY)
Admission: RE | Admit: 2017-02-04 | Discharge: 2017-02-04 | Disposition: A | Discharge status: Home / Self Care | Payer: Medicare Other | Source: Ambulatory Visit | Attending: Obstetrics & Gynecology | Admitting: Obstetrics & Gynecology

## 2017-02-04 DIAGNOSIS — G4730 Sleep apnea, unspecified: Secondary | ICD-10-CM | POA: Diagnosis not present

## 2017-02-04 DIAGNOSIS — E785 Hyperlipidemia, unspecified: Secondary | ICD-10-CM | POA: Diagnosis not present

## 2017-02-04 DIAGNOSIS — Z124 Encounter for screening for malignant neoplasm of cervix: Secondary | ICD-10-CM | POA: Diagnosis not present

## 2017-02-04 DIAGNOSIS — Z7982 Long term (current) use of aspirin: Secondary | ICD-10-CM | POA: Insufficient documentation

## 2017-02-04 DIAGNOSIS — Z8673 Personal history of transient ischemic attack (TIA), and cerebral infarction without residual deficits: Secondary | ICD-10-CM | POA: Diagnosis not present

## 2017-02-04 DIAGNOSIS — E039 Hypothyroidism, unspecified: Secondary | ICD-10-CM | POA: Diagnosis not present

## 2017-02-04 DIAGNOSIS — F329 Major depressive disorder, single episode, unspecified: Secondary | ICD-10-CM | POA: Insufficient documentation

## 2017-02-04 DIAGNOSIS — Z79899 Other long term (current) drug therapy: Secondary | ICD-10-CM | POA: Insufficient documentation

## 2017-02-04 DIAGNOSIS — I739 Peripheral vascular disease, unspecified: Secondary | ICD-10-CM | POA: Insufficient documentation

## 2017-02-04 DIAGNOSIS — Q909 Down syndrome, unspecified: Secondary | ICD-10-CM

## 2017-02-04 SURGERY — EXAM UNDER ANESTHESIA
Anesthesia: Monitor Anesthesia Care | Site: Vagina

## 2017-02-04 MED ORDER — DEXAMETHASONE SODIUM PHOSPHATE 4 MG/ML IJ SOLN
INTRAMUSCULAR | Status: AC
Start: 2017-02-04 — End: 2017-02-04
  Filled 2017-02-04: qty 1

## 2017-02-04 MED ORDER — MEPERIDINE HCL 25 MG/ML IJ SOLN: 6.2500 mg | INTRAMUSCULAR | Status: DC | PRN

## 2017-02-04 MED ORDER — OXYCODONE HCL 5 MG PO TABS: 5.0000 mg | ORAL_TABLET | Freq: Once | ORAL | Status: DC | PRN

## 2017-02-04 MED ORDER — FENTANYL CITRATE (PF) 100 MCG/2ML IJ SOLN
INTRAMUSCULAR | Status: AC
  Filled 2017-02-04: qty 2

## 2017-02-04 MED ORDER — LIDOCAINE HCL (CARDIAC) 20 MG/ML IV SOLN
INTRAVENOUS | Status: DC | PRN
Start: 2017-02-04 — End: 2017-02-04
  Administered 2017-02-04: 50 mg via INTRAVENOUS

## 2017-02-04 MED ORDER — MIDAZOLAM HCL 2 MG/2ML IJ SOLN
INTRAMUSCULAR | Status: DC | PRN
  Administered 2017-02-04: 1 mg via INTRAVENOUS

## 2017-02-04 MED ORDER — HYDROMORPHONE HCL 1 MG/ML IJ SOLN: 0.2500 mg | INTRAMUSCULAR | Status: DC | PRN

## 2017-02-04 MED ORDER — DEXAMETHASONE SODIUM PHOSPHATE 10 MG/ML IJ SOLN
INTRAMUSCULAR | Status: DC | PRN
  Administered 2017-02-04: 4 mg via INTRAVENOUS

## 2017-02-04 MED ORDER — OXYCODONE HCL 5 MG/5ML PO SOLN: 5.0000 mg | Freq: Once | ORAL | Status: DC | PRN

## 2017-02-04 MED ORDER — PROMETHAZINE HCL 25 MG/ML IJ SOLN: 6.2500 mg | INTRAMUSCULAR | Status: DC | PRN

## 2017-02-04 MED ORDER — ONDANSETRON HCL 4 MG/2ML IJ SOLN
INTRAMUSCULAR | Status: DC | PRN
  Administered 2017-02-04: 4 mg via INTRAVENOUS

## 2017-02-04 MED ORDER — MIDAZOLAM HCL 2 MG/2ML IJ SOLN
INTRAMUSCULAR | Status: AC
  Filled 2017-02-04: qty 2

## 2017-02-04 MED ORDER — FENTANYL CITRATE (PF) 100 MCG/2ML IJ SOLN
INTRAMUSCULAR | Status: DC | PRN
  Administered 2017-02-04: 50 ug via INTRAVENOUS

## 2017-02-04 MED ORDER — PROPOFOL 500 MG/50ML IV EMUL
INTRAVENOUS | Status: DC | PRN
  Administered 2017-02-04: 20 mg via INTRAVENOUS

## 2017-02-04 MED ORDER — ONDANSETRON HCL 4 MG/2ML IJ SOLN
INTRAMUSCULAR | Status: AC
  Filled 2017-02-04: qty 2

## 2017-02-04 MED ORDER — PROPOFOL 10 MG/ML IV BOLUS
INTRAVENOUS | Status: AC
  Filled 2017-02-04: qty 20

## 2017-02-04 MED ORDER — LACTATED RINGERS IV SOLN
INTRAVENOUS | Status: DC
  Administered 2017-02-04: 09:00:00 via INTRAVENOUS

## 2017-02-04 SURGICAL SUPPLY — 7 items
CLOTH BEACON ORANGE TIMEOUT ST (SAFETY) ×3 IMPLANT
COVER BACK TABLE 60X90IN (DRAPES) ×3 IMPLANT
GLOVE BIO SURGEON STRL SZ 6.5 (GLOVE) ×2 IMPLANT
GLOVE BIO SURGEONS STRL SZ 6.5 (GLOVE) ×1
GLOVE BIOGEL PI IND STRL 7.0 (GLOVE) ×1 IMPLANT
GLOVE BIOGEL PI INDICATOR 7.0 (GLOVE) ×2
GOWN STRL REUS W/TWL LRG LVL3 (GOWN DISPOSABLE) ×6 IMPLANT

## 2017-02-04 NOTE — Anesthesia Postprocedure Evaluation (Signed)
Anesthesia Post Note  Patient: Amanda Castillo  Procedure(s) Performed: Procedure(s) (LRB): EXAM UNDER ANESTHESIA, PAP SMEAR, AND BREAST EXAM PERFORMED (N/A)     Patient location during evaluation: PACU Anesthesia Type: MAC Level of consciousness: awake and alert Pain management: pain level controlled Vital Signs Assessment: post-procedure vital signs reviewed and stable Respiratory status: spontaneous breathing Cardiovascular status: stable Anesthetic complications: no    Last Vitals:  Vitals:   02/04/17 1015 02/04/17 1030  BP:    Pulse: (!) 44   Resp: 12   Temp:  (!) 36.4 C  SpO2: 97%     Last Pain:  Vitals:   02/04/17 1030  TempSrc:   PainSc: 0-No pain   Pain Goal: Patients Stated Pain Goal: 3 (02/04/17 3612)               Nolon Nations

## 2017-02-04 NOTE — Transfer of Care (Signed)
Immediate Anesthesia Transfer of Care Note  Patient: Amanda Castillo  Procedure(s) Performed: Procedure(s): EXAM UNDER ANESTHESIA, PAP SMEAR, AND BREAST EXAM PERFORMED (N/A)  Patient Location: PACU  Anesthesia Type:MAC  Level of Consciousness: awake and sedated  Airway & Oxygen Therapy: Patient Spontanous Breathing and Patient connected to nasal cannula oxygen  Post-op Assessment: Report given to RN and Post -op Vital signs reviewed and stable  Post vital signs: Reviewed and stable  Last Vitals:  Vitals:   02/04/17 0822  BP: 106/60  Pulse: (!) 51  Resp: 18  Temp: (!) 36.4 C  SpO2: 98%    Last Pain:  Vitals:   02/04/17 0822  TempSrc: Oral      Patients Stated Pain Goal: 3 (83/41/96 2229)  Complications: No apparent anesthesia complications

## 2017-02-04 NOTE — Op Note (Signed)
02/04/2017  9:50 AM  PATIENT:  Amanda Castillo  61 y.o. female  PRE-OPERATIVE DIAGNOSIS:  Down's Syndrome Screening for Cervical Cancer  POST-OPERATIVE DIAGNOSIS:  Down's Syndrome, screening for Cervical Cancer  PROCEDURE:  Procedure(s): EXAM UNDER ANESTHESIA, PAP SMEAR, AND BREAST EXAM PERFORMED (N/A)  SURGEON:  Surgeon(s) and Role:    * Addison Whidbee C, MD - Primary  ANESTHESIA:   IV sedation  EBL:  Total I/O In: 200 [I.V.:200] Out: 0   BLOOD ADMINISTERED:none  DRAINS: none   LOCAL MEDICATIONS USED:  NONE  SPECIMEN:  Source of Specimen:  thin prep pap smear  DISPOSITION OF SPECIMEN:  PATHOLOGY  COUNTS:  YES  TOURNIQUET:  * No tourniquets in log *  DICTATION: .Dragon Dictation  PLAN OF CARE: Discharge to home after PACU  PATIENT DISPOSITION:  PACU - hemodynamically stable.   Delay start of Pharmacological VTE agent (>24hrs) due to surgical blood loss or risk of bleeding: not applicable  She was taken to the OR and given IV sedation. A time out procedure was done. I did a breast exam and found no abnormalities. I examined her vulva. Her hymen was intact. I was able to placed my pinky inside her vagina and did a bimanual exam. No masses were palpable. I attempted to place the smallest pediatric speculum available. She began to writhe on the table and bring her legs together. I took the speculum apart and used half of it to open the vagina so that I could use a cytobrush and spatula to sample the cervix. This was not done visually, but by tactile sensation. Once I had the specimen, she was awakened and taken to the recovery room.

## 2017-02-04 NOTE — Discharge Instructions (Addendum)

## 2017-02-04 NOTE — Anesthesia Preprocedure Evaluation (Signed)
Anesthesia Evaluation  Patient identified by MRN, date of birth, ID band Patient awake    Reviewed: Allergy & Precautions, NPO status , Patient's Chart, lab work & pertinent test results  Airway Mallampati: III  TM Distance: <3 FB Neck ROM: Full    Dental   Pulmonary sleep apnea ,    Pulmonary exam normal breath sounds clear to auscultation       Cardiovascular + Peripheral Vascular Disease  Normal cardiovascular exam Rhythm:Regular Rate:Normal     Neuro/Psych PSYCHIATRIC DISORDERS Depression CVA    GI/Hepatic negative GI ROS, Neg liver ROS,   Endo/Other  Hypothyroidism   Renal/GU negative Renal ROS     Musculoskeletal negative musculoskeletal ROS (+)   Abdominal   Peds  Hematology negative hematology ROS (+)   Anesthesia Other Findings   Reproductive/Obstetrics negative OB ROS                             Anesthesia Physical Anesthesia Plan  ASA: III  Anesthesia Plan: MAC   Post-op Pain Management:    Induction: Intravenous  PONV Risk Score and Plan: 2 and Ondansetron and Dexamethasone  Airway Management Planned:   Additional Equipment:   Intra-op Plan:   Post-operative Plan:   Informed Consent: I have reviewed the patients History and Physical, chart, labs and discussed the procedure including the risks, benefits and alternatives for the proposed anesthesia with the patient or authorized representative who has indicated his/her understanding and acceptance.   Dental advisory given  Plan Discussed with: CRNA  Anesthesia Plan Comments:         Anesthesia Quick Evaluation

## 2017-02-04 NOTE — H&P (Signed)
Amanda Castillo is an 61 y.o. female. She is mentally challenged and is due for a pap smear. It is reported to me that she is not sexually active and does not have post menopausal bleeding.  No LMP recorded. Patient is postmenopausal.    Past Medical History:  Diagnosis Date  . Allergy   . Depression   . Down's syndrome   . Hallucinations   . Hyperlipidemia   . Hypothyroidism   . Moderate intellectual disability   . Orthostatic hypotension 06/06/2015  . PVD (peripheral vascular disease) ([place])   . Sleep apnea   . Stroke [facility])    in 2006    Past Surgical History:  Procedure Laterality Date  . COLONOSCOPY  06/12/2012  . orif lt foot    . TONSILLECTOMY      Family History  Problem Relation Age of Onset  . Colon cancer Paternal Uncle   . Colon cancer Maternal Grandfather     Social History:  reports that she has never smoked. She has never used smokeless tobacco. She reports that she does not drink alcohol or use drugs.  Allergies: No Known Allergies  Prescriptions Prior to Admission  Medication Sig Dispense Refill Last Dose  . aspirin 81 MG tablet Take 81 mg by mouth daily.   02/03/2017 at Unknown time  . cholecalciferol 1000 units tablet Take 1 tablet (1,000 Units total) by mouth daily. 90 tablet 3 02/03/2017 at Unknown time  . docusate sodium (COLACE) 100 MG capsule Take 1 capsule (100 mg total) by mouth 2 (two) times daily. 60 capsule  02/04/2017 at Unknown time  . Doxepin HCl (SILENOR) 6 MG TABS Take 6 mg by mouth at bedtime.   02/03/2017 at Unknown time  . feeding supplement, ENSURE ENLIVE, (ENSURE ENLIVE) LIQD Take 237 mLs by mouth 2 (two) times daily between meals. (Patient taking differently: Take 237 mLs by mouth 2 (two) times daily as needed. ) 237 mL 12 Past Week at Unknown time  . Iloperidone (FANAPT) 6 MG TABS Take 6 mg by mouth at bedtime.   02/03/2017 at Unknown time  . levothyroxine (SYNTHROID, LEVOTHROID) 125 MCG tablet Take 0.5 tablets (62.5 mcg total) by mouth  daily before breakfast. 90 tablet 3 02/04/2017 at Unknown time  . loratadine (CLARITIN) 10 MG tablet Take 1 tablet (10 mg total) by mouth daily. 90 tablet 1 02/04/2017 at Unknown time  . Propylene Glycol (SYSTANE BALANCE OP) Apply 1 drop to eye 2 (two) times daily.    02/03/2017 at Unknown time  . simvastatin (ZOCOR) 40 MG tablet Take 1 tablet (40 mg total) by mouth at bedtime. At bedtime 90 tablet 3 02/03/2017 at Unknown time  . sodium chloride (OCEAN) 0.65 % SOLN nasal spray Place 1 spray into both nostrils at bedtime as needed for congestion.    02/03/2017 at Unknown time  . terbinafine (LAMISIL) 1 % cream Apply 1 application topically 2 (two) times daily.   Past Week at Unknown time  . nystatin (NYSTATIN) powder Apply topically 4 (four) times daily. (Patient not taking: Reported on 11/25/2016) 15 g 0 Not Taking    ROS  Blood pressure 106/60, pulse (!) 51, temperature (!) 97.5 F (36.4 C), temperature source Oral, resp. rate 18, SpO2 98 %. Physical Exam  Well nourished, well hydrated white female, no apparent distress She generally uses a walker. Heart- rrr Lungs- CTAB   No results found for this or any previous visit (from the past 24 hour(s)).  No results found.  Assessment/Plan: Preventative care- I will do a breast and pelvic exam with pap smear under anesthesia.  Emily Filbert 02/04/2017, 9:03 AM

## 2017-02-05 ENCOUNTER — Telehealth: Payer: Self-pay | Admitting: Psychology

## 2017-02-05 LAB — CYTOLOGY - PAP: DIAGNOSIS: NEGATIVE

## 2017-02-05 NOTE — Progress Notes (Signed)
NEUROPSYCHOLOGICAL EVALUATION   Name:    Amanda Castillo  Date of Birth:   November 15, 1955 Date of Interview:  01/09/2017 Date of Testing:  01/09/2017   Date of Feedback:  02/06/2017       Background Information:  Reason for Referral:  Amanda Castillo is a 61 y.o. female referred by Dr. Ellouise Castillo to assess her current level of cognitive functioning and assist in differential diagnosis. The current evaluation consisted of a review of available medical records, an interview with the patient and her caregiver, Amanda Castillo, and the completion of a neuropsychological testing battery. Informed assent was obtained from the patient, and informed consent was obtained from the patient's caregiver.  History of Presenting Problem:  This patient's history is significant for Down syndrome with moderate intellectual disability and left frontal stroke in 2003 (reportedly no residual deficits). Amanda Castillo has been followed by Dr. Delice Castillo since late 2015. She was originally seen because she was having episodes of loss of consciousness and there was concern for seizures. She had multiple EEGs which did not show any clear evidence of epilepsy. Dr. Delice Castillo feels these are likely vasovagal syncopal episodes. There has also been more recent concern about declining functioning and behavioral changes. She was referred to Dr. Casimiro Castillo for psychiatric evaluation and saw him on 07/26/2015. He felt the patient was most likely experiencing progressive Alzheimer's disease which is common in individuals with Down syndrome in their 26s and 27s. She was referred to Amanda Castillo at [facility] since she has some expertise in this patient population, and the patient continues to see Amanda Castillo. The patient's caregiver reports this has been helpful.  At today's visit, the patient is accompanied by her caregiver, Amanda Castillo. The patient has lived with Amanda Castillo and Amanda Castillo's husband for about 2 1/2 years but has known Amanda Castillo for about 19 years.  Amanda Castillo's sister, Amanda Castillo, is her legal guardian. When Amanda Castillo came to live with Amanda Castillo, there was a diagnosis of dementia in her records, but the patient's sister felt this could not be diagnosed if she had never been formally assessed. As such, she was referred for the present evaluation.  The patient had relatively stable functioning for most of her life, but she started demonstrating functional decline while in her previous group home. Since moving in with Amanda Castillo, there has been continued functional decline as well as increased behavioral disturbance. When she moved in with Amanda Castillo, she was able to shower and dress herself and ambulate independently. She can now no longer shower independently (she now has fear associated with it and requires two adults to assist her), dress herself even when cued, or walk any distance greater than a few steps (she refuses to walk and states her legs hurt). She gets confused at meal times, thinking she should get ice cream after breakfast (she has always gotten it after dinner only), she is disoriented in the home and has to be told where the bathroom is on a daily basis (they did move to this home from another condo, but she had no trouble adapting to new places in the past), she thought it was her birthday every day because there was a happy birthday balloon in the house, she cannot recognize faces of people she has met more recently (every day asking who someone is), she also has less recognition for faces of people she has known for a long time (e.g., a family friend who she has known for years and used to recognize), and she  no longer enjoys activities she used to enjoy like watching Barney and coloring. She would now prefer to stay in bed all day. She has to be forced to get out of bed and eat. She sometimes refuses to eat. She is drinking Ensure. She does take her medications when they are administered to her. She was previously staying up all night, yelling for  Amanda Castillo but with medication from Amanda Castillo she is now sleeping better.  Lately she has been refusing to do a lot of things, even at the day program, which is very different for her. She also has been engaging in bullying behavior at her day program which is very atypical and has never been an issue in the past. She seems to mimic other members in her day program which she did not used to do. The more she is around people in wheelchairs the more she insists on staying in a wheelchair and being unable to walk. She does some behaviors that seem to be for attention, including spitting at the dinner table and making herself vomit.  It appears she is also having auditory hallucinations. In her bedroom, she appears to be talking to someone who is not there, and sometimes becomes upset and yells seemingly at the person. It is unclear if there are visual hallucinations.  She continues to talk to her sister/guardian on the phone regularly but she is not seeing her sister as often due to Amanda Castillo behavioral problems when she is out with her sister (e.g., refusing to go into a restaurant).   The patient has some incontinence but it is infrequent. When she is incontinent of urine it is because she refuses to go to the restroom all day and then she experiences some leaking and/or urgency. Yesterday she refused to go to the bathroom all day until the evening.  Amanda Castillo does not see evidence of depressed mood. In speaking with her today, the patient herself states she is not sad and instead describes herself as happy.  Amanda Castillo has become physically aggressive (beating on the dashboard in the car) when she is unhappy but she has NOT tried to physically harm any person.    Social History: Born/Raised: [place]. She used to live with her mother, then group homes, now with Amanda Castillo. She attends a day program. Education: Public school, certificate of completion Alcohol: None Tobacco: None   Medical History:    Past Medical History:  Diagnosis Date  . Allergy   . Depression   . Down's syndrome   . Hallucinations   . Hyperlipidemia   . Hypothyroidism   . Moderate intellectual disability   . Orthostatic hypotension 06/06/2015  . PVD (peripheral vascular disease) ([place])   . Sleep apnea   . Stroke [facility])    in 2006    Current medications:  Outpatient Encounter Prescriptions as of 02/06/2017  Medication Sig  . aspirin 81 MG tablet Take 81 mg by mouth daily.  . cholecalciferol 1000 units tablet Take 1 tablet (1,000 Units total) by mouth daily.  Marland Kitchen docusate sodium (COLACE) 100 MG capsule Take 1 capsule (100 mg total) by mouth 2 (two) times daily.  . Doxepin HCl (SILENOR) 6 MG TABS Take 6 mg by mouth at bedtime.  . feeding supplement, ENSURE ENLIVE, (ENSURE ENLIVE) LIQD Take 237 mLs by mouth 2 (two) times daily between meals. (Patient taking differently: Take 237 mLs by mouth 2 (two) times daily as needed. )  . Iloperidone (FANAPT) 6 MG TABS Take 6 mg by mouth  at bedtime.  Marland Kitchen levothyroxine (SYNTHROID, LEVOTHROID) 125 MCG tablet Take 0.5 tablets (62.5 mcg total) by mouth daily before breakfast.  . loratadine (CLARITIN) 10 MG tablet Take 1 tablet (10 mg total) by mouth daily.  Marland Kitchen nystatin (NYSTATIN) powder Apply topically 4 (four) times daily. (Patient not taking: Reported on 11/25/2016)  . Propylene Glycol (SYSTANE BALANCE OP) Apply 1 drop to eye 2 (two) times daily.   . simvastatin (ZOCOR) 40 MG tablet Take 1 tablet (40 mg total) by mouth at bedtime. At bedtime  . sodium chloride (OCEAN) 0.65 % SOLN nasal spray Place 1 spray into both nostrils at bedtime as needed for congestion.   . terbinafine (LAMISIL) 1 % cream Apply 1 application topically 2 (two) times daily.   No facility-administered encounter medications on file as of 02/06/2017.      Current Examination:  Behavioral Observations:   Appearance: Casually and appropriately dressed and groomed Gait: Seated in a wheelchair. Upon request, she  stood up from her wheelchair (very slowly and cautiously) but did not take any steps Speech: Dysarthric, dysfluent, frequently using 1-2 word phrases to communicate. Frequent repetition. Does engage and try to answer questions posed to her in clinical interview. Affect: Generally bright/euthymic during the clinical interview. Interpersonal: Pleasant, smiles and says "thank you" Orientation/Mental status: Patient knows her date of birth but not her current age. She is not oriented to any aspect of time. She is oriented to Castillo. She cannot name the current President or his immediate predecessor (reports "Shon Millet" for the latter).  She is able to name common objects such as "ink pen", "paper" and "telephone". She is able to follow simple one step commands.  Tests Administered: . Neuropsychological Assessment Battery (NAB) Language Module, Form 1: Naming Subtest . Controlled Oral Word Association Test (COWAT- Animals only) . Dementia Rating Scale-Second Edition (DRS-2)  Test Results: Note: Standardized scores are presented only for use by appropriately trained professionals and to allow for any future test-retest comparison. These scores should not be interpreted without consideration of all the information that is contained in the rest of the report. The most recent standardization samples from the test publisher or other sources were used whenever possible to derive standard scores; scores were corrected for age, gender, ethnicity and education when available.   Test Scores:  Test Name Raw Score Standardized Score Descriptor  DRS-2     Attention 25 ss= 2 Impaired  Initiation/Perseveration 15 ss= 2 Impaired  Construction 2 ss= 2 Impaired  Conceptualization 5 ss= 2 Impaired  Memory 1 ss= 2 Impaired  Total score 48 ss= 2 Impaired  Total score - Education Adjusted (AEMSS)  ss= 1 Impaired  NAB Naming 17/31 T= 19 Severely impaired  COWAT-Animals 7 T= 20 Severely impaired      Description  of Test Results:  Ms. Palinkas demonstrated global cognitive impairment. She was administered a dementia screening battery (DRS-2). On this omnibus measure, she performed in the severely impaired range overall. She performed within the impaired range on all aspects of the measure, including attention, initiation/perseveration, construction, conceptualization, and memory. With regard to attentional capacity, she was able to repeat up to 4 digits forward but she could not repeat even 2 digits backwards. She could perform one-step commands but had more difficulty with 2-step commands. She was able to locate and count specific letters among competing stimuli. She was not able to match identical pairs of more abstract visual stimuli. She was not able to read single words. With regard to  visual-spatial construction abilities, she could not reproduce shapes like diamonds and she could not reproduce more complex geometric figures such as one shape inside another. She could not write her name (she printed "cerj"). With respect to semantic retrieval/language abilities, she demonstrated severely impaired confrontation naming overall, but she could name common items such as comb, lion, umbrella. Semantic verbal fluency was impaired (5 supermarket items in a minute, 7 animals in a minute). With regard to memory, she was noted to be disoriented to all aspects of time. She also demonstrated severely impaired immediate recall for both visual and verbal information. Not surprisingly then, delayed recall was also severely impaired with no carryover of previously displayed information. With respect to executive functioning, she could not perform double-alternating movements. She could not abstract similarities among items (e.g., could not tell how an apple and a banana are alike) but also could not engage in priming inductive reasoning.    Clinical Impressions: Moderate dementia with behavioral disturbance, most likely due to  Alzheimer's disease, superimposed on Down syndrome and moderate intellectual disability. While we would certainly expect many areas of attenuated performances on neuropsychological testing in a patient with Down syndrome and moderate intellectual disability, current test results suggest more global cognitive decline and thus additional superimposed neurologic process that is causing deficits beyond what is typical in even in these conditions. This is consistent with what her caregiver has noticed in terms of reduced cognitive and functional abilities relative to her baseline.  In addition to global cognitive impairments, early-onset dementia in this population is marked by personality and behavioral changes including: irritability, oppositional behavior, apathy, and decreased social interaction.  Consistent with the literature, personality and behavioral changes are evident in this patient as well. Integrating historical/medical information, collateral data, adaptive abilities, and the cognitive testing results, findings are most consistent with cognitive dysfunction secondary to AD along with her previous existing conditions.    Recommendations/Plan: Based on the findings of the present evaluation, the following recommendations are offered:  1. The patient is receiving the appropriate level of care at this time (24 hour supervision, assistance with all tasks, day program 5 days a week). She will likely need increasing care with basic ADLs as her dementia progresses. Options aside from her current situation, in the future, would be a memory care facility.  2. She and her caregiver/family should continue to follow up with psychiatry/Amanda Castillo for optimal management of her neuropsychiatric symptoms.   Feedback to Patient: Amanda Castillo' caregiver, Amanda Castillo, returned for a feedback appointment on 02/06/2017 to review the results of her neuropsychological evaluation with this provider. 15 minutes  face-to-face time was spent reviewing her test results, my impressions and my recommendations as detailed above.    Total time spent on this patient's case: 90791x1 unit for interview with psychologist; [phone] units of testing by psychometrician under psychologist's supervision; [phone] units for medical record review, scoring of neuropsychological tests, interpretation of test results, preparation of this report, and review of results to the patient by psychologist.      Thank you for your referral of Amanda Castillo. Please feel free to contact me if you have any questions or concerns regarding this report.

## 2017-02-05 NOTE — Telephone Encounter (Signed)
[name] can come to the appointment without Malachy Mood. Thanks!

## 2017-02-05 NOTE — Telephone Encounter (Signed)
Okay thank you. I called and told [name] that would be fine. Thanks

## 2017-02-05 NOTE — Telephone Encounter (Signed)
[name] called regarding Amanda Castillo's appointment for tomorrow 02/06/17 at 10:00 AM. She needed to know if she could just come to the appointment or did Earnie need to come also? Please Advise. Thanks

## 2017-02-06 ENCOUNTER — Ambulatory Visit (INDEPENDENT_AMBULATORY_CARE_PROVIDER_SITE_OTHER): Payer: Medicare Other | Admitting: Psychology

## 2017-02-06 ENCOUNTER — Encounter: Payer: Self-pay | Admitting: Psychology

## 2017-02-06 DIAGNOSIS — F0281 Dementia in other diseases classified elsewhere with behavioral disturbance: Secondary | ICD-10-CM

## 2017-02-06 DIAGNOSIS — G300 Alzheimer's disease with early onset: Secondary | ICD-10-CM | POA: Diagnosis not present

## 2017-02-06 DIAGNOSIS — F79 Unspecified intellectual disabilities: Secondary | ICD-10-CM

## 2017-02-06 DIAGNOSIS — Q909 Down syndrome, unspecified: Secondary | ICD-10-CM

## 2017-02-06 DIAGNOSIS — F02818 Dementia in other diseases classified elsewhere, unspecified severity, with other behavioral disturbance: Secondary | ICD-10-CM

## 2017-02-12 ENCOUNTER — Telehealth: Payer: Self-pay | Admitting: Psychology

## 2017-02-12 NOTE — Telephone Encounter (Signed)
Hi Dr. Mauricio Po called regarding her Amanda Castillo. She had some questions regarding her recent diagnosis. She would like you to please call her at [phone].   Thanks [employer]

## 2017-02-13 NOTE — Telephone Encounter (Signed)
Called patient's sister/legal guardian, Mazella Deen, back today and spoke with her for 20 minutes. I answered her questions regarding diagnosis and recent neuropsychological testing to the best of my ability. She reported she is in "deep deep denial" about her sister having dementia, but she was open to listening to my clinical impressions. I also explained the increased incidence of AD in individuals with Down Syndrome, and the genetic link. She reported she was not aware of this. Thayer Headings also had specific questions regarding whether the patient should continue going to the day program 5 days a week. Thayer Headings thinks it is too much for her. I advised that, generally speaking, keeping a regular routine is helpful and important, but the patient's care team (sister, caregiver-[name], day program, psychiatry provider-Jo Ysidro Evert) should speak together about her particular case.

## 2017-02-25 DIAGNOSIS — Q909 Down syndrome, unspecified: Secondary | ICD-10-CM | POA: Diagnosis not present

## 2017-02-25 DIAGNOSIS — F0634 Mood disorder due to known physiological condition with mixed features: Secondary | ICD-10-CM | POA: Diagnosis not present

## 2017-02-27 ENCOUNTER — Other Ambulatory Visit: Payer: Self-pay | Admitting: *Deleted

## 2017-02-27 MED ORDER — LORATADINE 10 MG PO TABS: 10.0000 mg | ORAL_TABLET | Freq: Every day | ORAL | 3 refills | Status: DC

## 2017-03-04 DIAGNOSIS — F0634 Mood disorder due to known physiological condition with mixed features: Secondary | ICD-10-CM | POA: Diagnosis not present

## 2017-03-19 ENCOUNTER — Encounter: Payer: Self-pay | Admitting: Internal Medicine

## 2017-03-19 ENCOUNTER — Ambulatory Visit (INDEPENDENT_AMBULATORY_CARE_PROVIDER_SITE_OTHER): Payer: Medicare Other | Admitting: Internal Medicine

## 2017-03-19 VITALS — BP 112/74 | HR 76 | Temp 98.7°F | Wt 143.0 lb

## 2017-03-19 DIAGNOSIS — E2839 Other primary ovarian failure: Secondary | ICD-10-CM

## 2017-03-19 DIAGNOSIS — J309 Allergic rhinitis, unspecified: Secondary | ICD-10-CM

## 2017-03-19 DIAGNOSIS — E785 Hyperlipidemia, unspecified: Secondary | ICD-10-CM | POA: Diagnosis not present

## 2017-03-19 DIAGNOSIS — Z9181 History of falling: Secondary | ICD-10-CM | POA: Diagnosis not present

## 2017-03-19 DIAGNOSIS — E039 Hypothyroidism, unspecified: Secondary | ICD-10-CM

## 2017-03-19 DIAGNOSIS — E559 Vitamin D deficiency, unspecified: Secondary | ICD-10-CM | POA: Diagnosis not present

## 2017-03-19 DIAGNOSIS — B372 Candidiasis of skin and nail: Secondary | ICD-10-CM

## 2017-03-19 DIAGNOSIS — Q909 Down syndrome, unspecified: Secondary | ICD-10-CM | POA: Diagnosis not present

## 2017-03-19 DIAGNOSIS — F0281 Dementia in other diseases classified elsewhere with behavioral disturbance: Secondary | ICD-10-CM | POA: Diagnosis not present

## 2017-03-19 DIAGNOSIS — F02818 Dementia in other diseases classified elsewhere, unspecified severity, with other behavioral disturbance: Secondary | ICD-10-CM

## 2017-03-19 MED ORDER — SIMVASTATIN 40 MG PO TABS: 40.0000 mg | ORAL_TABLET | Freq: Every day | ORAL | 3 refills | Status: DC

## 2017-03-19 MED ORDER — LORATADINE 10 MG PO TABS: 10.0000 mg | ORAL_TABLET | Freq: Every day | ORAL | 3 refills | Status: DC

## 2017-03-19 MED ORDER — ASPIRIN 81 MG PO TABS: 81.0000 mg | ORAL_TABLET | Freq: Every day | ORAL | 5 refills | Status: DC

## 2017-03-19 MED ORDER — LEVOTHYROXINE SODIUM 125 MCG PO TABS: 62.5000 ug | ORAL_TABLET | Freq: Every day | ORAL | 1 refills | Status: DC

## 2017-03-19 MED ORDER — DOCUSATE SODIUM 100 MG PO CAPS: 100.0000 mg | ORAL_CAPSULE | Freq: Two times a day (BID) | ORAL | Status: DC

## 2017-03-19 MED ORDER — ENSURE ENLIVE PO LIQD: 237.0000 mL | Freq: Two times a day (BID) | ORAL | 12 refills | Status: DC

## 2017-03-19 NOTE — Assessment & Plan Note (Addendum)
Patient is a 61 year old female with a history of Down syndrome with associated dementia which impairs her ability to ambulate and perform activities of daily living including toileting, dressing, grooming, and bathing. She is dependent on her caregiver for assistance with all of her ADLs. She is also incontinent of urine, requiring diapers. She can ambulate short distances with a walker but otherwise requires a wheelchair. She also has difficulty with transferring and is prone to frequent falls, most recently last week while attempting to get out of her wheelchair. Caregiver is requesting a power lift to assist in the home, which I feel is appropriate.  -- Place DME order form power lift chair -- Place DME order for incontinent supplies

## 2017-03-19 NOTE — Assessment & Plan Note (Signed)
Follow-up DEXA.

## 2017-03-19 NOTE — Assessment & Plan Note (Signed)
Refilled Claritin 10 mg daily.

## 2017-03-19 NOTE — Progress Notes (Signed)
Internal Medicine Clinic Attending  Case discussed with Dr. Guilloud at the time of the visit.  We reviewed the resident's history and exam and pertinent patient test results.  I agree with the assessment, diagnosis, and plan of care documented in the resident's note.  

## 2017-03-19 NOTE — Assessment & Plan Note (Signed)
Refilled simvastatin 40 mg and aspirin 81 mg daily.

## 2017-03-19 NOTE — Progress Notes (Signed)
   CC: Fall, medication refill   HPI:  [name] is a 61 y.o. female with past medical history outlined below here for fall and medication refill. For the details of today's visit, please refer to the assessment and plan.  Past Medical History:  Diagnosis Date  . Allergy   . Depression   . Down's syndrome   . Hallucinations   . Hyperlipidemia   . Hypothyroidism   . Moderate intellectual disability   . Orthostatic hypotension 06/06/2015  . PVD (peripheral vascular disease) ([place])   . Sleep apnea   . Stroke [facility])    in 2006    Review of Systems  Musculoskeletal:       Right arm pain   Neurological: Negative for weakness.       Fall      Physical Exam:  Vitals:   03/19/17 0956  BP: 112/74  Pulse: 76  Temp: 98.7 F (37.1 C)  TempSrc: Oral  SpO2: 100%  Weight: 143 lb (64.9 kg)    Constitutional: NAD, appears comfortable, sitting in wheelchair  HEENT: Down syndrome facies  Cardiovascular: RRR, no murmurs, rubs, or gallops.  Pulmonary/Chest: CTAB, no wheezes, rales, or rhonchi.  Extremities: Warm and well perfused.  No edema. Right upper extremity mildly tender to palpation. ROM and strength intact. No focal tenderness.  Psychiatric: Normal mood and affect  Assessment & Plan:   See Encounters Tab for problem based charting.  Patient discussed with Amanda Castillo

## 2017-03-19 NOTE — Assessment & Plan Note (Addendum)
Patient has a history of hypothyroidism on 125 mcg daily. Last TSH checked on 02/28/2016 was 0.57.  -- Follow-up repeat TSH -- Refilled synthroid    ADDENDUM: TSH within normal range, 1.6. Called caregiver with results.

## 2017-03-19 NOTE — Assessment & Plan Note (Addendum)
Patient is on vitamin D supplements, 1,000 units daily. I do not see that she has ever had a vitamin D level checked.  -- F/u Vit D level   ADDENDUM: Vitamin D level is low-normal at 42. Will continue daily supplements for now. Continue to monitor.

## 2017-03-19 NOTE — Assessment & Plan Note (Addendum)
Caregiver reports a fall last week while patient was attempting to get out of her wheelchair. She denies any injury or trauma. Of note, patient was recently started on Ativan 1 mg daily prn by her psychiatrist. I discussed fall precautions with caregiver, advised limiting the Ativan or cutting the dose in half when possible.

## 2017-03-19 NOTE — Patient Instructions (Signed)
Ms. Rhatigan,  It was a pleasure to see you today. I will call you with the result of your blood work. I have also ordered a bone scan to check your bone density strength. You will be called to schedule this appointment. Please follow up with your primary care doctor in 6 months. If you have any questions or concerns, call our clinic at [phone] or after hours call [phone] and ask for the internal medicine resident on call. Thank you!  - Dr. Philipp Ovens

## 2017-03-20 ENCOUNTER — Telehealth: Payer: Self-pay | Admitting: Internal Medicine

## 2017-03-20 LAB — TSH: TSH: 1.62 u[IU]/mL (ref 0.450–4.500)

## 2017-03-20 LAB — VITAMIN D 25 HYDROXY (VIT D DEFICIENCY, FRACTURES): VIT D 25 HYDROXY: 42.4 ng/mL (ref 30.0–100.0)

## 2017-03-20 NOTE — Telephone Encounter (Signed)
Caregiver Amanda Castillo forgot to mention in yesterday's visit that the patient is wetting the bed and needed some incontinence Supplies for this patient.  Please advise if pt needs to come back in for this documentation for insurance purposes for a DME order to be placed.

## 2017-03-20 NOTE — Addendum Note (Signed)
Addended by: Jodean Lima on: 03/20/2017 03:03 PM   Modules accepted: Orders

## 2017-03-21 MED ORDER — CHOLECALCIFEROL 25 MCG (1000 UT) PO TABS: 1000.0000 [IU] | ORAL_TABLET | Freq: Every day | ORAL | 3 refills | Status: DC

## 2017-03-21 NOTE — Addendum Note (Signed)
Addended by: Jodean Lima on: 03/21/2017 11:30 AM   Modules accepted: Orders

## 2017-03-27 ENCOUNTER — Other Ambulatory Visit: Payer: Self-pay | Admitting: Internal Medicine

## 2017-03-27 DIAGNOSIS — E2839 Other primary ovarian failure: Secondary | ICD-10-CM

## 2017-04-10 ENCOUNTER — Ambulatory Visit (INDEPENDENT_AMBULATORY_CARE_PROVIDER_SITE_OTHER): Payer: Medicare Other | Admitting: Podiatry

## 2017-04-10 ENCOUNTER — Encounter: Payer: Self-pay | Admitting: Podiatry

## 2017-04-10 DIAGNOSIS — M2010 Hallux valgus (acquired), unspecified foot: Secondary | ICD-10-CM

## 2017-04-10 DIAGNOSIS — B351 Tinea unguium: Secondary | ICD-10-CM | POA: Diagnosis not present

## 2017-04-10 DIAGNOSIS — M79675 Pain in left toe(s): Secondary | ICD-10-CM

## 2017-04-10 DIAGNOSIS — M2040 Other hammer toe(s) (acquired), unspecified foot: Secondary | ICD-10-CM | POA: Diagnosis not present

## 2017-04-10 NOTE — Patient Instructions (Signed)
Seen for hypertrophic nails. All nails debrided. Return in 3 months or as needed.  

## 2017-04-10 NOTE — Progress Notes (Signed)
SUBJECTIVE: 61 y.o. year old female presents via wheel chair accompanied by her care taker requesting toe nails trimmed. She was seen Dr. Prudence Davidson last year.  Review of Systems  Constitutional: Negative.   HENT: Negative.   Respiratory: Negative.   Cardiovascular: Negative.   Gastrointestinal: Positive for constipation. Negative for diarrhea and heartburn.  Genitourinary: Negative.   Musculoskeletal: Positive for joint pain.  Neurological: Negative.   Had a stroke over 3-4 years ago and got weakened and got dependent on wheel chair.  OBJECTIVE: DERMATOLOGIC EXAMINATION: Nails: Thick yellow brittle nails x 10.  VASCULAR EXAMINATION OF LOWER LIMBS: All pedal pulses are palpable with normal pulsation.  No edema or erythema noted. Temperature gradient from tibial crest to dorsum of foot is within normal bilateral.  NEUROLOGIC EXAMINATION OF THE LOWER LIMBS: All epicritic and tactile sensations grossly intact. Normal response to sensory testing bilateral. Vibratory sensations(128Hz  turning fork) intact at medial and lateral forefoot bilateral.  Sharp and Dull discriminatory sensations at the plantar ball of hallux is intact bilateral.   MUSCULOSKELETAL EXAMINATION: Positive for severe hallux valgus with bunion right, laterally deviated 2nd digit and under ride to the 3rd toe right.  ASSESSMENT: Onychomycosis x 10. Severe forefoot deformity bilateral, HAV with bunion and hammer toes.  PLAN: Reviewed findings. All nails debrided. Return in 3 months or as needed.

## 2017-04-15 IMAGING — CT CT CERVICAL SPINE W/O CM
3 of 4 series · 13 of 33 positions shown, 16 images · non-contrast
Comparison: 02/22/2014 head CT.

CLINICAL DATA: Syncope with fall this morning. Headache. History of
CVA in 7554.

EXAM:
CT HEAD WITHOUT CONTRAST
CT CERVICAL SPINE WITHOUT CONTRAST
TECHNIQUE: Multidetector CT imaging of the head and cervical spine was
performed following the standard protocol without intravenous
contrast. Multiplanar CT image reconstructions of the cervical spine
were also generated.

[Series 3: c_spine 2.0 st · axial · 0.27mm/px · z∈[-286,-180]mm · 5 of 81 slices shown, 7 images]
[im 14/81  soft-tissue]
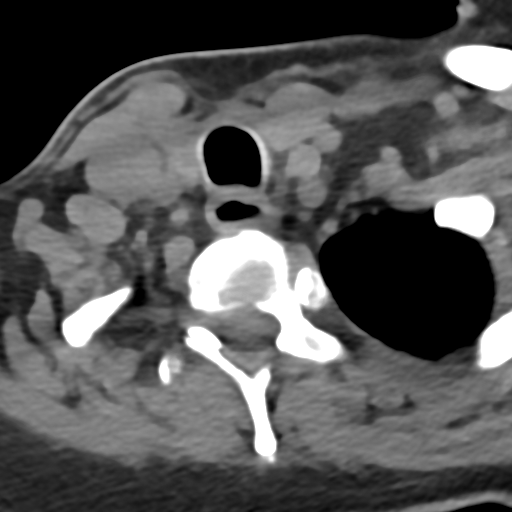
[im 14/81  bone]
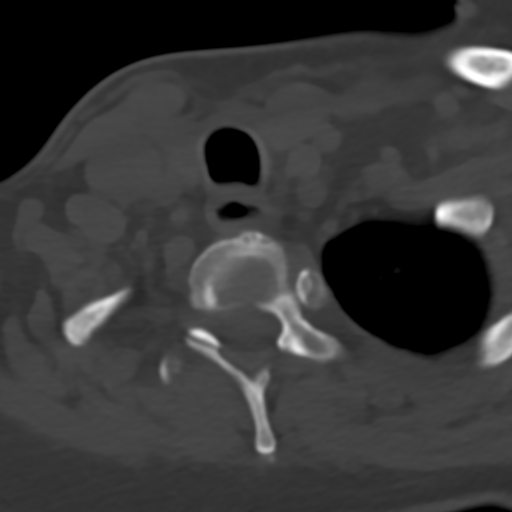
[im 27/81  bone]
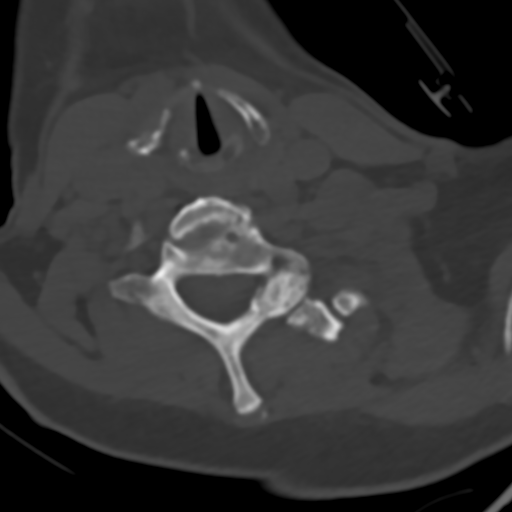
[im 41/81  bone]
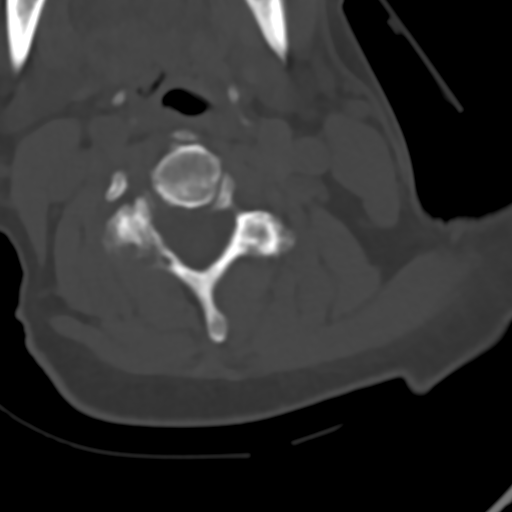
[im 54/81  bone]
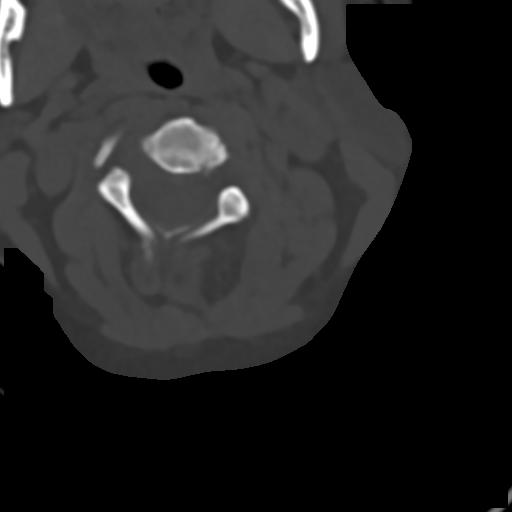
[im 67/81  soft-tissue]
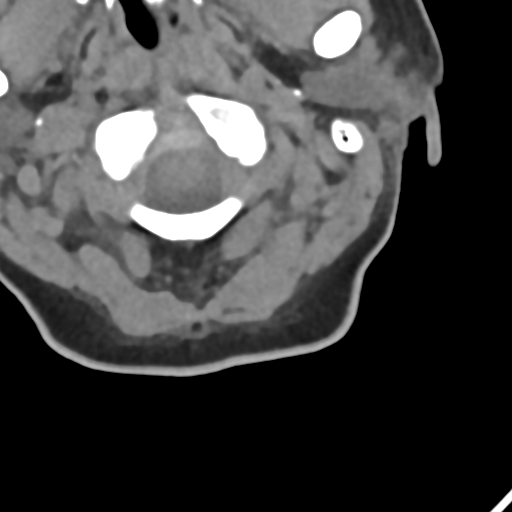
[im 67/81  bone]
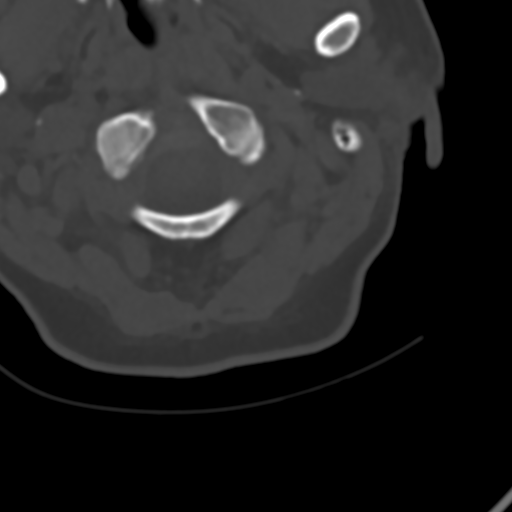

[Series 5: c_spine 2.0 sag bone · sagittal · 0.32mm/px · 5 of 65 slices shown, 6 images]
[im 22/65  bone]
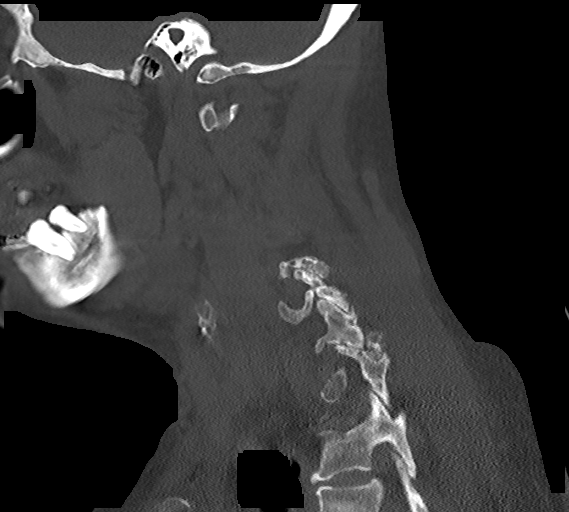
[im 27/65  bone]
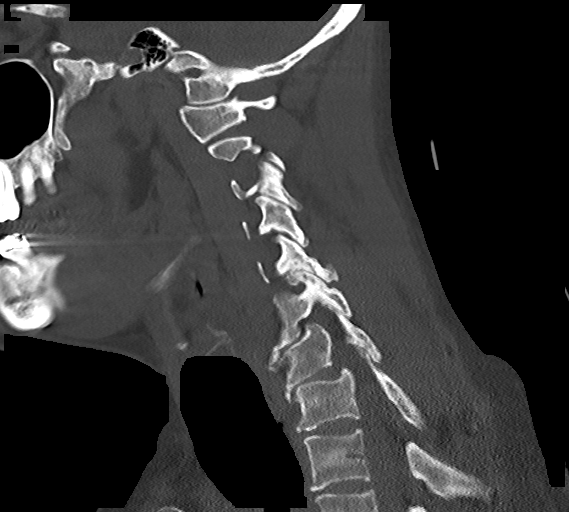
[im 33/65  soft-tissue]
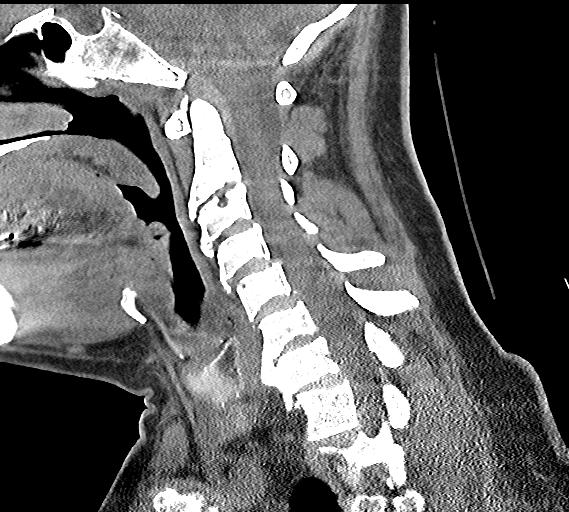
[im 33/65  bone]
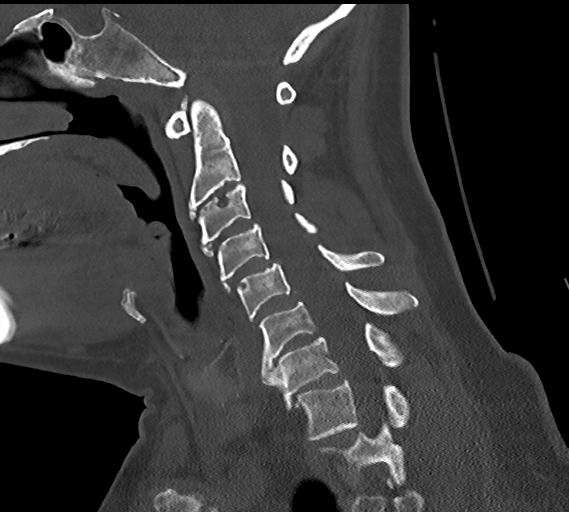
[im 38/65  bone]
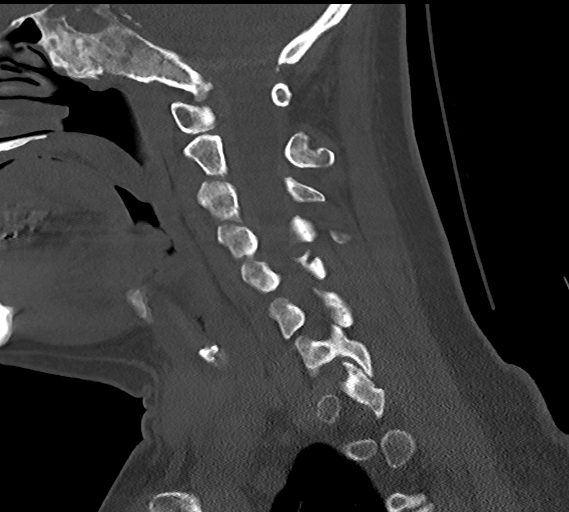
[im 43/65  bone]
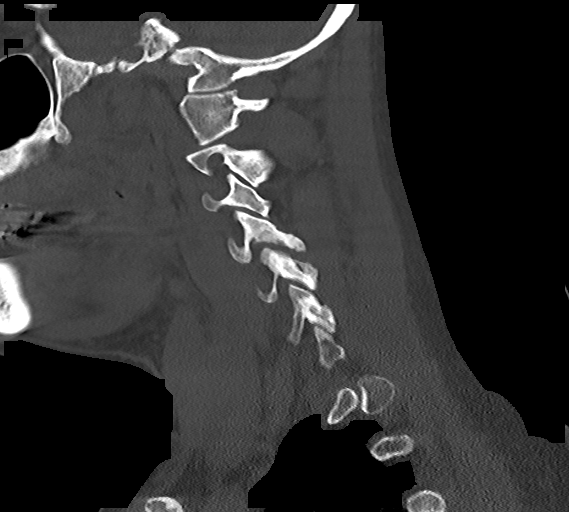

[Series 6: c_spine 2.0 cor bone · coronal · 0.24mm/px · 3 of 61 slices shown]
[im 13/61  bone]
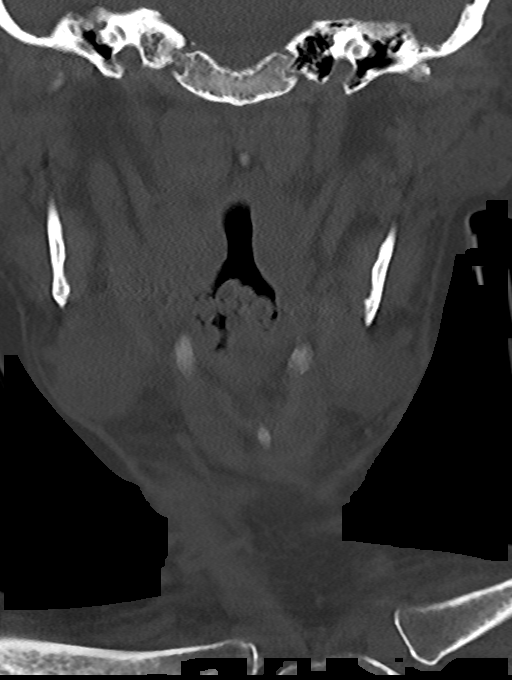
[im 25/61  bone]
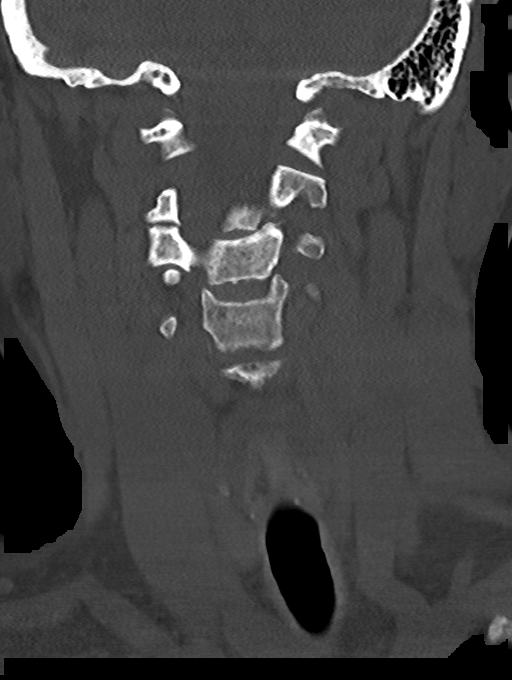
[im 37/61  bone]
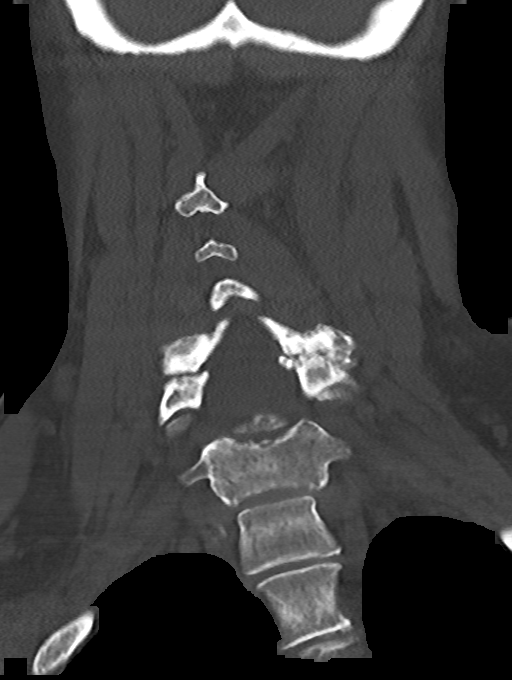

[13 of 33 positions shown; findings below may reference images not displayed]

FINDINGS: CT HEAD FINDINGS

No evidence of parenchymal hemorrhage or extra-axial fluid
collection. No mass lesion, mass effect, or midline shift.

No CT evidence of acute infarction. There is stable encephalomalacia
in the left frontal lobe, with mild ex vacuo dilatation of the
frontal horn of the left lateral ventricle, unchanged. There is
stable mild encephalomalacia in the right parietal lobe. Otherwise
no ventriculomegaly.

The visualized paranasal sinuses are essentially clear. The mastoid
air cells are unopacified, noting chronic under pneumatization of
the right mastoid air cells. No evidence of calvarial fracture.

CT CERVICAL SPINE FINDINGS

No fracture is detected in the cervical spine. No prevertebral soft
tissue swelling. There is straightening of the cervical spine,
usually due to positioning and/or muscle spasm. Dens is well
positioned between the lateral masses of C1. The lateral masses
appear well-aligned. There is moderate to severe degenerative disc
disease and bilateral facet arthropathy throughout the cervical
spine. There is 2 mm anterolisthesis at C3-4, C4-5, C5-6, C6-7 and
C7-T1, likely degenerative. There is mild-to-moderate foraminal
stenosis on the right at C4-5.

Visualized mastoid air cells appear clear. No evidence of
intra-axial hemorrhage in the visualized brain. No gross cervical
canal hematoma. Visualized lung apices are clear. No appreciable
cervical adenopathy or other cervical spine soft tissue abnormality.
IMPRESSION: 1. No acute intracranial abnormality.
2. Stable encephalomalacia in the left frontal and right parietal
lobes, from remote infarcts.
3. No calvarial or cervical spine fracture.
4. Moderate to severe degenerative disc disease and facet
arthropathy throughout the cervical spine. Minimal multilevel
anterolisthesis throughout the cervical spine, likely degenerative
in etiology.

## 2017-04-18 ENCOUNTER — Other Ambulatory Visit: Payer: Self-pay

## 2017-05-08 ENCOUNTER — Ambulatory Visit
Admission: RE | Admit: 2017-05-08 | Discharge: 2017-05-08 | Disposition: A | Discharge status: Home / Self Care | Payer: Medicare Other | Source: Ambulatory Visit | Attending: Student in an Organized Health Care Education/Training Program | Admitting: Student in an Organized Health Care Education/Training Program

## 2017-05-08 DIAGNOSIS — Z780 Asymptomatic menopausal state: Secondary | ICD-10-CM | POA: Diagnosis not present

## 2017-05-08 DIAGNOSIS — M85852 Other specified disorders of bone density and structure, left thigh: Secondary | ICD-10-CM | POA: Diagnosis not present

## 2017-05-08 DIAGNOSIS — E2839 Other primary ovarian failure: Secondary | ICD-10-CM

## 2017-06-23 ENCOUNTER — Other Ambulatory Visit: Payer: Self-pay

## 2017-06-23 ENCOUNTER — Encounter: Payer: Self-pay | Admitting: Internal Medicine

## 2017-06-23 ENCOUNTER — Ambulatory Visit (INDEPENDENT_AMBULATORY_CARE_PROVIDER_SITE_OTHER): Payer: Medicare Other | Admitting: Internal Medicine

## 2017-06-23 VITALS — BP 95/28 | HR 55 | Temp 97.5°F | Ht <= 58 in | Wt 142.1 lb

## 2017-06-23 DIAGNOSIS — F0281 Dementia in other diseases classified elsewhere with behavioral disturbance: Secondary | ICD-10-CM | POA: Diagnosis not present

## 2017-06-23 DIAGNOSIS — M8580 Other specified disorders of bone density and structure, unspecified site: Secondary | ICD-10-CM

## 2017-06-23 DIAGNOSIS — E039 Hypothyroidism, unspecified: Secondary | ICD-10-CM

## 2017-06-23 DIAGNOSIS — G4730 Sleep apnea, unspecified: Secondary | ICD-10-CM

## 2017-06-23 DIAGNOSIS — F02818 Dementia in other diseases classified elsewhere, unspecified severity, with other behavioral disturbance: Secondary | ICD-10-CM

## 2017-06-23 DIAGNOSIS — R32 Unspecified urinary incontinence: Secondary | ICD-10-CM | POA: Diagnosis not present

## 2017-06-23 DIAGNOSIS — Z993 Dependence on wheelchair: Secondary | ICD-10-CM | POA: Diagnosis not present

## 2017-06-23 DIAGNOSIS — Q909 Down syndrome, unspecified: Secondary | ICD-10-CM | POA: Diagnosis not present

## 2017-06-23 MED ORDER — ENSURE ENLIVE PO LIQD: 237.0000 mL | Freq: Three times a day (TID) | ORAL | 12 refills | Status: DC

## 2017-06-23 MED ORDER — CALCIUM 1200 1200-1000 MG-UNIT PO CHEW: 1.0000 | CHEWABLE_TABLET | Freq: Every day | ORAL | 1 refills | Status: DC

## 2017-06-23 NOTE — Assessment & Plan Note (Signed)
Chronic and stable. She is followed by Neurology and Psychiatry. Caregiver, [name], accompanied her to visit today.  - Continue following with Neurology and Psychiatry.

## 2017-06-23 NOTE — Assessment & Plan Note (Addendum)
Assessment DEXA scan in 04/2017 showed T score of -2.3, consistent with osteopenia. Patient is on cholecalciferol 1000u daily. Results shared with patient today.  Plan - Discontinue cholecalciferol 1030mcg daily - START calcium carbonate + Vit D 1200-1000mg  daily

## 2017-06-23 NOTE — Patient Instructions (Signed)
FOLLOW-UP INSTRUCTIONS When: 4 months For: management of Down's syndrome What to bring: medications   Ms. Dovel,  It was a pleasure to meet you today.  I have sent orders for incontinence supplies (including diapers, chuckpads, and wipes) as well as a power lift recliner chair. [facility] will send these to [facility] - If you do not hear from them by Friday 1/18, please give them a call.  I have also printed a prescription for Ensure three times daily. Chilon will help to see what programs Ms. Mcgillivray is eligible for and whether these programs will pay for her Ensure.  Your bone density scan showed that you have osteopenia - which means a weakening of the bones. I have sent a prescription for Calcium + Vitamin D pills. Please stop taking your previous Vitamin D pills and start taking this new one Calcium + Vitamin D 1200-1000mg  once daily.  I will see you back in 4 months.

## 2017-06-23 NOTE — Progress Notes (Signed)
Internal Medicine Clinic Attending  Case discussed with Dr. Huang at the time of the visit.  We reviewed the resident's history and exam and pertinent patient test results.  I agree with the assessment, diagnosis, and plan of care documented in the resident's note. 

## 2017-06-23 NOTE — Assessment & Plan Note (Signed)
Amanda Castillo is a 62yo female with PMH of Down syndrome with associated moderate dementia which impairs her ability to ambulate and independently perform ADLs, including toileting, dressing, and personal hygiene. She is dependent on her caregiver for assistance with all ADLs. She is also incontinent of urine. She can ambulate for short distances with a walker but otherwise requires the use of a wheelchair. She also is prone to frequent falls and has difficulty with transfers. Caregiver requests a power lift to assist in the home with transfers, which I do think is appropriate given the complexity of [name]' medical condition. - Placed DME order for power lift chair

## 2017-06-23 NOTE — Assessment & Plan Note (Addendum)
Assessment Patient has urinary incontinence. Caregiver states that Ms. Narang is holding her urine and not urinating as frequently. Patient is also fearful of walking and thus not walking as much. She will go to the bathroom once in the morning after waking up. At her day program, she will not use the restroom at all during the day and then will come home and maybe use the bathroom once after dinner. She will urinate again before bedtime. Caregiver notes that she has been wetting the bed, requiring diapers, chuckpads, and wipes frequently. Caregiver has tried a bedside commode as well. Patient will urinate if sitting on the toilet but will not completely empty her bladder. Caregiver denies evidence of UTI. Patient denies dysuria.  Plan - Placed DME order for incontinence supplies

## 2017-06-23 NOTE — Progress Notes (Signed)
   CC: urinary incontinence  HPI: [name] is a 62 y.o. female with PMH significant for Down's syndrome c/b moderate dementia, sleep apnea, and hypothyroidism who presents with c/o urinary incontinence and for bone density results. She is accompanied by her caregiver, [name].  Please see the assessment and plan below for the status of the patient's chronic medical problems.  Past Medical History:  Diagnosis Date  . Allergy   . Depression   . Down's syndrome   . Hallucinations   . Hyperlipidemia   . Hypothyroidism   . Moderate intellectual disability   . Orthostatic hypotension 06/06/2015  . PVD (peripheral vascular disease) ([place])   . Sleep apnea   . Stroke [facility])    in 2006   Review of Systems: GEN: No fevers. CV: No chest pain. PULM: No shortness of breath or cough. EXT: No leg swelling. GU: No dysuria. Positive for urinary incontinence.  Physical Exam: Vitals:   06/23/17 1534  BP: (!) 95/28  Pulse: (!) 55  Temp: (!) 97.5 F (36.4 C)  TempSrc: Oral  SpO2: 100%  Weight: 142 lb 1.6 oz (64.5 kg)  Height: 4\' 10"  (1.473 m)   GEN: Sitting up in chair in no acute distress. Able to speak in short sentences and to respond to questions in 1-2 word responses. CV: NR & RR, No m/r/g PULM: CTAB, no wheezes ABD: Soft, NT, ND EXT: No edema  Assessment & Plan:   See Encounters Tab for problem based charting.  Patient discussed with Dr. Willa Rough, MD Internal Medicine, PGY-1

## 2017-07-02 DIAGNOSIS — F0634 Mood disorder due to known physiological condition with mixed features: Secondary | ICD-10-CM | POA: Diagnosis not present

## 2017-07-02 DIAGNOSIS — Q909 Down syndrome, unspecified: Secondary | ICD-10-CM | POA: Diagnosis not present

## 2017-07-03 ENCOUNTER — Ambulatory Visit (INDEPENDENT_AMBULATORY_CARE_PROVIDER_SITE_OTHER): Payer: Medicare Other | Admitting: Internal Medicine

## 2017-07-03 ENCOUNTER — Encounter: Payer: Self-pay | Admitting: Internal Medicine

## 2017-07-03 ENCOUNTER — Other Ambulatory Visit: Payer: Self-pay

## 2017-07-03 ENCOUNTER — Ambulatory Visit (HOSPITAL_COMMUNITY)
Admission: RE | Admit: 2017-07-03 | Discharge: 2017-07-03 | Disposition: A | Discharge status: Home / Self Care | Payer: Medicare Other | Source: Ambulatory Visit | Attending: Internal Medicine | Admitting: Internal Medicine

## 2017-07-03 VITALS — BP 124/59 | HR 57 | Temp 97.6°F | Ht <= 58 in | Wt 142.6 lb

## 2017-07-03 DIAGNOSIS — F0390 Unspecified dementia without behavioral disturbance: Secondary | ICD-10-CM

## 2017-07-03 DIAGNOSIS — G4730 Sleep apnea, unspecified: Secondary | ICD-10-CM

## 2017-07-03 DIAGNOSIS — Z8673 Personal history of transient ischemic attack (TIA), and cerebral infarction without residual deficits: Secondary | ICD-10-CM | POA: Diagnosis not present

## 2017-07-03 DIAGNOSIS — M1711 Unilateral primary osteoarthritis, right knee: Secondary | ICD-10-CM | POA: Insufficient documentation

## 2017-07-03 DIAGNOSIS — E039 Hypothyroidism, unspecified: Secondary | ICD-10-CM

## 2017-07-03 DIAGNOSIS — M170 Bilateral primary osteoarthritis of knee: Secondary | ICD-10-CM | POA: Diagnosis not present

## 2017-07-03 DIAGNOSIS — M25561 Pain in right knee: Secondary | ICD-10-CM | POA: Insufficient documentation

## 2017-07-03 DIAGNOSIS — W19XXXA Unspecified fall, initial encounter: Secondary | ICD-10-CM | POA: Diagnosis not present

## 2017-07-03 DIAGNOSIS — Q909 Down syndrome, unspecified: Secondary | ICD-10-CM

## 2017-07-03 NOTE — Progress Notes (Signed)
Medicine attending: Medical history, presenting problems, physical findings, and medications, reviewed with resident physician Dr Colbert Ewing on the day of the patient visit and I concur with her evaluation and management plan. Pt fell down, struck knee. No sign of external trauma. We will get routine X-ray to R/O fracture. Rx symptomatically.

## 2017-07-03 NOTE — Assessment & Plan Note (Signed)
Assessment Patient complains of acute pain to her right knee and right thigh after ?fall 2 days ago. Physical exam with tenderness to palpation of right medial knee, but no evidence of erythema, lesions, bruising, or swelling. Able to bear weight on leg, however she has been unwilling to walk for the last few weeks, likely complicated by Down's syndrome. It is unlikely that she has a fracture and most likely a bruise/contusion, however given that she has Down's, I think it is reasonable to check an x-ray today.  Plan - Right knee x-ray - Recommended icy hot patches as needed for pain

## 2017-07-03 NOTE — Patient Instructions (Addendum)
FOLLOW-UP INSTRUCTIONS When: 4 months For: health maintenance What to bring: medications    Amanda Castillo,  It was a pleasure to see you today.  Your pain is most likely a bruise due to the fall. I don't think you have a fracture, but we are going to get an x-ray of your right knee to double check.You can try icy hot patches on your knee for pain relief.  Please return to our clinic in 4 months for follow-up.

## 2017-07-03 NOTE — Progress Notes (Signed)
   CC: right knee pain  HPI: [name] is a 62 y.o. female with PMH significant for Down's syndrome c/b moderate dementia, hypothyroidism, sleep apnea, CVA, and depression who presents with right knee pain. Caregiver, [name], assists with history.  2 days ago, [name] states that the patient threw herself on the floor. Since 2 days ago, patient has been complaining of right leg pain. She does move all extremities spontaneously and was able to walk 3 steps today. Patient has been unwilling to walk for the last few weeks. When asked why, patient is unable to answer.   Left knee x-ray in 2015 showed advanced tricompartmental degenerative disease Right knee x-ray in 2015 showed progressive osteoarthritis.  Past Medical History:  Diagnosis Date  . Allergy   . Depression   . Down's syndrome   . Hallucinations   . Hyperlipidemia   . Hypothyroidism   . Moderate intellectual disability   . Orthostatic hypotension 06/06/2015  . PVD (peripheral vascular disease) ([place])   . Sleep apnea   . Stroke ([place])    in 2006   Review of Systems:   GEN: Negative for fevers MSK: Positive for right lateral thigh pain and right medial knee pain.  Physical Exam:  Vitals:   07/03/17 1348  BP: (!) 124/59  Pulse: (!) 57  Temp: 97.6 F (36.4 C)  TempSrc: Oral  SpO2: 100%  Weight: 142 lb 9.6 oz (64.7 kg)  Height: 4\' 10"  (1.473 m)   GEN: Sitting in wheelchair in NAD MSK: TTP on right lateral thigh and right medial knee. No effusion of knee. No erythema, lesions, or bruising evident.  Assessment & Plan:   See Encounters Tab for problem based charting.  Patient discussed with Dr. Beryle Beams

## 2017-07-10 ENCOUNTER — Other Ambulatory Visit: Payer: Self-pay | Admitting: *Deleted

## 2017-07-10 ENCOUNTER — Other Ambulatory Visit: Payer: Self-pay | Admitting: Internal Medicine

## 2017-07-10 MED ORDER — ENSURE ENLIVE PO LIQD: 237.0000 mL | Freq: Three times a day (TID) | ORAL | 12 refills | Status: DC

## 2017-07-11 ENCOUNTER — Telehealth: Payer: Self-pay | Admitting: Internal Medicine

## 2017-07-11 NOTE — Telephone Encounter (Signed)
Advised caregiver Pt's insurance will not be able to cover the Ensure she requested.  Mailed patient an Pharmacist, community to fill out for [employer].  Caregiver to complete form and reach out to their company for assistance.

## 2017-07-11 NOTE — Telephone Encounter (Signed)
Order have been faxed to [facility] for patient Ensure as well as Fax over DME for Lift Chair for the (3rd time).  Please have the patient contact [facility] @ [phone] if any questions.

## 2017-07-14 ENCOUNTER — Ambulatory Visit: Payer: Medicare Other | Admitting: Podiatry

## 2017-08-18 ENCOUNTER — Other Ambulatory Visit: Payer: Self-pay | Admitting: Internal Medicine

## 2017-09-01 ENCOUNTER — Encounter: Payer: Self-pay | Admitting: Podiatry

## 2017-09-01 ENCOUNTER — Ambulatory Visit (INDEPENDENT_AMBULATORY_CARE_PROVIDER_SITE_OTHER): Payer: Medicare Other | Admitting: Podiatry

## 2017-09-01 DIAGNOSIS — M79672 Pain in left foot: Secondary | ICD-10-CM

## 2017-09-01 DIAGNOSIS — B351 Tinea unguium: Secondary | ICD-10-CM

## 2017-09-01 DIAGNOSIS — M79671 Pain in right foot: Secondary | ICD-10-CM | POA: Diagnosis not present

## 2017-09-01 NOTE — Patient Instructions (Signed)
Seen for hypertrophic nails. All nails debrided. Return in 3 months or as needed.  

## 2017-09-01 NOTE — Progress Notes (Signed)
Subjective: 62 y.o. year old female patient presents accompanied by a care taker using a wheeled walker with painful nails. Patient requests toe nails trimmed.   Objective: Dermatologic: Thick yellow deformed nails x 10 with fungal debris. Vascular: Pedal pulses are all palpable. Orthopedic: Positive for severe hallux valgus with bunion right, laterally deviated 2nd digit and under ride to the 3rd toe right. Neurologic: All epicritic and tactile sensations grossly intact.  Assessment: Dystrophic mycotic nails x 10.  Treatment: All mycotic nails debrided.  Return in 3 months or as needed.

## 2017-10-16 ENCOUNTER — Encounter: Payer: Self-pay | Admitting: Internal Medicine

## 2017-10-16 ENCOUNTER — Ambulatory Visit (INDEPENDENT_AMBULATORY_CARE_PROVIDER_SITE_OTHER): Payer: Medicare Other | Admitting: Internal Medicine

## 2017-10-16 ENCOUNTER — Other Ambulatory Visit: Payer: Self-pay

## 2017-10-16 VITALS — BP 97/53 | HR 72 | Temp 97.6°F | Ht <= 58 in

## 2017-10-16 DIAGNOSIS — Z7289 Other problems related to lifestyle: Secondary | ICD-10-CM | POA: Diagnosis not present

## 2017-10-16 DIAGNOSIS — E785 Hyperlipidemia, unspecified: Secondary | ICD-10-CM | POA: Diagnosis not present

## 2017-10-16 DIAGNOSIS — Z23 Encounter for immunization: Secondary | ICD-10-CM

## 2017-10-16 DIAGNOSIS — Z1239 Encounter for other screening for malignant neoplasm of breast: Secondary | ICD-10-CM

## 2017-10-16 DIAGNOSIS — E039 Hypothyroidism, unspecified: Secondary | ICD-10-CM | POA: Diagnosis not present

## 2017-10-16 DIAGNOSIS — Z8673 Personal history of transient ischemic attack (TIA), and cerebral infarction without residual deficits: Secondary | ICD-10-CM

## 2017-10-16 DIAGNOSIS — G4733 Obstructive sleep apnea (adult) (pediatric): Secondary | ICD-10-CM | POA: Diagnosis not present

## 2017-10-16 DIAGNOSIS — Z79890 Hormone replacement therapy (postmenopausal): Secondary | ICD-10-CM | POA: Diagnosis not present

## 2017-10-16 DIAGNOSIS — Z79899 Other long term (current) drug therapy: Secondary | ICD-10-CM

## 2017-10-16 DIAGNOSIS — F71 Moderate intellectual disabilities: Secondary | ICD-10-CM | POA: Diagnosis not present

## 2017-10-16 DIAGNOSIS — Z0000 Encounter for general adult medical examination without abnormal findings: Secondary | ICD-10-CM | POA: Diagnosis not present

## 2017-10-16 DIAGNOSIS — Q909 Down syndrome, unspecified: Secondary | ICD-10-CM

## 2017-10-16 DIAGNOSIS — M8580 Other specified disorders of bone density and structure, unspecified site: Secondary | ICD-10-CM

## 2017-10-16 NOTE — Patient Instructions (Signed)
FOLLOW-UP INSTRUCTIONS When: 6 months For: health maintenance What to bring: medications   Ms. Klute,  It was good to see you again!   You got a tetanus shot and hepatitis C screening done today. I will let you know the results when I get them back. We also placed an order for a mammogram.  Happy late birthday! I hope you enjoyed all the cake and gifts. I'm glad that you're doing well. Continue doing what you are doing. We'll see you back in about 6 months.  If you have any questions or concerns, call our clinic at [phone] or after hours call [phone] and ask for the internal medicine resident on call.

## 2017-10-16 NOTE — Progress Notes (Signed)
   CC: management of Down syndrome  HPI:  [name] is a 62 y.o. female with PMH of moderate intellectual disability secondary to Down syndrome, history of CVA, OSA, and hypothyroidism who presents for management of Down syndrome. Presents with caretaker, Amanda Castillo, at bedside.  She reports feeling well. She has no complaints. She had a birthday party recently with chocolate and vanilla cake, as well as receiving a lot of gifts. Amanda Castillo tells me there are no new issues and that Amanda Castillo is doing well.  Please see the assessment and plan below for the status of the patient's chronic medical problems.  Past Medical History:  Diagnosis Date  . Allergy   . Depression   . Down's syndrome   . Hallucinations   . Hyperlipidemia   . Hypothyroidism   . Moderate intellectual disability   . Orthostatic hypotension 06/06/2015  . PVD (peripheral vascular disease) ([place])   . Sleep apnea   . Stroke ([place])    in 2006   Review of Systems:  GEN: Negative for fevers CV: Negative for chest pain  PULM: Negative for cough or SOB EXT: Negative for LE swelling  Physical Exam:  Vitals:   10/16/17 1350  BP: (!) 97/53  Pulse: 72  Temp: 97.6 F (36.4 C)  TempSrc: Oral  SpO2: 100%  Height: 4\' 10"  (1.473 m)   GEN: Sitting comfortably in wheelchair in NAD CV: NR & RR, no m/r/g PULM: CTAB, no wheezes or rales EXT: No LE edema  Assessment & Plan:   See Encounters Tab for problem based charting.  Patient discussed with Dr. Dareen Piano

## 2017-10-16 NOTE — Assessment & Plan Note (Addendum)
Breast cancer screening - Mammogram ordered  HCV screening - HCV Ab ordered ADDENDUM: HCV Ab negative. Spoke to patient's caregiver, [name], over the phone and informed her of negative results.  Tetanus immunization - TDAP administered today in clinic

## 2017-10-16 NOTE — Assessment & Plan Note (Signed)
Stable - Continue simvastatin 40mg  [facility]

## 2017-10-16 NOTE — Assessment & Plan Note (Addendum)
Stable. TSH was normal in 03/2017 - Continue levothyroxine 62.69mcg daily

## 2017-10-16 NOTE — Assessment & Plan Note (Signed)
Stable - Taking OTC calcium-vitamin D

## 2017-10-16 NOTE — Progress Notes (Signed)
Internal Medicine Clinic Attending  Case discussed with Dr. Huang at the time of the visit.  We reviewed the resident's history and exam and pertinent patient test results.  I agree with the assessment, diagnosis, and plan of care documented in the resident's note. 

## 2017-10-17 LAB — HEPATITIS C ANTIBODY

## 2017-10-30 ENCOUNTER — Encounter: Payer: Self-pay | Admitting: Internal Medicine

## 2017-11-18 ENCOUNTER — Other Ambulatory Visit: Payer: Self-pay | Admitting: *Deleted

## 2017-11-18 DIAGNOSIS — E039 Hypothyroidism, unspecified: Secondary | ICD-10-CM

## 2017-11-20 MED ORDER — LEVOTHYROXINE SODIUM 125 MCG PO TABS: 62.5000 ug | ORAL_TABLET | Freq: Every day | ORAL | 1 refills | Status: DC

## 2017-11-21 ENCOUNTER — Ambulatory Visit: Payer: Self-pay

## 2017-11-30 ENCOUNTER — Encounter: Payer: Self-pay | Admitting: *Deleted

## 2017-12-08 ENCOUNTER — Ambulatory Visit: Payer: Medicare Other | Admitting: Podiatry

## 2017-12-10 ENCOUNTER — Ambulatory Visit
Admission: RE | Admit: 2017-12-10 | Discharge: 2017-12-10 | Disposition: A | Discharge status: Home / Self Care | Payer: Medicare Other | Source: Ambulatory Visit | Attending: Internal Medicine | Admitting: Internal Medicine

## 2017-12-10 DIAGNOSIS — Z1231 Encounter for screening mammogram for malignant neoplasm of breast: Secondary | ICD-10-CM | POA: Diagnosis not present

## 2017-12-10 DIAGNOSIS — Z1239 Encounter for other screening for malignant neoplasm of breast: Secondary | ICD-10-CM

## 2017-12-17 ENCOUNTER — Ambulatory Visit: Payer: Medicare Other | Admitting: Podiatry

## 2017-12-24 ENCOUNTER — Encounter: Payer: Self-pay | Admitting: Podiatry

## 2017-12-24 ENCOUNTER — Ambulatory Visit (INDEPENDENT_AMBULATORY_CARE_PROVIDER_SITE_OTHER): Payer: Medicare Other | Admitting: Podiatry

## 2017-12-24 DIAGNOSIS — M79672 Pain in left foot: Secondary | ICD-10-CM | POA: Diagnosis not present

## 2017-12-24 DIAGNOSIS — M79671 Pain in right foot: Secondary | ICD-10-CM

## 2017-12-24 DIAGNOSIS — B351 Tinea unguium: Secondary | ICD-10-CM | POA: Diagnosis not present

## 2017-12-24 NOTE — Patient Instructions (Signed)
Seen for hypertrophic nails. All nails debrided. Return in 3 months or as needed.  

## 2017-12-24 NOTE — Progress Notes (Signed)
Subjective: 62 y.o. year old female wheelchair bound patient presents with two care takers requesting painful nails trimmed.  Had a stroke over 3-4 years ago and got weakened and got dependent on wheel chair. Patient is diagnosed with Dementia and Down syndrome.  Objective: Dermatologic: Thick yellow deformed nails with fungal debris 1-5 bilateral. Vascular: Pedal pulses are all palpable. Orthopedic: Severely subluxed first MPJ with lateral deviation of both great toes. Neurologic: All epicritic and tactile sensations grossly intact.  Assessment: Dystrophic mycotic nails x 10. Painful feet.  Treatment: All mycotic nails debrided.  Return in 3 months or as needed.

## 2017-12-29 DIAGNOSIS — Q909 Down syndrome, unspecified: Secondary | ICD-10-CM | POA: Diagnosis not present

## 2017-12-29 DIAGNOSIS — F0634 Mood disorder due to known physiological condition with mixed features: Secondary | ICD-10-CM | POA: Diagnosis not present

## 2018-01-07 ENCOUNTER — Other Ambulatory Visit: Payer: Self-pay

## 2018-01-07 ENCOUNTER — Encounter (HOSPITAL_COMMUNITY): Payer: Self-pay | Admitting: Emergency Medicine

## 2018-01-07 ENCOUNTER — Emergency Department (HOSPITAL_COMMUNITY): Payer: Medicare Other

## 2018-01-07 ENCOUNTER — Emergency Department (HOSPITAL_COMMUNITY)
Admission: EM | Admit: 2018-01-07 | Discharge: 2018-01-07 | Disposition: A | Discharge status: Home / Self Care | Payer: Medicare Other | Attending: Emergency Medicine | Admitting: Emergency Medicine

## 2018-01-07 DIAGNOSIS — E039 Hypothyroidism, unspecified: Secondary | ICD-10-CM | POA: Diagnosis not present

## 2018-01-07 DIAGNOSIS — G3184 Mild cognitive impairment, so stated: Secondary | ICD-10-CM | POA: Diagnosis not present

## 2018-01-07 DIAGNOSIS — E1165 Type 2 diabetes mellitus with hyperglycemia: Secondary | ICD-10-CM | POA: Diagnosis not present

## 2018-01-07 DIAGNOSIS — Q909 Down syndrome, unspecified: Secondary | ICD-10-CM | POA: Insufficient documentation

## 2018-01-07 DIAGNOSIS — R55 Syncope and collapse: Secondary | ICD-10-CM | POA: Diagnosis not present

## 2018-01-07 DIAGNOSIS — Z7982 Long term (current) use of aspirin: Secondary | ICD-10-CM | POA: Insufficient documentation

## 2018-01-07 DIAGNOSIS — Z79899 Other long term (current) drug therapy: Secondary | ICD-10-CM | POA: Insufficient documentation

## 2018-01-07 DIAGNOSIS — R404 Transient alteration of awareness: Secondary | ICD-10-CM | POA: Diagnosis not present

## 2018-01-07 DIAGNOSIS — I959 Hypotension, unspecified: Secondary | ICD-10-CM | POA: Diagnosis not present

## 2018-01-07 LAB — CBC
HEMATOCRIT: 43.9 % (ref 36.0–46.0)
Hemoglobin: 13.6 g/dL (ref 12.0–15.0)
MCH: 30.8 pg (ref 26.0–34.0)
MCHC: 31 g/dL (ref 30.0–36.0)
MCV: 99.3 fL (ref 78.0–100.0)
Platelets: 175 10*3/uL (ref 150–400)
RBC: 4.42 MIL/uL (ref 3.87–5.11)
RDW: 16.2 % — AB (ref 11.5–15.5)
WBC: 4.6 10*3/uL (ref 4.0–10.5)

## 2018-01-07 LAB — BASIC METABOLIC PANEL
ANION GAP: 11 (ref 5–15)
BUN: 14 mg/dL (ref 6–20)
CHLORIDE: 107 mmol/L (ref 98–111)
CO2: 23 mmol/L (ref 22–32)
Calcium: 9.1 mg/dL (ref 8.9–10.3)
Creatinine, Ser: 1.15 mg/dL — ABNORMAL HIGH (ref 0.44–1.00)
GFR calc non Af Amer: 51 mL/min — ABNORMAL LOW (ref >60)
GFR, EST AFRICAN AMERICAN: 59 mL/min — AB (ref >60)
Glucose, Bld: 112 mg/dL — ABNORMAL HIGH (ref 70–99)
POTASSIUM: 4.6 mmol/L (ref 3.5–5.1)
Sodium: 141 mmol/L (ref 135–145)

## 2018-01-07 NOTE — ED Provider Notes (Signed)
[facility] EMERGENCY DEPARTMENT Provider Note   CSN: [id number] Arrival date & time: 01/07/18  1504     History   Chief Complaint Chief Complaint  Patient presents with  . Loss of Consciousness    HPI Amanda Castillo is a 62 y.o. female.  HPI Patient is a 62 year old female with history of Down's syndrome, hypertension, hyperlipidemia, hypothyroidism, peripheral vascular disease, and OSA who is presents to the emergency department for evaluation of syncopal episode.  Is accompanied by her caregiver here in the emergency department whom she lives with.  She states another caregiver was with her at time of syncopal event.  Reports patient was eating lunch and after completing lunch "had a loud snore and her eyes rolled back and she lost consciousness for a few seconds." Reports she returned immediately to her baseline. No jerking movements or seizure-like activity.  No loss of bowel or bladder function. No hx of seizures. This caregiver in ED reports that patient was at her baseline prior to and has been at her baseline since syncopal event. Has not complained of any pain. History of similar episode approx 1 year ago for which they state she was seen for with no findings to explain her syncope. Her sister is her legal guardian. Pt can ambulate but typically prefers wheelchair and often will only stand to assist transfers at home. Will answer yes/no questions primarily. Took half tab ativan last night for sleep but no other new meds.    Past Medical History:  Diagnosis Date  . Allergy   . Depression   . Down's syndrome   . Hallucinations   . Hyperlipidemia   . Hypothyroidism   . Moderate intellectual disability   . Orthostatic hypotension 06/06/2015  . PVD (peripheral vascular disease) ([place])   . Sleep apnea   . Stroke [facility])    in 2006    Patient Active Problem List   Diagnosis Date Noted  . Osteopenia 06/23/2017  . Incontinence of urine in female 06/23/2017    . Vitamin D deficiency 03/19/2017  . At high risk for falls 03/19/2017  . Allergic rhinitis 02/28/2016  . Health care maintenance 07/27/2015  . Sinus bradycardia   . Dementia associated with other underlying disease with behavioral disturbance 05/18/2015  . Down syndrome 03/24/2014  . Hyperlipidemia 03/24/2014  . Hypothyroidism 03/24/2014  . Sleep apnea 03/24/2014    Past Surgical History:  Procedure Laterality Date  . COLONOSCOPY  06/12/2012  . orif lt foot    . TONSILLECTOMY       OB History   None      Home Medications    Prior to Admission medications   Medication Sig Start Date End Date Taking? Authorizing Provider  aspirin 81 MG tablet Take 1 tablet (81 mg total) by mouth daily. 03/19/17   Velna Ochs, MD  Calcium Carbonate-Vit D-Min (CALCIUM 1200) 1200-1000 MG-UNIT CHEW Chew 1 tablet by mouth daily. 06/23/17   Colbert Ewing, MD  docusate sodium (COLACE) 100 MG capsule Take 1 capsule (100 mg total) by mouth 2 (two) times daily. 03/19/17   Velna Ochs, MD  Doxepin HCl (SILENOR) 6 MG TABS Take 6 mg by mouth at bedtime.    [provider]  feeding supplement, ENSURE ENLIVE, (ENSURE ENLIVE) LIQD Take 237 mLs by mouth 3 (three) times daily between meals. 07/10/17   Colbert Ewing, MD  Iloperidone (FANAPT) 6 MG TABS Take 6 mg by mouth at bedtime.    [provider]  levothyroxine (SYNTHROID, LEVOTHROID) 125 MCG tablet Take 0.5 tablets (62.5 mcg total) by mouth daily before breakfast. 11/20/17   Colbert Ewing, MD  loratadine (CLARITIN) 10 MG tablet Take 1 tablet (10 mg total) by mouth daily. 03/19/17   Velna Ochs, MD  Propylene Glycol (SYSTANE BALANCE OP) Apply 1 drop to eye 2 (two) times daily as needed.    [provider]  simvastatin (ZOCOR) 40 MG tablet Take 1 tablet (40 mg total) by mouth at bedtime. At bedtime 03/19/17   Velna Ochs, MD  sodium chloride (OCEAN) 0.65 % SOLN nasal spray Place 1 spray into both nostrils at  bedtime as needed for congestion.     [provider]  terbinafine (LAMISIL) 1 % cream Apply 1 application topically 2 (two) times daily as needed.    [provider]    Family History Family History  Problem Relation Age of Onset  . Colon cancer Paternal Uncle   . Colon cancer Maternal Grandfather     Social History Social History   Tobacco Use  . Smoking status: Never Smoker  . Smokeless tobacco: Never Used  Substance Use Topics  . Alcohol use: No    Alcohol/week: 0.0 oz  . Drug use: No     Allergies   Patient has no known allergies.   Review of Systems Review of Systems  Reason unable to perform ROS: limited by patients baseline cognitive impairment.  Constitutional: Negative for fever.  Respiratory: Negative for cough.   Cardiovascular: Negative for chest pain and leg swelling.  Gastrointestinal: Negative for abdominal pain, diarrhea, nausea and vomiting.  Neurological: Negative for seizures and headaches.  Psychiatric/Behavioral: Negative for confusion.  All other systems reviewed and are negative.    Physical Exam Updated Vital Signs BP (!) 121/51   Pulse 63   Temp 97.7 F (36.5 C) (Oral)   Resp 17   SpO2 100%   Physical Exam  Constitutional: No distress.  HENT:  Head: Atraumatic.  Right Ear: External ear normal.  Left Ear: External ear normal.  Eyes: Conjunctivae are normal. Right eye exhibits no discharge. Left eye exhibits no discharge.  Neck: Neck supple.  Cardiovascular: Regular rhythm and intact distal pulses.  Pulmonary/Chest: Effort normal and breath sounds normal. No respiratory distress.  Abdominal: Soft. She exhibits no distension. There is no tenderness. There is no rebound and no guarding.  Musculoskeletal: She exhibits no edema.  Neurological: She is alert.  Oriented self and caregiver's name. Answers yes/no questions. Smiles and interactive with exam. 5/5 griip bilat. No facial asymmetry. Moving BLE's and follows  commands briskly.   Skin: Skin is warm and dry. She is not diaphoretic.  Psychiatric: She has a normal mood and affect.  Nursing note and vitals reviewed.    ED Treatments / Results  Labs (all labs ordered are listed, but only abnormal results are displayed) Labs Reviewed  BASIC METABOLIC PANEL - Abnormal; Notable for the following components:      Result Value   Glucose, Bld 112 (*)    Creatinine, Ser 1.15 (*)    GFR calc non Af Amer 51 (*)    GFR calc Af Amer 59 (*)    All other components within normal limits  CBC - Abnormal; Notable for the following components:   RDW 16.2 (*)    All other components within normal limits  URINALYSIS, ROUTINE W REFLEX MICROSCOPIC  CBG MONITORING, ED    EKG EKG Interpretation  Date/Time:  Wednesday January 07 2018 15:14:41 EDT  Ventricular Rate:  66 PR Interval:  134 QRS Duration: 74 QT Interval:  416 QTC Calculation: 436 R Axis:   83 Text Interpretation:  Normal sinus rhythm Nonspecific T wave abnormality No significant change since last tracing Confirmed by Blanchie Dessert [phone]) on 01/07/2018 4:36:54 PM   Radiology Dg Chest 2 View  Result Date: 01/07/2018 CLINICAL DATA:  62 y/o F; syncopal episode. History of dementia and Down syndrome. EXAM: CHEST - 2 VIEW COMPARISON:  01/16/2015 chest radiograph FINDINGS: Stable heart size and mediastinal contours are within normal limits. Both lungs are clear. The visualized skeletal structures are unremarkable. IMPRESSION: No acute pulmonary process identified. Electronically Signed   By: Kristine Garbe M.D.   On: 01/07/2018 17:40    Procedures Procedures (including critical care time)  Medications Ordered in ED Medications - No data to display   Initial Impression / Assessment and Plan / ED Course  I have reviewed the triage vital signs and the nursing notes.  Pertinent labs & imaging results that were available during my care of the patient were reviewed by me and considered in  my medical decision making (see chart for details).    Patient is a 62 year old female with history of Down's syndrome, hypertension, hyperlipidemia, hypothyroidism, peripheral vascular disease, and OSA who is presents to the emergency department for evaluation of syncopal episode.   Patient afebrile, hemodynamically stable, well-appearing.  At her baseline according to sister and caregiver at bedside.  Moving all extremities.  Today's episode not consistent with seizure based on history and no prior history of seizures.  No significant murmurs appreciated.  Reviewed echo from June 2016 which was notable only for possible bicuspid aortic valve with mild regurgitation.  No other significant valvular abnormalities noted.  LVEF 55-60% with no wall motion of normalities at that time.  Patient denies any chest pain or shortness of breath and has not complained complaining of any pain or has any other associated symptoms to suggest ACS.  No leg swelling, no tachycardia, no hypoxia, no increased work of breathing or tachypnea to suggest PE.  ECG reviewed Shows right approximate 65, NSR, normal axis, intervals within normal limits, no acute ST or T wave changes to suggest ischemia.  No significant changes when compared to December 2016 ECG.  Labs including CBC and BMP reviewed each of which are unremarkable.  Renal function similar to baseline.  Tolerating p.o. in the emergency department.  Chest x-ray obtained shows no acute cardiac or pulmonary abnormality.  Syncopal event appears to be similar to prior syncopal events.  Patients sister and caregiver have no other acute concerns.  As patient is at baseline, well appearing, and unremarkable workup she is stable for dc with strict return precautions provided. Advised to f/u with PCP.   Case and plan of care discussed with Dr. Maryan Rued.   Final Clinical Impressions(s) / ED Diagnoses   Final diagnoses:  Syncope, unspecified syncope type    ED Discharge Orders     None       Corrie Dandy, MD 01/07/18 Beverly Sessions    Blanchie Dessert, MD 01/08/18 [phone]

## 2018-01-07 NOTE — ED Notes (Signed)
Patient transported to X-ray 

## 2018-01-07 NOTE — ED Notes (Signed)
Pt family at bedside. Reports hx of syncopal episodes, abdominal pain, and leg pain and states that pt used wheelchair or rolling walker at home.

## 2018-01-07 NOTE — ED Triage Notes (Addendum)
Pt from home with family brought in by ems. Pt had a syncopal episode during lunch while sitting in chair, home health care assisted her to the ground without any injuries to pt. Pt denies pain. Pt at baseline, A&O to person. Hx of dementia, down's syndrome and stroke without deficits.  EMS VS CBG 358, BP 106/52, 98% room air, HR 52.

## 2018-03-26 ENCOUNTER — Ambulatory Visit: Payer: Medicare Other | Admitting: Podiatry

## 2018-04-14 DIAGNOSIS — F0634 Mood disorder due to known physiological condition with mixed features: Secondary | ICD-10-CM | POA: Diagnosis not present

## 2018-04-14 DIAGNOSIS — Q909 Down syndrome, unspecified: Secondary | ICD-10-CM | POA: Diagnosis not present

## 2018-04-15 ENCOUNTER — Other Ambulatory Visit: Payer: Self-pay

## 2018-04-15 ENCOUNTER — Encounter: Payer: Self-pay | Admitting: Internal Medicine

## 2018-04-15 ENCOUNTER — Ambulatory Visit (INDEPENDENT_AMBULATORY_CARE_PROVIDER_SITE_OTHER): Payer: Medicare Other | Admitting: Internal Medicine

## 2018-04-15 VITALS — BP 98/40 | HR 71 | Temp 98.0°F | Ht <= 58 in

## 2018-04-15 DIAGNOSIS — B372 Candidiasis of skin and nail: Secondary | ICD-10-CM | POA: Diagnosis not present

## 2018-04-15 DIAGNOSIS — E785 Hyperlipidemia, unspecified: Secondary | ICD-10-CM

## 2018-04-15 DIAGNOSIS — Z79899 Other long term (current) drug therapy: Secondary | ICD-10-CM | POA: Diagnosis not present

## 2018-04-15 DIAGNOSIS — F0390 Unspecified dementia without behavioral disturbance: Secondary | ICD-10-CM | POA: Diagnosis not present

## 2018-04-15 DIAGNOSIS — Z0000 Encounter for general adult medical examination without abnormal findings: Secondary | ICD-10-CM

## 2018-04-15 DIAGNOSIS — F0281 Dementia in other diseases classified elsewhere with behavioral disturbance: Secondary | ICD-10-CM

## 2018-04-15 DIAGNOSIS — F0634 Mood disorder due to known physiological condition with mixed features: Secondary | ICD-10-CM | POA: Diagnosis not present

## 2018-04-15 DIAGNOSIS — Z7289 Other problems related to lifestyle: Secondary | ICD-10-CM | POA: Diagnosis not present

## 2018-04-15 DIAGNOSIS — Z79890 Hormone replacement therapy (postmenopausal): Secondary | ICD-10-CM

## 2018-04-15 DIAGNOSIS — M2041 Other hammer toe(s) (acquired), right foot: Secondary | ICD-10-CM

## 2018-04-15 DIAGNOSIS — E559 Vitamin D deficiency, unspecified: Secondary | ICD-10-CM

## 2018-04-15 DIAGNOSIS — F02818 Dementia in other diseases classified elsewhere, unspecified severity, with other behavioral disturbance: Secondary | ICD-10-CM

## 2018-04-15 DIAGNOSIS — Q909 Down syndrome, unspecified: Secondary | ICD-10-CM | POA: Diagnosis not present

## 2018-04-15 DIAGNOSIS — L308 Other specified dermatitis: Secondary | ICD-10-CM | POA: Diagnosis not present

## 2018-04-15 DIAGNOSIS — E039 Hypothyroidism, unspecified: Secondary | ICD-10-CM | POA: Diagnosis not present

## 2018-04-15 DIAGNOSIS — J309 Allergic rhinitis, unspecified: Secondary | ICD-10-CM

## 2018-04-15 MED ORDER — LORATADINE 10 MG PO TABS: 10.0000 mg | ORAL_TABLET | Freq: Every day | ORAL | 3 refills | Status: DC

## 2018-04-15 MED ORDER — NYSTATIN 100000 UNIT/GM EX POWD: Freq: Four times a day (QID) | CUTANEOUS | 0 refills | Status: DC

## 2018-04-15 MED ORDER — DOXEPIN HCL 6 MG PO TABS: 6.0000 mg | ORAL_TABLET | Freq: Every day | ORAL | 2 refills | Status: DC

## 2018-04-15 MED ORDER — ASPIRIN 81 MG PO TABS: 81.0000 mg | ORAL_TABLET | Freq: Every day | ORAL | 5 refills | Status: DC

## 2018-04-15 MED ORDER — SIMVASTATIN 40 MG PO TABS: 40.0000 mg | ORAL_TABLET | Freq: Every day | ORAL | 3 refills | Status: DC

## 2018-04-15 MED ORDER — HYDROCORTISONE 1 % EX CREA: TOPICAL_CREAM | CUTANEOUS | 1 refills | Status: AC

## 2018-04-15 MED ORDER — ILOPERIDONE 6 MG PO TABS: 6.0000 mg | ORAL_TABLET | Freq: Every day | ORAL | 2 refills | Status: DC

## 2018-04-15 MED ORDER — LEVOTHYROXINE SODIUM 125 MCG PO TABS
62.5000 ug | ORAL_TABLET | Freq: Every day | ORAL | 1 refills | Status: DC
Start: 2018-04-15 — End: 2019-02-26

## 2018-04-15 MED ORDER — DOCUSATE SODIUM 100 MG PO CAPS: 100.0000 mg | ORAL_CAPSULE | Freq: Two times a day (BID) | ORAL | Status: AC

## 2018-04-15 MED ORDER — CALCIUM 1200 1200-1000 MG-UNIT PO CHEW: 1.0000 | CHEWABLE_TABLET | Freq: Every day | ORAL | 1 refills | Status: AC

## 2018-04-15 NOTE — Assessment & Plan Note (Signed)
Hypothyroidism: Patient is on levothyroxine 62.5 MCG daily. Denies any pertinent symptom Last TSH at 10/10 2018 was normal at 1.6 -TSH today -c/w levothyroxine 62.5 MCG daily.  May adjust based on TSH result. F/u in clinic in 6 months

## 2018-04-15 NOTE — Patient Instructions (Addendum)
Thank you for allowing [place] to provide your care today. I saw you for skin rash and general follow up. I prescribe you Nystatin powder to apply on rash under your breast. You can discontinue that when the rash resolved. Avoid wearing bra untill rash improves.  I also prescribe Hydrocortisone cream to be applied on the lesions on your toes 2 times daily and discontinue when redness improved. Please take rest of your medications as instructed. (Continue using Lamisil on the skin lesion above the ankle. Avoid Hydrocortisone on the lesion on the ankle)  I have ordered some labs for you. I will call if any are abnormal.   Please follow-up in our clinic in 3-6 months or sooner if no improvement.   Should you have any questions or concerns please call the internal medicine clinic at [phone].   Thanks Dr. Myrtie Hawk

## 2018-04-15 NOTE — Assessment & Plan Note (Signed)
Stable. Patient reviewed Down syndrome and associated moderate dementia and caregiver caregiver for ADLs as.  She used walker for ambulating short distance in wheelchair for long distance. Power lift ordered on last visit to assist patient transferring. She recently uses wheelchair all the time currently.

## 2018-04-15 NOTE — Assessment & Plan Note (Signed)
Was seen by psychiatrist yesterday. Also follows up with neurologist. Requested CMP, CBC with diff, Lipid panel.  -Lab performed today -c/w Doxepin, Fanapt and Ativan

## 2018-04-15 NOTE — Assessment & Plan Note (Addendum)
Patient presented with 2 weeks Hx of rash under her breast as well as redness and redness on her right toes and chronic rash above  her right ankle On exam has candidal interterigo under her breasts As well as edermatitis on her right toes Fungal patches above right ankle  -Nystatin powder to apply under breast area -Avoid wearing bras until rash improves -Hydrocortisone cream 1%, to apply on dermatitis on the toes under improves -C/w Lamisil on the lesion above the ankle

## 2018-04-15 NOTE — Assessment & Plan Note (Signed)
Hyperlipidemia: Patient is on simvastatin 40 mg daily. Last lipid panel 07/03/2016 with normal LDL at 92, HDL at 62, TG at 102 -Lipid panel today (requested by psychiatrics yesterday and performed in our lab today) -c/w Simvastatin 40 mg daily -c/w ASA 81 mg QD

## 2018-04-15 NOTE — Progress Notes (Signed)
   CC: Rash  HPI:  Amanda Castillo is a 62 y.o. documented past medical history as below, came into clinic today due to rash under her breasts for last 2 weeks.  Please see problem based charting for further details and assessment and plan.  Source of  history is mostly her caregiver who is present at room during the visit.  Past Medical History:  Diagnosis Date  . Allergy   . Depression   . Down's syndrome   . Hallucinations   . Hyperlipidemia   . Hypothyroidism   . Moderate intellectual disability   . Orthostatic hypotension 06/06/2015  . PVD (peripheral vascular disease) ([place])   . Sleep apnea   . Stroke [facility])    in 2006    Family history: Colon cancer in uncle Colon cancer in grandfather  Social history: Patient with history of Down syndrome and moderate dementia. Goes to the center during the day. is dependent to have caregiver at home for ADLs. Denies smoking Denies alcohol use Denies illicit drug use  Review of Systems: No fever.  No chest pain.  No redness of breath.  No change in her appetite.  No nausea or vomiting.  No abdominal pain.  No diarrhea.  Physical Exam:  Vitals:   04/15/18 1425  BP: (!) 98/40  Pulse: 71  Temp: 98 F (36.7 C)  TempSrc: Oral  SpO2: 99%  Height: 4\' 10"  (1.473 m)   General: Pleasant lady, with Down syndrome.  Has minimal verbal.  Caregiver is in the room. Constitutional: Well-nourished CV: RRR, no murmur Lungs: Normal work of breathing, clear to auscultation bilaterally, no wheeze, no rale Chest: Has rash under her breast Abdomen: Soft and nontender, BS is present Extremities: Hammertoe at right foot.  There is erythema, exfoliated rash over the ankle No pitting edema.  Pulses are normal Neurologic: Moves all her extremities  Assessment & Plan:   See Encounters Tab for problem based charting Patient seen with Dr. Daryll Drown

## 2018-04-15 NOTE — Assessment & Plan Note (Signed)
Patient is on 2000 units vitamin D daily. Last vitamin D 2018 was at 66  -c/w Vitamin D thousand units daily - Check 25 OH vit D

## 2018-04-16 ENCOUNTER — Encounter: Payer: Self-pay | Admitting: *Deleted

## 2018-04-16 LAB — CBC WITH DIFFERENTIAL/PLATELET
BASOS ABS: 0.1 10*3/uL (ref 0.0–0.2)
Basos: 1 %
EOS (ABSOLUTE): 0.1 10*3/uL (ref 0.0–0.4)
EOS: 1 %
HEMOGLOBIN: 13.6 g/dL (ref 11.1–15.9)
Hematocrit: 41.5 % (ref 34.0–46.6)
IMMATURE GRANULOCYTES: 0 %
Immature Grans (Abs): 0 10*3/uL (ref 0.0–0.1)
LYMPHS ABS: 0.9 10*3/uL (ref 0.7–3.1)
Lymphs: 11 %
MCH: 30.7 pg (ref 26.6–33.0)
MCHC: 32.8 g/dL (ref 31.5–35.7)
MCV: 94 fL (ref 79–97)
MONOCYTES: 6 %
Monocytes Absolute: 0.5 10*3/uL (ref 0.1–0.9)
NEUTROS PCT: 81 %
Neutrophils Absolute: 6.1 10*3/uL (ref 1.4–7.0)
Platelets: 226 10*3/uL (ref 150–450)
RBC: 4.43 x10E6/uL (ref 3.77–5.28)
RDW: 14.1 % (ref 12.3–15.4)
WBC: 7.6 10*3/uL (ref 3.4–10.8)

## 2018-04-16 LAB — CMP14 + ANION GAP
ALBUMIN: 4.2 g/dL (ref 3.6–4.8)
ALT: 29 IU/L (ref 0–32)
ANION GAP: 18 mmol/L (ref 10.0–18.0)
AST: 30 IU/L (ref 0–40)
Albumin/Globulin Ratio: 1.6 (ref 1.2–2.2)
Alkaline Phosphatase: 96 IU/L (ref 39–117)
BUN / CREAT RATIO: 13 (ref 12–28)
BUN: 15 mg/dL (ref 8–27)
Bilirubin Total: 0.3 mg/dL (ref 0.0–1.2)
CALCIUM: 9 mg/dL (ref 8.7–10.3)
CO2: 23 mmol/L (ref 20–29)
CREATININE: 1.17 mg/dL — AB (ref 0.57–1.00)
Chloride: 103 mmol/L (ref 96–106)
GFR calc Af Amer: 59 mL/min/{1.73_m2} — ABNORMAL LOW (ref >59)
GFR, EST NON AFRICAN AMERICAN: 51 mL/min/{1.73_m2} — AB (ref >59)
GLOBULIN, TOTAL: 2.6 g/dL (ref 1.5–4.5)
Glucose: 94 mg/dL (ref 65–99)
Potassium: 4.9 mmol/L (ref 3.5–5.2)
Sodium: 144 mmol/L (ref 134–144)
Total Protein: 6.8 g/dL (ref 6.0–8.5)

## 2018-04-16 LAB — TSH: TSH: 2.74 u[IU]/mL (ref 0.450–4.500)

## 2018-04-16 LAB — VITAMIN D 25 HYDROXY (VIT D DEFICIENCY, FRACTURES): Vit D, 25-Hydroxy: 31.1 ng/mL (ref 30.0–100.0)

## 2018-04-16 LAB — LIPID PANEL
CHOLESTEROL TOTAL: 160 mg/dL (ref 100–199)
Chol/HDL Ratio: 2.5 ratio (ref 0.0–4.4)
HDL: 63 mg/dL (ref >39)
LDL CALC: 81 mg/dL (ref 0–99)
TRIGLYCERIDES: 78 mg/dL (ref 0–149)
VLDL Cholesterol Cal: 16 mg/dL (ref 5–40)

## 2018-04-16 NOTE — Progress Notes (Signed)
Lab results from 04-15-18 faxed to [name], [place] at [place]  [phone]  At 09:43 on 04-16-18  Maryan Rued, PBT Clinic Lab 04-16-18 10:27

## 2018-04-17 NOTE — Progress Notes (Signed)
Internal Medicine Clinic Attending  I saw and evaluated the patient.  I personally confirmed the key portions of the history and exam documented by Dr. Masoudi and I reviewed pertinent patient test results.  The assessment, diagnosis, and plan were formulated together and I agree with the documentation in the resident's note. 

## 2018-05-21 ENCOUNTER — Encounter: Payer: Self-pay | Admitting: Internal Medicine

## 2018-05-23 ENCOUNTER — Encounter (HOSPITAL_COMMUNITY): Payer: Self-pay | Admitting: *Deleted

## 2018-05-23 ENCOUNTER — Other Ambulatory Visit: Payer: Self-pay

## 2018-05-23 ENCOUNTER — Emergency Department (HOSPITAL_COMMUNITY): Payer: Medicare Other

## 2018-05-23 ENCOUNTER — Emergency Department (HOSPITAL_COMMUNITY)
Admission: EM | Admit: 2018-05-23 | Discharge: 2018-05-23 | Disposition: A | Discharge status: Home / Self Care | Payer: Medicare Other | Attending: Emergency Medicine | Admitting: Emergency Medicine

## 2018-05-23 DIAGNOSIS — E785 Hyperlipidemia, unspecified: Secondary | ICD-10-CM | POA: Insufficient documentation

## 2018-05-23 DIAGNOSIS — F0390 Unspecified dementia without behavioral disturbance: Secondary | ICD-10-CM | POA: Diagnosis not present

## 2018-05-23 DIAGNOSIS — I959 Hypotension, unspecified: Secondary | ICD-10-CM | POA: Diagnosis not present

## 2018-05-23 DIAGNOSIS — Z8673 Personal history of transient ischemic attack (TIA), and cerebral infarction without residual deficits: Secondary | ICD-10-CM | POA: Insufficient documentation

## 2018-05-23 DIAGNOSIS — R55 Syncope and collapse: Secondary | ICD-10-CM | POA: Insufficient documentation

## 2018-05-23 DIAGNOSIS — E039 Hypothyroidism, unspecified: Secondary | ICD-10-CM | POA: Insufficient documentation

## 2018-05-23 DIAGNOSIS — Z79899 Other long term (current) drug therapy: Secondary | ICD-10-CM | POA: Diagnosis not present

## 2018-05-23 DIAGNOSIS — Z7982 Long term (current) use of aspirin: Secondary | ICD-10-CM | POA: Insufficient documentation

## 2018-05-23 DIAGNOSIS — Q909 Down syndrome, unspecified: Secondary | ICD-10-CM | POA: Insufficient documentation

## 2018-05-23 DIAGNOSIS — I10 Essential (primary) hypertension: Secondary | ICD-10-CM | POA: Diagnosis not present

## 2018-05-23 DIAGNOSIS — R4189 Other symptoms and signs involving cognitive functions and awareness: Secondary | ICD-10-CM

## 2018-05-23 DIAGNOSIS — R569 Unspecified convulsions: Secondary | ICD-10-CM | POA: Diagnosis not present

## 2018-05-23 DIAGNOSIS — R404 Transient alteration of awareness: Secondary | ICD-10-CM | POA: Diagnosis not present

## 2018-05-23 DIAGNOSIS — Z87798 Personal history of other (corrected) congenital malformations: Secondary | ICD-10-CM | POA: Diagnosis not present

## 2018-05-23 LAB — BASIC METABOLIC PANEL
ANION GAP: 9 (ref 5–15)
BUN: 18 mg/dL (ref 6–20)
CO2: 25 mmol/L (ref 22–32)
Calcium: 9 mg/dL (ref 8.9–10.3)
Chloride: 107 mmol/L (ref 98–111)
Creatinine, Ser: 1.1 mg/dL — ABNORMAL HIGH (ref 0.44–1.00)
GFR calc non Af Amer: 55 mL/min — ABNORMAL LOW (ref >60)
Glucose, Bld: 102 mg/dL — ABNORMAL HIGH (ref 70–99)
POTASSIUM: 4.2 mmol/L (ref 3.5–5.1)
Sodium: 141 mmol/L (ref 135–145)

## 2018-05-23 LAB — CBC
HCT: 40.7 % (ref 36.0–46.0)
Hemoglobin: 13.1 g/dL (ref 12.0–15.0)
MCH: 30 pg (ref 26.0–34.0)
MCHC: 32.2 g/dL (ref 30.0–36.0)
MCV: 93.1 fL (ref 80.0–100.0)
Platelets: 172 10*3/uL (ref 150–400)
RBC: 4.37 MIL/uL (ref 3.87–5.11)
RDW: 14.8 % (ref 11.5–15.5)
WBC: 4.9 10*3/uL (ref 4.0–10.5)
nRBC: 0 % (ref 0.0–0.2)

## 2018-05-23 LAB — CBG MONITORING, ED: Glucose-Capillary: 118 mg/dL — ABNORMAL HIGH (ref 70–99)

## 2018-05-23 NOTE — ED Notes (Signed)
Pt returned from ct

## 2018-05-23 NOTE — ED Triage Notes (Signed)
The pt arrived by gems from home  The pt has a caregiver and the caregiver is on the way here  The pt is sl drowsy and co-operative  Mouth damage no incontinence   Iv per gems

## 2018-05-23 NOTE — Discharge Instructions (Signed)
It was our pleasure to provide your ER care today - we hope that you feel better.  Follow up with your doctor/neurologist in the next 1-2 weeks.  Return to ER if worse, new symptoms, fevers, trouble breathing, recurrent unresponsive episodes, other concern.

## 2018-05-23 NOTE — ED Provider Notes (Signed)
[place] [facility] EMERGENCY DEPARTMENT Provider Note   CSN: [id number] Arrival date & time: 05/23/18  6270     History   Chief Complaint Chief Complaint  Patient presents with  . Seizures    HPI Amanda Castillo is a 62 y.o. female.  Patient with hx Down's syndrome, presents s/p possible seizure vs syncopal event. Caregiver was getting out of bed to chair, when they sat patient up on side of bed, pt appeared to look/stare back, and then seemed tremulous, bit tongue. No fall to floor or trauma w event. No incontinence. Lasted a minute or so. Patients mental status has been c/w baseline all day. No recent/new complaint. No fevers. Has been eating/drinking per normal. Normal/baseline bowel and bladder habits. At patients baseline, she does not stand or walk on own - can assist with transferring into chair/wheelchair. Wears pull-ups/adult diaper at baseline. Level 5 caveat - pt does not respond to questions asked/dementia/Downs.   The history is provided by the patient, the EMS personnel and a caregiver. The history is limited by the condition of the patient.  Seizures      Past Medical History:  Diagnosis Date  . Allergy   . Depression   . Down's syndrome   . Hallucinations   . Hyperlipidemia   . Hypothyroidism   . Moderate intellectual disability   . Orthostatic hypotension 06/06/2015  . PVD (peripheral vascular disease) ([place])   . Sleep apnea   . Stroke [facility])    in 2006    Patient Active Problem List   Diagnosis Date Noted  . Osteopenia 06/23/2017  . Incontinence of urine in female 06/23/2017  . Vitamin D deficiency 03/19/2017  . At high risk for falls 03/19/2017  . Candidal intertrigo 08/28/2016  . Allergic rhinitis 02/28/2016  . Health care maintenance 07/27/2015  . Sinus bradycardia   . Dementia associated with other underlying disease with behavioral disturbance ([place]) 05/18/2015  . Down syndrome 03/24/2014  . Hyperlipidemia 03/24/2014  .  Hypothyroidism 03/24/2014  . Sleep apnea 03/24/2014    Past Surgical History:  Procedure Laterality Date  . COLONOSCOPY  06/12/2012  . orif lt foot    . TONSILLECTOMY       OB History   No obstetric history on file.      Home Medications    Prior to Admission medications   Medication Sig Start Date End Date Taking? Authorizing Provider  aspirin 81 MG tablet Take 1 tablet (81 mg total) by mouth daily. 04/15/18   Masoudi, Dorthula Rue, MD  Calcium Carbonate-Vit D-Min (CALCIUM 1200) 1200-1000 MG-UNIT CHEW Chew 1 tablet by mouth daily. 04/15/18   Masoudi, Dorthula Rue, MD  docusate sodium (COLACE) 100 MG capsule Take 1 capsule (100 mg total) by mouth 2 (two) times daily. 04/15/18   Masoudi, Dorthula Rue, MD  Doxepin HCl (SILENOR) 6 MG TABS Take 1 tablet (6 mg total) by mouth at bedtime. 04/15/18   Masoudi, Elhamalsadat, MD  feeding supplement, ENSURE ENLIVE, (ENSURE ENLIVE) LIQD Take 237 mLs by mouth 3 (three) times daily between meals. 07/10/17   Colbert Ewing, MD  hydrocortisone cream 1 % Apply to affected area at toes 2 times daily. Discontinue when redness resolved. 04/15/18 04/15/19  Masoudi, Dorthula Rue, MD  Iloperidone (FANAPT) 6 MG TABS Take 1 tablet (6 mg total) by mouth at bedtime. 04/15/18   Masoudi, Dorthula Rue, MD  levothyroxine (SYNTHROID, LEVOTHROID) 125 MCG tablet Take 0.5 tablets (62.5 mcg total) by mouth daily before breakfast. 04/15/18   Dewayne Hatch, MD  loratadine (CLARITIN) 10 MG tablet Take 1 tablet (10 mg total) by mouth daily. 04/15/18   Masoudi, Elhamalsadat, MD  nystatin (NYSTATIN) powder Apply topically 4 (four) times daily. Apply on the affected area under the breast. Can discontinue when clears. 04/15/18   Masoudi, Elhamalsadat, MD  Propylene Glycol (SYSTANE BALANCE OP) Apply 1 drop to eye 2 (two) times daily as needed.    [provider]  simvastatin (ZOCOR) 40 MG tablet Take 1 tablet (40 mg total) by mouth at bedtime. At bedtime 04/15/18   Masoudi,  Dorthula Rue, MD  sodium chloride (OCEAN) 0.65 % SOLN nasal spray Place 1 spray into both nostrils at bedtime as needed for congestion.     [provider]  terbinafine (LAMISIL) 1 % cream Apply 1 application topically 2 (two) times daily as needed.    [provider]    Family History Family History  Problem Relation Age of Onset  . Colon cancer Paternal Uncle   . Colon cancer Maternal Grandfather     Social History Social History   Tobacco Use  . Smoking status: Never Smoker  . Smokeless tobacco: Never Used  Substance Use Topics  . Alcohol use: No    Alcohol/week: 0.0 standard drinks  . Drug use: No     Allergies   Patient has no known allergies.   Review of Systems Review of Systems  Unable to perform ROS: Dementia  Neurological: Positive for seizures.  level 5 caveat - not verbally responsive to ros questions.    Physical Exam Updated Vital Signs BP (!) 124/59   Pulse 77   Temp (!) 95.3 F (35.2 C) (Rectal)   Resp 18   Ht 1.575 m (5\' 2" )   Wt 64.7 kg   SpO2 99%   BMI 26.09 kg/m   Physical Exam Vitals signs and nursing note reviewed.  Constitutional:      Appearance: Normal appearance. She is well-developed.  HENT:     Head: Atraumatic.     Nose: Nose normal.     Mouth/Throat:     Comments: Contusion to edge of tongue.  Eyes:     General: No scleral icterus.    Conjunctiva/sclera: Conjunctivae normal.     Pupils: Pupils are equal, round, and reactive to light.  Neck:     Musculoskeletal: Neck supple. No neck rigidity or muscular tenderness.     Vascular: No carotid bruit.     Trachea: No tracheal deviation.  Cardiovascular:     Rate and Rhythm: Normal rate and regular rhythm.     Pulses: Normal pulses.     Heart sounds: Normal heart sounds. No murmur. No friction rub. No gallop.   Pulmonary:     Effort: Pulmonary effort is normal. No respiratory distress.     Breath sounds: Normal breath sounds.  Chest:     Chest wall: No  tenderness.  Abdominal:     General: Bowel sounds are normal. There is no distension.     Palpations: Abdomen is soft.     Tenderness: There is no abdominal tenderness.  Genitourinary:    Comments: No cva tenderness.  Musculoskeletal:     Comments: CTLS spine, non tender, aligned, no step off. Good passive rom bil ext, no focal bony tenderness or deformity noted.   Skin:    General: Skin is warm and dry.     Findings: No rash.  Neurological:     General: No focal deficit present.     Mental Status: She is  alert. Mental status is at baseline.  Psychiatric:        Behavior: Behavior normal.      ED Treatments / Results  Labs (all labs ordered are listed, but only abnormal results are displayed) Results for orders placed or performed during the hospital encounter of 04/05/24  Basic metabolic panel  Result Value Ref Range   Sodium 141 135 - 145 mmol/L   Potassium 4.2 3.5 - 5.1 mmol/L   Chloride 107 98 - 111 mmol/L   CO2 25 22 - 32 mmol/L   Glucose, Bld 102 (H) 70 - 99 mg/dL   BUN 18 6 - 20 mg/dL   Creatinine, Ser 1.10 (H) 0.44 - 1.00 mg/dL   Calcium 9.0 8.9 - 10.3 mg/dL   GFR calc non Af Amer 55 (L) >60 mL/min   GFR calc Af Amer >60 >60 mL/min   Anion gap 9 5 - 15  CBC  Result Value Ref Range   WBC 4.9 4.0 - 10.5 K/uL   RBC 4.37 3.87 - 5.11 MIL/uL   Hemoglobin 13.1 12.0 - 15.0 g/dL   HCT 40.7 36.0 - 46.0 %   MCV 93.1 80.0 - 100.0 fL   MCH 30.0 26.0 - 34.0 pg   MCHC 32.2 30.0 - 36.0 g/dL   RDW 14.8 11.5 - 15.5 %   Platelets 172 150 - 400 K/uL   nRBC 0.0 0.0 - 0.2 %  CBG monitoring, ED  Result Value Ref Range   Glucose-Capillary 118 (H) 70 - 99 mg/dL   Ct Head Wo Contrast  Result Date: 05/23/2018 CLINICAL DATA:  Seizure.  History of down syndrome. EXAM: CT HEAD WITHOUT CONTRAST TECHNIQUE: Contiguous axial images were obtained from the base of the skull through the vertex without intravenous contrast. COMPARISON:  06/06/2015 FINDINGS: Brain: Chronic left frontal  lobe encephalomalacia. Stable mild ventriculomegaly including the temporal horn dilatation. The cerebellum cerebral peduncles appear normal. No intracranial hemorrhage, mass lesion, or acute CVA. Vascular: Unremarkable Skull: Unremarkable Sinuses/Orbits: Small left mastoid effusion. Somewhat hypoplastic right mastoid air cells. Aplastic frontal sinuses. Other: No supplemental non-categorized findings. IMPRESSION: 1. No acute intracranial findings. 2. Chronic left frontal lobe encephalomalacia. Chronic mild ventriculomegaly. 3. Small left mastoid effusion. Electronically Signed   By: Van Clines M.D.   On: 05/23/2018 19:59    EKG EKG Interpretation  Date/Time:  Saturday May 23 2018 18:56:46 EST Ventricular Rate:  77 PR Interval:    QRS Duration: 83 QT Interval:  389 QTC Calculation: 441 R Axis:   92 Text Interpretation:  Atrial fibrillation Right axis deviation Non-specific ST-t changes Confirmed by Lajean Saver [phone]) on 05/23/2018 7:06:50 PM   Radiology Ct Head Wo Contrast  Result Date: 05/23/2018 CLINICAL DATA:  Seizure.  History of down syndrome. EXAM: CT HEAD WITHOUT CONTRAST TECHNIQUE: Contiguous axial images were obtained from the base of the skull through the vertex without intravenous contrast. COMPARISON:  06/06/2015 FINDINGS: Brain: Chronic left frontal lobe encephalomalacia. Stable mild ventriculomegaly including the temporal horn dilatation. The cerebellum cerebral peduncles appear normal. No intracranial hemorrhage, mass lesion, or acute CVA. Vascular: Unremarkable Skull: Unremarkable Sinuses/Orbits: Small left mastoid effusion. Somewhat hypoplastic right mastoid air cells. Aplastic frontal sinuses. Other: No supplemental non-categorized findings. IMPRESSION: 1. No acute intracranial findings. 2. Chronic left frontal lobe encephalomalacia. Chronic mild ventriculomegaly. 3. Small left mastoid effusion. Electronically Signed   By: Van Clines M.D.   On: 05/23/2018  19:59    Procedures Procedures (including critical care time)  Medications Ordered in ED  Medications - No data to display   Initial Impression / Assessment and Plan / ED Course  I have reviewed the triage vital signs and the nursing notes.  Pertinent labs & imaging results that were available during my care of the patient were reviewed by me and considered in my medical decision making (see chart for details).  Iv ns. Continuous pulse ox and monitor. Seizure precautions.  Reviewed nursing notes and prior charts for additional history. On review prior visits, pt is noted with multiple evals, neurology, cardiology, ER for unresponsive episodes - from prior visits it appears consensus is vasovagal syncope, prior eegs negative.   Labs sent.   Labs reviewed - chem normal.   Ct reviewed - neg acute.  Pts mental status remains c/w baseline. bp normal. No seizure activity.  Discussed results w pt/gaurdian.   Pt currently appears at baseline, in no distress, and stable for d/c.   Given symptoms coincided with sitting upright, and prior episodes/workups, feel most c/w vasovagal type episode.     Final Clinical Impressions(s) / ED Diagnoses   Final diagnoses:  None    ED Discharge Orders    None       Lajean Saver, MD 05/23/18 2056

## 2018-05-26 ENCOUNTER — Telehealth: Payer: Self-pay | Admitting: Internal Medicine

## 2018-05-26 NOTE — Telephone Encounter (Signed)
LMOM for  the Caregiver [name] to pick up a letter that has been written for the patient's [employer].

## 2018-06-01 ENCOUNTER — Encounter: Payer: Self-pay | Admitting: Internal Medicine

## 2018-06-01 ENCOUNTER — Ambulatory Visit (INDEPENDENT_AMBULATORY_CARE_PROVIDER_SITE_OTHER): Payer: Medicare Other | Admitting: Internal Medicine

## 2018-06-01 ENCOUNTER — Other Ambulatory Visit: Payer: Self-pay

## 2018-06-01 VITALS — BP 113/50 | HR 71 | Temp 97.4°F

## 2018-06-01 DIAGNOSIS — F0281 Dementia in other diseases classified elsewhere with behavioral disturbance: Secondary | ICD-10-CM

## 2018-06-01 DIAGNOSIS — Z993 Dependence on wheelchair: Secondary | ICD-10-CM | POA: Diagnosis not present

## 2018-06-01 DIAGNOSIS — Z9181 History of falling: Secondary | ICD-10-CM | POA: Diagnosis not present

## 2018-06-01 DIAGNOSIS — F02818 Dementia in other diseases classified elsewhere, unspecified severity, with other behavioral disturbance: Secondary | ICD-10-CM

## 2018-06-01 DIAGNOSIS — Z79899 Other long term (current) drug therapy: Secondary | ICD-10-CM

## 2018-06-01 MED ORDER — LEVETIRACETAM 500 MG PO TABS: 500.0000 mg | ORAL_TABLET | Freq: Two times a day (BID) | ORAL | 2 refills | Status: AC

## 2018-06-01 NOTE — Progress Notes (Deleted)
Episode of unresponsiveness  Patient is here for follow-up after an ED visit for unresponsive episode.  During transfer from bed to chair while at home she was noticed to be staring off, seemed tremulous and noticed to bite her tongue. Upon arrival to the ED she was found to be at her baseline mental status. A CT head was obtained and showed no acute abnormality.  BMP and CBC were unremarkable.  EKG showed sinus rhythm, right axis deviation consistent with prior.  She has no history of seizures.

## 2018-06-01 NOTE — Patient Instructions (Addendum)
Thank you for visiting clinic today. Because of the tongue bite during her recent episode of losing some consciousness we are concerned about seizure-like activity and she is high risk, I am starting her on a low-dose antiseizure medicine called Keppra.  You can either started now or if want to hold for another episode.  Also make an follow-up appointment with her neurologist for further discuss this episode and tongue biting. I did order a hospital bed, incontinence supplies and physical therapy to help her with these frequent falls. Your physical therapist should be able to measure you for a proper size of wheelchair. Please follow-up with your PCP in about 3 months.

## 2018-06-01 NOTE — Progress Notes (Signed)
Internal Medicine Clinic Attending  Case discussed with Dr. Amin at the time of the visit.  We reviewed the resident's history and exam and pertinent patient test results.  I agree with the assessment, diagnosis, and plan of care documented in the resident's note.    

## 2018-06-01 NOTE — Progress Notes (Addendum)
Established Patient Office Visit  Subjective:  Patient ID: Amanda Castillo, female    DOB: 12/03/1955  Age: 62 y.o. MRN: [id number]  CC: For follow-up of her ED visit on May 23, 2018.  Chief Complaint  Patient presents with  . Follow-up  . Fall    HPI Amanda Castillo 62 year old lady with past medical history as listed belowwith past medical history as listed below came to the clinic for follow-up of her recent ED visit on May 23, 2018. Patient was taken to ED after caregiver noticing some staring and tongue biting with some tremulous movements while making her sit on the side of the bed for less than 1 minute.  Patient was at baseline when presented to ED.  Labs and CT head was unremarkable. Patient has an history of other ED visits for similar symptoms and was evaluated by neurologist and cardiologist and thought to have some vagovagal syncopal episodes.  Patient did not had any more syncopal episode but found to be on the floor next to bed on 2 consecutive mornings after the ED visit, on December 16 and 17.  Caregiver denies any injuries or loss of consciousness.  Patient was found on floor in the morning, looks like she was trying to get out of bed and had a fall.  Patient is wheelchair-bound and dependent on caregivers for all her ADLs.  Caregiver was asking for an hospital bed with rails to prevent further falls.  She was also asking for more incontinence supplies which was provided.  Please see assessment and plan for her chronic conditions.  -Patient needs hospital bed and in order was placed. Patient requires re-positioning of the body in ways that cannot be achieved with an ordinary bed or wedge pillow, to eliminate pain, reduce pressure, or treat existence of pressure sores.    Patient requires the head of the bed to be elevated at least 30 degrees or more most of the time.    Patient requires frequent / immediate changes in body positions which cannot  be achieved with a normal bed.    Past Medical History:  Diagnosis Date  . Allergy   . Depression   . Down's syndrome   . Hallucinations   . Hyperlipidemia   . Hypothyroidism   . Moderate intellectual disability   . Orthostatic hypotension 06/06/2015  . PVD (peripheral vascular disease) ([place])   . Sleep apnea   . Stroke [facility])    in 2006    Past Surgical History:  Procedure Laterality Date  . COLONOSCOPY  06/12/2012  . orif lt foot    . TONSILLECTOMY      Family History  Problem Relation Age of Onset  . Colon cancer Paternal Uncle   . Colon cancer Maternal Grandfather     Social History   Socioeconomic History  . Marital status: Single    Spouse name: Not on file  . Number of children: Not on file  . Years of education: Not on file  . Highest education level: Not on file  Occupational History  . Not on file  Social Needs  . Financial resource strain: Not on file  . Food insecurity:    Worry: Not on file    Inability: Not on file  . Transportation needs:    Medical: Not on file    Non-medical: Not on file  Tobacco Use  . Smoking status: Never Smoker  . Smokeless tobacco: Never Used  Substance and Sexual Activity  .  Alcohol use: No    Alcohol/week: 0.0 standard drinks  . Drug use: No  . Sexual activity: Never  Lifestyle  . Physical activity:    Days per week: Not on file    Minutes per session: Not on file  . Stress: Not on file  Relationships  . Social connections:    Talks on phone: Not on file    Gets together: Not on file    Attends religious service: Not on file    Active member of club or organization: Not on file    Attends meetings of clubs or organizations: Not on file    Relationship status: Not on file  . Intimate partner violence:    Fear of current or ex partner: Not on file    Emotionally abused: Not on file    Physically abused: Not on file    Forced sexual activity: Not on file  Other Topics Concern  . Not on file  Social History  Narrative  . Not on file    Outpatient Medications Prior to Visit  Medication Sig Dispense Refill  . aspirin 81 MG tablet Take 1 tablet (81 mg total) by mouth daily. 30 tablet 5  . Calcium Carbonate-Vit D-Min (CALCIUM 1200) 1200-1000 MG-UNIT CHEW Chew 1 tablet by mouth daily. 90 each 1  . docusate sodium (COLACE) 100 MG capsule Take 1 capsule (100 mg total) by mouth 2 (two) times daily. 60 capsule   . Doxepin HCl (SILENOR) 6 MG TABS Take 1 tablet (6 mg total) by mouth at bedtime. 30 tablet 2  . feeding supplement, ENSURE ENLIVE, (ENSURE ENLIVE) LIQD Take 237 mLs by mouth 3 (three) times daily between meals. 237 mL 12  . hydrocortisone cream 1 % Apply to affected area at toes 2 times daily. Discontinue when redness resolved. (Patient not taking: Reported on 05/23/2018) 30 g 1  . Iloperidone (FANAPT) 6 MG TABS Take 1 tablet (6 mg total) by mouth at bedtime. 30 tablet 2  . levothyroxine (SYNTHROID, LEVOTHROID) 125 MCG tablet Take 0.5 tablets (62.5 mcg total) by mouth daily before breakfast. 90 tablet 1  . loratadine (CLARITIN) 10 MG tablet Take 1 tablet (10 mg total) by mouth daily. 90 tablet 3  . nystatin (NYSTATIN) powder Apply topically 4 (four) times daily. Apply on the affected area under the breast. Can discontinue when clears. (Patient not taking: Reported on 05/23/2018) 15 g 0  . Propylene Glycol (SYSTANE BALANCE OP) Apply 1 drop to eye 2 (two) times daily as needed.    . simvastatin (ZOCOR) 40 MG tablet Take 1 tablet (40 mg total) by mouth at bedtime. At bedtime 90 tablet 3  . sodium chloride (OCEAN) 0.65 % SOLN nasal spray Place 1 spray into both nostrils at bedtime as needed for congestion.     . terbinafine (LAMISIL) 1 % cream Apply 1 application topically 2 (two) times daily as needed.     No facility-administered medications prior to visit.     No Known Allergies  ROS Review of Systems  Negative except mentioned in HPI.   Objective:    Physical Exam  BP (!) 113/50 (BP  Location: Right Arm, Patient Position: Sitting, Cuff Size: Normal)   Pulse 71   Temp (!) 97.4 F (36.3 C) (Oral)   SpO2 100%  Wt Readings from Last 3 Encounters:  05/23/18 142 lb 10.2 oz (64.7 kg)  07/03/17 142 lb 9.6 oz (64.7 kg)  06/23/17 142 lb 1.6 oz (64.5 kg)    General:  Vital signs reviewed.  Patient is well-developed and well-nourished, wheelchair-bound pleasant lady, in no acute distress and cooperative with exam.  Head: Normocephalic and atraumatic. Eyes: EOMI, conjunctivae normal, no scleral icterus.  Cardiovascular: RRR, S1 normal, S2 normal, no murmurs, gallops, or rubs. Pulmonary/Chest: Clear to auscultation bilaterally, no wheezes, rales, or rhonchi. Abdominal: Soft, non-tender, non-distended, BS +, no masses, organomegaly, or guarding present.  Extremities: No lower extremity edema bilaterally,  pulses symmetric and intact bilaterally. No cyanosis or clubbing. Neurological: Alert and oriented to self only, Strength is normal and symmetric bilaterally, cranial nerve II-XII are grossly intact, no focal motor deficit, sensory intact to light touch bilaterally.  Skin: Warm, dry and intact. No rashes or erythema. Psychiatric: Normal mood and affect. speech and behavior is normal. Cognition and memory are normal.   Lab Results  Component Value Date   TSH 2.740 04/15/2018   Lab Results  Component Value Date   WBC 4.9 05/23/2018   HGB 13.1 05/23/2018   HCT 40.7 05/23/2018   MCV 93.1 05/23/2018   PLT 172 05/23/2018   Lab Results  Component Value Date   NA 141 05/23/2018   K 4.2 05/23/2018   CO2 25 05/23/2018   GLUCOSE 102 (H) 05/23/2018   BUN 18 05/23/2018   CREATININE 1.10 (H) 05/23/2018   BILITOT 0.3 04/15/2018   ALKPHOS 96 04/15/2018   AST 30 04/15/2018   ALT 29 04/15/2018   PROT 6.8 04/15/2018   ALBUMIN 4.2 04/15/2018   CALCIUM 9.0 05/23/2018   ANIONGAP 9 05/23/2018   Lab Results  Component Value Date   CHOL 160 04/15/2018   Lab Results  Component  Value Date   HDL 63 04/15/2018   Lab Results  Component Value Date   LDLCALC 81 04/15/2018   Lab Results  Component Value Date   TRIG 78 04/15/2018   Lab Results  Component Value Date   CHOLHDL 2.5 04/15/2018   No results found for: HGBA1C    Assessment & Plan:   Problem List Items Addressed This Visit      Nervous and Auditory   Dementia associated with other underlying disease with behavioral disturbance (HCC)   Relevant Medications   levETIRAcetam (KEPPRA) 500 MG tablet   Other Relevant Orders   Incontinence supply     Other   At high risk for falls - Primary   Relevant Orders   DME Hospital bed   Ambulatory referral to Physical Therapy      Meds ordered this encounter  Medications  . levETIRAcetam (KEPPRA) 500 MG tablet    Sig: Take 1 tablet (500 mg total) by mouth 2 (two) times daily.    Dispense:  60 tablet    Refill:  2    Follow-up: Return in about 3 months (around 08/31/2018) for PCP.    Lorella Nimrod, MD   Assessment & Plan:   See Encounters Tab for problem based charting.  Patient discussed with Dr. Evette Doffing.

## 2018-06-01 NOTE — Assessment & Plan Note (Signed)
Patient has dementia and oriented to self only. According to the caregiver this is her baseline. She had a recent ED visit with a concerning syncopal episode/seizure-like activity.  She did had similar episodes but she never had a tongue bite before. Patient is high risk for seizures and tongue biting can be due to a seizure as vasovagal syncopal episode should not have a tongue bite.  Had a discussion with caregiver and started her on Keppra 500 mg twice daily, caregiver will decide whether his dose started right away on wait for another episode. We also requested caregiver to make an appointment with her neurologist to further discuss this episode and continuation about Keppra. She will continue her psych meds.

## 2018-06-01 NOTE — Assessment & Plan Note (Signed)
Patient was found twice on the floor beside her bed recently.  She is high risk for falls. Caregiver and patient both denies any injuries or pain.  -Order for hospital bed with rails were placed. -Ambulatory referral for physical therapy was given because of her recent falls.

## 2018-06-25 ENCOUNTER — Ambulatory Visit: Payer: Medicare Other | Admitting: Physical Therapy

## 2018-06-29 ENCOUNTER — Other Ambulatory Visit: Payer: Self-pay

## 2018-06-29 ENCOUNTER — Ambulatory Visit
Payer: Medicare Other | Attending: Student in an Organized Health Care Education/Training Program | Admitting: Physical Therapy

## 2018-06-29 ENCOUNTER — Encounter: Payer: Self-pay | Admitting: Physical Therapy

## 2018-06-29 DIAGNOSIS — M6281 Muscle weakness (generalized): Secondary | ICD-10-CM | POA: Diagnosis present

## 2018-06-29 DIAGNOSIS — R2681 Unsteadiness on feet: Secondary | ICD-10-CM | POA: Diagnosis present

## 2018-06-29 DIAGNOSIS — R262 Difficulty in walking, not elsewhere classified: Secondary | ICD-10-CM

## 2018-06-29 DIAGNOSIS — R296 Repeated falls: Secondary | ICD-10-CM | POA: Diagnosis present

## 2018-06-30 ENCOUNTER — Telehealth: Payer: Self-pay | Admitting: Physical Therapy

## 2018-06-30 NOTE — Telephone Encounter (Signed)
Hello Dr. Evette Doffing, I evaluated Amanda Castillo yesterday for outpatient PT services.  Her caregiver's goal is for Falan to return to walking short distances at home.  Kaitland currently only owns a standard walker without wheels, which would require her to lift it each time she wanted to take a step forwards, and a 4 wheeled walker with a seat which would not be stable enough for [place] at this time.  I feel she would benefit from the use of a two wheeled walker.  If you agree, will you please enter a DME order into Epic for 2 wheeled RW?  Thank you, Rico Junker, PT, DPT 06/30/18    2:35 PM

## 2018-06-30 NOTE — Telephone Encounter (Signed)
Contacted [employer] [facility] regarding patient's current wheelchair and inappropriate size.  Representative stated that as long as the current chair is in good condition, the patient and caregiver would be able to go to a local [employer] store and have the wheelchair exchanged for a more appropriate size and seat to floor height.  Based on measurements pt will most likely benefit from a lightweight manual wheelchair 18 x 16, 17 seat to floor height to improve fit and ability to assist with transfers and repositioning.  Will discuss with pt's caregiver at next visit.  Rico Junker, PT, DPT 06/30/18    2:42 PM

## 2018-06-30 NOTE — Therapy (Signed)
[facility] [address] [place], [place], [address] Phone: [phone]   Fax:  [phone]  Physical Therapy Evaluation  Patient Details  Name: Amanda Castillo MRN: [id number] Date of Birth: 01-29-1956 Referring Provider (PT): Lorella Nimrod, MD; Axel Filler, MD   Encounter Date: 06/29/2018  PT End of Session - 06/30/18 1415    Visit Number  1    Number of Visits  17    Date for PT Re-Evaluation  08/29/18    Authorization Type  Medicare and Medicaid 10th visit PN    PT Start Time  1447    PT Stop Time  1535    PT Time Calculation (min)  48 min    Activity Tolerance  Patient tolerated treatment well    Behavior During Therapy  [facility] for tasks assessed/performed       Past Medical History:  Diagnosis Date  . Allergy   . Depression   . Down's syndrome   . Hallucinations   . Hyperlipidemia   . Hypothyroidism   . Moderate intellectual disability   . Orthostatic hypotension 06/06/2015  . PVD (peripheral vascular disease) ([place])   . Sleep apnea   . Stroke [facility])    in 2006    Past Surgical History:  Procedure Laterality Date  . COLONOSCOPY  06/12/2012  . orif lt foot    . TONSILLECTOMY      There were no vitals filed for this visit.   Subjective Assessment - 06/29/18 1455    Subjective  Caregiver provided history: pt has intermittent syncope episodes.  One evening pt woke up and sat up but non-responsive, facial droop and drooling, bit her tongue.  Went to ED but did not find any acute abnormalities.  Patient has fallen out of the bed multiple times.  Pt has not walked in a year - at day program assistant did not walk with pt but put her in a wheelchair every day and pt stopped walking until the other day when she stood from her wheelchair and tried to walk with caregivers with RW.      Pertinent History  Down's Syndrome, CVA in 2006, sleep apnea, PVD, orthostatic hypotension, moderate intellectual  disability, hypothyroidism, hyperlipidemia, hallucinations and depression    Limitations  Walking    Diagnostic tests  no acute findings    Patient Stated Goals  To begin walking again    Currently in Pain?  No/denies         [facility] PT Assessment - 06/29/18 1504      Assessment   Medical Diagnosis  Falls, inability to ambulate    Referring Provider (PT)  Lorella Nimrod, MD; Axel Filler, MD    Onset Date/Surgical Date  06/02/19    Prior Therapy  unknown      Precautions   Precautions  Fall    Precaution Comments  Down's Syndrome, CVA in 2006, sleep apnea, PVD, orthostatic hypotension, moderate intellectual disability, hypothyroidism, hyperlipidemia, hallucinations and depression      Balance Screen   Has the patient fallen in the past 6 months  Yes    How many times?  2    Has the patient had a decrease in activity level because of a fear of falling?   Yes    Is the patient reluctant to leave their home because of a fear of falling?   No      Home Film/video editor residence  Living Arrangements  Other (Comment)   alternative living care provider   Type of [place]  One level    [place] - standard;Walker - 4 wheels;Wheelchair - manual;Shower seat    Additional Comments  has requested a hospital bed from physician to prevent falling out of bed      Prior Function   Level of Independence  Needs assistance with ADLs;Needs assistance with transfers;Needs assistance with gait    Vocation  On disability    Leisure  Color, puzzles, UNO, WAR the card game, likes to eat hot fudge sundaes    Comments  Goes to a day program each day      Cognition   Overall Cognitive Status  History of cognitive impairments - at baseline      Observation/Other Assessments   Focus on Therapeutic Outcomes (FOTO)   Not assessed      Sensation   Light Touch  Appears Intact      Posture/Postural  Control   Posture/Postural Control  Postural limitations    Postural Limitations  Rounded Shoulders;Flexed trunk      ROM / Strength   AROM / PROM / Strength  Strength      Strength   Overall Strength  Deficits    Overall Strength Comments  2-3/5 bilaterally, delayed activation      Transfers   Transfers  Sit to Stand;Stand to Sit    Sit to Stand  3: Mod assist;2: Max assist;With upper extremity assist    Number of Reps  Other reps (comment)    Transfer Cueing  Attempted sit > stand from patient's wheelchair with patient using RW for UE support.  Pt able to initiate sit > stand but unable to stand fully upright even with mod-max A of caregiver and therapist.  Pt performed multiple repetitions of this but was not able to stand long enough to initiate taking steps forwards      Ambulation/Gait   Ambulation/Gait  No      Wheelchair Mobility   Wheelchair Mobility  Yes    Wheelchair Propulsion  Other (comment)    Wheelchair Parts Management  Needs assistance    Distance  pt is total A for w/c mobility.  Her current wheelchair width is too small and seat to floor height is too tall.  Pt is missing leg rests making her even more dependent for repositioning in wheelchair.  Measured patient for more appropriate size wheelchair.  Discussed with caregiver that because insurance purchased this wheelchair last year they would likely not purchase another wheelchair.  Therapist to contact DME company to see if they could switch sizes due to pt's increase in weight/size.      Balance   Balance Assessed  Yes      Static Sitting Balance   Static Sitting - Balance Support  Bilateral upper extremity supported;Feet unsupported    Static Sitting - Level of Assistance  5: Stand by assistance      Dynamic Sitting Balance   Dynamic Sitting - Balance Support  Bilateral upper extremity supported;Feet supported    Dynamic Sitting - Level of Assistance  4: Min assist;3: Mod assist      Static Standing  Balance   Static Standing - Balance Support  Bilateral upper extremity supported    Static Standing - Level of Assistance  3: Mod assist;2: Max assist  Objective measurements completed on examination: See above findings.              PT Education - 06/30/18 1414    Education Details  Clinical findings, PT POC, goals for therapy; process for obtaining a larger wheelchair    Person(s) Educated  Patient;Caregiver(s)    Methods  Explanation    Comprehension  Verbalized understanding       PT Short Term Goals - 06/30/18 1421      PT SHORT TERM GOAL #1   Title  Pt will participate in mat evaluation to assess transfers, sitting balance, LE ROM    Time  4    Period  Weeks    Status  New    Target Date  07/30/18      PT SHORT TERM GOAL #2   Title  Pt will initiate supine and seated ROM/strengthening HEP with assistance of caregiver    Time  4    Period  Weeks    Status  New    Target Date  07/30/18      PT SHORT TERM GOAL #3   Title  Pt will perform full sit > stand from wheelchair and will transfer with RW to mat with mod A    Time  4    Period  Weeks    Status  New    Target Date  07/30/18      PT SHORT TERM GOAL #4   Title  Pt will maintain standing to participate in a leisure/recreational activity for 5-8 minutes with min A.    Time  4    Period  Weeks    Status  New    Target Date  07/30/18        PT Long Term Goals - 06/30/18 1427      PT LONG TERM GOAL #1   Title  Pt and caregiver will demonstrate independence with final HEP and recommendations for safe walking    Time  8    Period  Weeks    Status  New    Target Date  08/29/18      PT LONG TERM GOAL #2   Title  Pt will perform sit <> stand from wheelchair and stand pivot with RW with min A    Time  8    Period  Weeks    Status  New    Target Date  08/29/18      PT LONG TERM GOAL #3   Title  Pt will maintain standing at table/counter to participate in  leisure/recreational activities for 10-12 minutes    Time  8    Period  Weeks    Status  New    Target Date  08/29/18      PT LONG TERM GOAL #4   Title  Pt will ambulate x 50' with RW and moderate assistance on indoor, level surfaces    Time  8    Period  Weeks    Status  New    Target Date  08/29/18             Plan - 06/30/18 1417    Clinical Impression Statement  Pt is a 63 year old female referred to Neuro OPPT for evaluation of falls and inability to ambulate x 1 year.  Pt's PMH is significant for the following: Down's Syndrome, CVA in 2006, sleep apnea, PVD, orthostatic hypotension, moderate intellectual disability, hypothyroidism, hyperlipidemia, hallucinations and depression. The following deficits were noted during  pt's exam: impaired LE strength and ROM, impaired balance and postural control, and impaired gait.  Pt currently requires max-total A to stand and transfer from wheelchair to bed/toilet and is unable to ambulate placing her at increased risk for falls. Pt would benefit from skilled PT to address these impairments and functional limitations to decrease burden of care, maximize functional mobility independence and reduce falls risk.    History and Personal Factors relevant to plan of care:  lives with full time caregiver, has ill-fitting wheelchair without leg rests, has not ambulated for a year, syncope vs. seizure episodes (undetermined), multiple falls, Down's Syndrome, CVA in 2006, sleep apnea, PVD, orthostatic hypotension, moderate intellectual disability, hypothyroidism, hyperlipidemia, hallucinations and depression    Clinical Presentation  Evolving    Clinical Presentation due to:  lives with full time caregiver, has ill-fitting wheelchair without leg rests, has not ambulated for a year, syncope vs. seizure episodes (undetermined), multiple falls, Down's Syndrome, CVA in 2006, sleep apnea, PVD, orthostatic hypotension, moderate intellectual disability,  hypothyroidism, hyperlipidemia, hallucinations and depression    Clinical Decision Making  Moderate    Rehab Potential  Fair    Clinical Impairments Affecting Rehab Potential  hasn't ambulated in a year, possible syncope vs. seizure episodes, moderate intellectual disability    PT Frequency  2x / week    PT Duration  8 weeks    PT Treatment/Interventions  ADLs/Self Care Home Management;DME Instruction;Gait training;Stair training;Functional mobility training;Therapeutic activities;Therapeutic exercise;Balance training;Neuromuscular re-education;Patient/family education;Wheelchair mobility training;Passive range of motion    PT Next Visit Plan  If you can get her on the mat-asses LE ROM.  Initiate supine and sitting HEP.  Standing at sink or // bars.    PT Home Exercise Plan  LE ROM and strengthening, standing activities    Recommended Other Services  Use any activities that motivate patient: coloring, puzzles, hot fudge sundaes, UNO, War card game    Consulted and Agree with Plan of Care  Patient;Family member/caregiver    Family Member Consulted  caregiver that pt lives with       Patient will benefit from skilled therapeutic intervention in order to improve the following deficits and impairments:  Decreased activity tolerance, Decreased balance, Decreased endurance, Decreased mobility, Decreased range of motion, Decreased strength, Difficulty walking, Postural dysfunction  Visit Diagnosis: Repeated falls  Muscle weakness (generalized)  Unsteadiness on feet  Difficulty in walking, not elsewhere classified     Problem List Patient Active Problem List   Diagnosis Date Noted  . Osteopenia 06/23/2017  . Incontinence of urine in female 06/23/2017  . Vitamin D deficiency 03/19/2017  . At high risk for falls 03/19/2017  . Candidal intertrigo 08/28/2016  . Allergic rhinitis 02/28/2016  . Health care maintenance 07/27/2015  . Sinus bradycardia   . Dementia associated with other  underlying disease with behavioral disturbance ([place]) 05/18/2015  . Down syndrome 03/24/2014  . Hyperlipidemia 03/24/2014  . Hypothyroidism 03/24/2014  . Sleep apnea 03/24/2014    Rico Junker, PT, DPT 06/30/18    2:30 PM    [facility] [address] [place], [place], [address] Phone: [phone]   Fax:  [phone]  Name: Amanda Castillo MRN: [id number] Date of Birth: 18-Oct-1955

## 2018-07-01 NOTE — Telephone Encounter (Signed)
Forwarding to Dr. Myrtie Hawk. Thank you.

## 2018-07-06 ENCOUNTER — Encounter: Payer: Self-pay | Admitting: Physical Therapy

## 2018-07-06 ENCOUNTER — Other Ambulatory Visit: Payer: Self-pay | Admitting: Internal Medicine

## 2018-07-06 ENCOUNTER — Telehealth: Payer: Self-pay | Admitting: Physical Therapy

## 2018-07-06 ENCOUNTER — Ambulatory Visit: Payer: Medicare Other | Admitting: Physical Therapy

## 2018-07-06 DIAGNOSIS — M6281 Muscle weakness (generalized): Secondary | ICD-10-CM

## 2018-07-06 DIAGNOSIS — R2681 Unsteadiness on feet: Secondary | ICD-10-CM

## 2018-07-06 DIAGNOSIS — R296 Repeated falls: Secondary | ICD-10-CM

## 2018-07-06 DIAGNOSIS — R262 Difficulty in walking, not elsewhere classified: Secondary | ICD-10-CM

## 2018-07-06 DIAGNOSIS — Z9181 History of falling: Secondary | ICD-10-CM

## 2018-07-06 NOTE — Therapy (Signed)
[facility] [address] [place], [place], [address] Phone: [phone]   Fax:  [phone]  Physical Therapy Treatment  Patient Details  Name: Amanda Castillo MRN: [id number] Date of Birth: Sep 14, 1955 Referring Provider (PT): Lorella Nimrod, MD; Axel Filler, MD   Encounter Date: 07/06/2018  PT End of Session - 07/06/18 1211    Visit Number  2    Number of Visits  17    Date for PT Re-Evaluation  08/29/18    Authorization Type  Medicare and Medicaid 10th visit PN    PT Start Time  1015    PT Stop Time  1100    PT Time Calculation (min)  45 min    Equipment Utilized During Treatment  Gait belt    Activity Tolerance  Patient tolerated treatment well    Behavior During Therapy  [facility] for tasks assessed/performed       Past Medical History:  Diagnosis Date  . Allergy   . Depression   . Down's syndrome   . Hallucinations   . Hyperlipidemia   . Hypothyroidism   . Moderate intellectual disability   . Orthostatic hypotension 06/06/2015  . PVD (peripheral vascular disease) ([place])   . Sleep apnea   . Stroke [facility])    in 2006    Past Surgical History:  Procedure Laterality Date  . COLONOSCOPY  06/12/2012  . orif lt foot    . TONSILLECTOMY      There were no vitals filed for this visit.  Subjective Assessment - 07/06/18 1013    Subjective  Her habilatation tech attended PT today with client. She is not aware of any issues since evaluation.     Pertinent History  Down's Syndrome, CVA in 2006, sleep apnea, PVD, orthostatic hypotension, moderate intellectual disability, hypothyroidism, hyperlipidemia, hallucinations and depression    Limitations  Walking    Diagnostic tests  no acute findings    Patient Stated Goals  To begin walking again    Currently in Pain?  No/denies       Habilitation tech from [employer] called primary caregiver on her phone for discussion on information.  PT educated on  recommendation for [employer] R[employer] with fixed 5" wheels.  Caregiver to try to find w/c footrests before taking w/c to [facility] to switch to lower & 18" size. Patient does not propel herself in w/c. If 18" w/c will not fit into all rooms of home, then push rims may need to be removed.   Attempted stand at sink with pt pulling on sink with maxA from PT. Partial stance achieved on 3 attempt but only for 10 seconds. PT assisted with partial stance again while technician switched w/c with 24" bar stool. Pt performed sitting reach activities working on balance without back support. Pt tolerated 5 minutes of sitting on 24" stool with reaching activities. Pt stood from 24" bar stool 2 reps for 10 seconds with modA.  PT attempted scooting transfer to left but pt was not assisting and was leaning backwards with trunk even with PT manual & verbal cues to lean forward.  PT worked on sitting balance facilitating pt to reach forward ~3". Attempted to facilitate trunk rotation but PT not able to get pt to perform.                         PT Education - 07/06/18 1055    Education Details  PT has requested [employer] RW &  [facility] may switch her w/c. Need to try to find footrests prior to taking w/c to [facility]. Even if w/c is not switched out she will need footrests for safety if w/c is lowered.     Person(s) Educated  Other (comment)   Habilitation Tech from day program attended today   Methods  Explanation;Verbal cues;Handout    Comprehension  Verbalized understanding;Need further instruction       PT Short Term Goals - 06/30/18 1421      PT SHORT TERM GOAL #1   Title  Pt will participate in mat evaluation to assess transfers, sitting balance, LE ROM    Time  4    Period  Weeks    Status  New    Target Date  07/30/18      PT SHORT TERM GOAL #2   Title  Pt will initiate supine and seated ROM/strengthening HEP with assistance of caregiver    Time  4    Period  Weeks    Status  New     Target Date  07/30/18      PT SHORT TERM GOAL #3   Title  Pt will perform full sit > stand from wheelchair and will transfer with RW to mat with mod A    Time  4    Period  Weeks    Status  New    Target Date  07/30/18      PT SHORT TERM GOAL #4   Title  Pt will maintain standing to participate in a leisure/recreational activity for 5-8 minutes with min A.    Time  4    Period  Weeks    Status  New    Target Date  07/30/18        PT Long Term Goals - 06/30/18 1427      PT LONG TERM GOAL #1   Title  Pt and caregiver will demonstrate independence with final HEP and recommendations for safe walking    Time  8    Period  Weeks    Status  New    Target Date  08/29/18      PT LONG TERM GOAL #2   Title  Pt will perform sit <> stand from wheelchair and stand pivot with RW with min A    Time  8    Period  Weeks    Status  New    Target Date  08/29/18      PT LONG TERM GOAL #3   Title  Pt will maintain standing at table/counter to participate in leisure/recreational activities for 10-12 minutes    Time  8    Period  Weeks    Status  New    Target Date  08/29/18      PT LONG TERM GOAL #4   Title  Pt will ambulate x 50' with RW and moderate assistance on indoor, level surfaces    Time  8    Period  Weeks    Status  New    Target Date  08/29/18            Plan - 07/06/18 1219    Clinical Impression Statement  Patient's habilitation tech who helps her at day program attended PT today but did call Cordella Register who is caregiver that she lives with. PT educated on equipment recommendations & info found thus far. If w/c is lower to ground then she will need footrests for safety during transport.  Pt  had difficulty with therapeutic activities in PT today, so does not appear safe yet to work on skills outside of PT.     Rehab Potential  Fair    Clinical Impairments Affecting Rehab Potential  hasn't ambulated in a year, possible syncope vs. seizure episodes, moderate  intellectual disability    PT Frequency  2x / week    PT Duration  8 weeks    PT Treatment/Interventions  ADLs/Self Care Home Management;DME Instruction;Gait training;Stair training;Functional mobility training;Therapeutic activities;Therapeutic exercise;Balance training;Neuromuscular re-education;Patient/family education;Wheelchair mobility training;Passive range of motion    PT Next Visit Plan  Work on scooting transfers. If you can get her on the mat-asses LE ROM.  Initiate supine and sitting HEP.  Standing at sink or // bars. May try STEADY    PT Home Exercise Plan  LE ROM and strengthening, standing activities    Consulted and Agree with Plan of Care  Patient;Family member/caregiver    Family Member Consulted  Habilitation tech from day program       Patient will benefit from skilled therapeutic intervention in order to improve the following deficits and impairments:  Decreased activity tolerance, Decreased balance, Decreased endurance, Decreased mobility, Decreased range of motion, Decreased strength, Difficulty walking, Postural dysfunction  Visit Diagnosis: Repeated falls  Muscle weakness (generalized)  Unsteadiness on feet  Difficulty in walking, not elsewhere classified     Problem List Patient Active Problem List   Diagnosis Date Noted  . Osteopenia 06/23/2017  . Incontinence of urine in female 06/23/2017  . Vitamin D deficiency 03/19/2017  . At high risk for falls 03/19/2017  . Candidal intertrigo 08/28/2016  . Allergic rhinitis 02/28/2016  . Health care maintenance 07/27/2015  . Sinus bradycardia   . Dementia associated with other underlying disease with behavioral disturbance ([place]) 05/18/2015  . Down syndrome 03/24/2014  . Hyperlipidemia 03/24/2014  . Hypothyroidism 03/24/2014  . Sleep apnea 03/24/2014    Jamey Reas PT, DPT 07/06/2018, 12:24 PM  [facility] [address] [place], [place],  [address] Phone: [phone]   Fax:  [phone]  Name: SCHWANDA ZIMA MRN: [id number] Date of Birth: 1955/09/05

## 2018-07-06 NOTE — Patient Instructions (Signed)
Asked Dr. Myrtie Hawk for orders for [facility] with fixed 5" wheels  We also contacted [facility] regarding patient's current wheelchair and inappropriate size.  Representative stated that as long as the current chair is in good condition, the patient and caregiver would be able to go to a local [facility] store and have the wheelchair exchanged for a more appropriate size and seat to floor height.  Based on measurements pt will most likely benefit from a lightweight manual wheelchair 18 x 16, 17 seat to floor height to improve fit and ability to assist with transfers and repositioning.

## 2018-07-06 NOTE — Telephone Encounter (Signed)
Hello Dr. Myrtie Hawk, I wanted to follow up on the physical therapy request for a DME order for a 2 wheeled rolling walker for [facility].  Pt only has a standard walker (no wheels) and a 4 wheeled walker at home and both are not appropriate or safe for patient at this time.  PT would like to initiate standing and gait training with two wheeled walker and then progress to 4 wheeled if appropriate.  If you agree, will you please enter a DME order into Epic for a two wheeled RW?  Thank you, Rico Junker, PT, DPT 07/06/18    9:46 AM

## 2018-07-13 ENCOUNTER — Encounter: Payer: Self-pay | Admitting: Rehabilitation

## 2018-07-13 ENCOUNTER — Ambulatory Visit
Payer: Medicare Other | Attending: Student in an Organized Health Care Education/Training Program | Admitting: Rehabilitation

## 2018-07-13 DIAGNOSIS — R2681 Unsteadiness on feet: Secondary | ICD-10-CM | POA: Diagnosis present

## 2018-07-13 DIAGNOSIS — R262 Difficulty in walking, not elsewhere classified: Secondary | ICD-10-CM | POA: Diagnosis present

## 2018-07-13 DIAGNOSIS — M6281 Muscle weakness (generalized): Secondary | ICD-10-CM

## 2018-07-13 DIAGNOSIS — R296 Repeated falls: Secondary | ICD-10-CM | POA: Diagnosis present

## 2018-07-13 NOTE — Therapy (Signed)
[facility] [address] [place], [place], [address] Phone: [phone]   Fax:  [phone]  Physical Therapy Treatment  Patient Details  Name: Amanda Castillo MRN: [id number] Date of Birth: 1955-12-05 Referring Provider (PT): Lorella Nimrod, MD; Axel Filler, MD   Encounter Date: 07/13/2018  PT End of Session - 07/13/18 1550    Visit Number  3    Number of Visits  17    Date for PT Re-Evaluation  08/29/18    Authorization Type  Medicare and Medicaid 10th visit PN    PT Start Time  1315    PT Stop Time  1400    PT Time Calculation (min)  45 min    Equipment Utilized During Treatment  Gait belt    Activity Tolerance  Patient tolerated treatment well    Behavior During Therapy  [facility] for tasks assessed/performed       Past Medical History:  Diagnosis Date  . Allergy   . Depression   . Down's syndrome   . Hallucinations   . Hyperlipidemia   . Hypothyroidism   . Moderate intellectual disability   . Orthostatic hypotension 06/06/2015  . PVD (peripheral vascular disease) ([place])   . Sleep apnea   . Stroke [facility])    in 2006    Past Surgical History:  Procedure Laterality Date  . COLONOSCOPY  06/12/2012  . orif lt foot    . TONSILLECTOMY      There were no vitals filed for this visit.  Subjective Assessment - 07/13/18 1321    Subjective  Habilitation tech attended PT today again.  No changes since last visit.     Pertinent History  Down's Syndrome, CVA in 2006, sleep apnea, PVD, orthostatic hypotension, moderate intellectual disability, hypothyroidism, hyperlipidemia, hallucinations and depression    Limitations  Walking    Diagnostic tests  no acute findings    Patient Stated Goals  To begin walking again    Currently in Pain?  No/denies                       [facility] Adult PT Treatment/Exercise - 07/13/18 1315      Transfers   Transfers  Sit to Stand;Stand to [employer] Transfers     Sit to Stand  2: Max assist;1: +2 Total assist    Sit to Stand Details  Tactile cues for initiation;Manual facilitation for weight shifting;Manual facilitation for placement;Manual facilitation for weight bearing    Sit to Stand Details (indicate cue type and reason)  Attempted several times to have pt stand to stedy with use of elevated mat with PT and habilitation tech assisting.  Pt finally able to get correct hand placement to assist, however demonstrates resistance to standing today and was unable to even initaite standing without total A x 2.  PT attempted to place puzzles/cards in front of pt to motivate her to stand as well as providing simple cues.  Pt continued to be resistant to standing.     Stand Pivot Transfers  1: +1 Total assist    Stand Pivot Transfer Details (indicate cue type and reason)  Had habilitation tech assist with transfers as pt seemed more comfortable having her assist.  Pt continued to be somewhat resistant to moving and requires total A during transfer.       Self-Care   Self-Care  Other Self-Care Comments    Other Self-Care Comments   Note that pt did get  more appropriately sized chair, however cushion is still from smaller w/c and therefore places pressure on thighs.  Educated to call [employer] to see about renting more appropriately sized w/c cushion to prevent skin breakdown.  Caregiver verbalized understanding.       Exercises   Exercises  Other Exercises    Other Exercises   Performed seated LAQs x 10 reps with cues for pt to "kick my hands", seated marching x 10 reps with cues to continue with task, seated hip adduction with ball squeeze, however this was difficult for pt to follow but did do a few reps.  Added all to HEP.  Pt did initiate tossing ball to PT during session, therefore continued with this for core strengthening and unsupported sitting balance-added to HEP.  Ended session with trying to work on forward trunk leaning to prepare for standing/taking  weight onto LEs with having pt "hug" PT to get her to place her hands on PTs shoulders with PT facilitating forward trunk lean.  Performed x 10 reps.           Alternate lifting knees as high as is comfortable, as if marching. Repeat _10__ times each leg. Do _1-2__ sessions per day. Note: If possible place feet on floor.  Copyright  VHI. All rights reserved.   KNEE: Extension, Long Arc Quads - Sitting    Raise leg until knee is straight. _10__ reps per set, 2___ sets per day, _5-7__ days per week  Copyright  VHI. All rights reserved.   Isometric Hip Adduction    With towel rolled between knees (ball, pillow, etc), press thighs together. Hold _5___ seconds while counting out loud. Repeat _10___ times. Do __2__ sessions per day.  http://gt2.exer.us/630   Copyright  VHI. All rights reserved.   Diona Foley toss:  Have her sit on the edge of her wheelchair (place w/c against wall and make sure its locked).  Toss ball to her and have her toss back.  Try and get her to stretch her arms out when tossing.       PT Education - 07/13/18 1549    Education Details  Getting appropriate sized cushion for new w/c, PT to send another request for youth RW.     Person(s) Educated  Patient    Methods  Explanation    Comprehension  Verbalized understanding       PT Short Term Goals - 06/30/18 1421      PT SHORT TERM GOAL #1   Title  Pt will participate in mat evaluation to assess transfers, sitting balance, LE ROM    Time  4    Period  Weeks    Status  New    Target Date  07/30/18      PT SHORT TERM GOAL #2   Title  Pt will initiate supine and seated ROM/strengthening HEP with assistance of caregiver    Time  4    Period  Weeks    Status  New    Target Date  07/30/18      PT SHORT TERM GOAL #3   Title  Pt will perform full sit > stand from wheelchair and will transfer with RW to mat with mod A    Time  4    Period  Weeks    Status  New    Target Date  07/30/18      PT  SHORT TERM GOAL #4   Title  Pt will maintain standing to participate in a leisure/recreational activity  for 5-8 minutes with min A.    Time  4    Period  Weeks    Status  New    Target Date  07/30/18        PT Long Term Goals - 06/30/18 1427      PT LONG TERM GOAL #1   Title  Pt and caregiver will demonstrate independence with final HEP and recommendations for safe walking    Time  8    Period  Weeks    Status  New    Target Date  08/29/18      PT LONG TERM GOAL #2   Title  Pt will perform sit <> stand from wheelchair and stand pivot with RW with min A    Time  8    Period  Weeks    Status  New    Target Date  08/29/18      PT LONG TERM GOAL #3   Title  Pt will maintain standing at table/counter to participate in leisure/recreational activities for 10-12 minutes    Time  8    Period  Weeks    Status  New    Target Date  08/29/18      PT LONG TERM GOAL #4   Title  Pt will ambulate x 50' with RW and moderate assistance on indoor, level surfaces    Time  8    Period  Weeks    Status  New    Target Date  08/29/18            Plan - 07/13/18 1552    Clinical Impression Statement  Attempted to have pt stand with use of stedy today.  Pt able to place hands in correct placement, however did not initiate standing at all during session.  PT/caregiver even provided total assist to see if she would then have any response to assist to stand.  Pt did initiate some sitting exercises during session and these were given to caregiver.     Rehab Potential  Fair    Clinical Impairments Affecting Rehab Potential  hasn't ambulated in a year, possible syncope vs. seizure episodes, moderate intellectual disability    PT Frequency  2x / week    PT Duration  8 weeks    PT Treatment/Interventions  ADLs/Self Care Home Management;DME Instruction;Gait training;Stair training;Functional mobility training;Therapeutic activities;Therapeutic exercise;Balance training;Neuromuscular  re-education;Patient/family education;Wheelchair mobility training;Passive range of motion    PT Next Visit Plan  Work on scooting transfers. If you can get her on the mat-asses LE ROM.  Initiate supine and sitting HEP.  Standing at sink or // bars. May try STEADY for sit > stand and postural control in upright sitting    PT Home Exercise Plan  LE ROM and strengthening, standing activities    Recommended Other Services  Audra, I did not follow up on RW yet as I am not sure she will even be able to stand yet    Consulted and Agree with Plan of Care  Patient;Family member/caregiver    Family Member Consulted  Habilitation tech from day program       Patient will benefit from skilled therapeutic intervention in order to improve the following deficits and impairments:  Decreased activity tolerance, Decreased balance, Decreased endurance, Decreased mobility, Decreased range of motion, Decreased strength, Difficulty walking, Postural dysfunction  Visit Diagnosis: Repeated falls  Muscle weakness (generalized)  Unsteadiness on feet  Difficulty in walking, not elsewhere classified     Problem List  Patient Active Problem List   Diagnosis Date Noted  . Osteopenia 06/23/2017  . Incontinence of urine in female 06/23/2017  . Vitamin D deficiency 03/19/2017  . At high risk for falls 03/19/2017  . Candidal intertrigo 08/28/2016  . Allergic rhinitis 02/28/2016  . Health care maintenance 07/27/2015  . Sinus bradycardia   . Dementia associated with other underlying disease with behavioral disturbance ([place]) 05/18/2015  . Down syndrome 03/24/2014  . Hyperlipidemia 03/24/2014  . Hypothyroidism 03/24/2014  . Sleep apnea 03/24/2014    Cameron Sprang, PT, MPT [facility] [address] [place], [place], [address] Phone: [phone]   Fax:  [phone] 07/13/18, 3:55 PM  Name: SHAUNEE MULKERN MRN: [id number] Date of Birth: 06-06-1956

## 2018-07-13 NOTE — Patient Instructions (Signed)
Marching    Alternate lifting knees as high as is comfortable, as if marching. Repeat _10__ times each leg. Do _1-2__ sessions per day. Note: If possible place feet on floor.  Copyright  VHI. All rights reserved.   KNEE: Extension, Long Arc Quads - Sitting    Raise leg until knee is straight. _10__ reps per set, 2___ sets per day, _5-7__ days per week  Copyright  VHI. All rights reserved.   Isometric Hip Adduction    With towel rolled between knees (ball, pillow, etc), press thighs together. Hold _5___ seconds while counting out loud. Repeat _10___ times. Do __2__ sessions per day.  http://gt2.exer.us/630   Copyright  VHI. All rights reserved.   Amanda Castillo toss:  Have her sit on the edge of her wheelchair (place w/c against wall and make sure its locked).  Toss ball to her and have her toss back.  Try and get her to stretch her arms out when tossing.

## 2018-07-17 ENCOUNTER — Ambulatory Visit: Payer: Medicare Other | Admitting: Physical Therapy

## 2018-07-17 ENCOUNTER — Encounter: Payer: Self-pay | Admitting: Physical Therapy

## 2018-07-17 DIAGNOSIS — R262 Difficulty in walking, not elsewhere classified: Secondary | ICD-10-CM

## 2018-07-17 DIAGNOSIS — M6281 Muscle weakness (generalized): Secondary | ICD-10-CM

## 2018-07-17 DIAGNOSIS — R296 Repeated falls: Secondary | ICD-10-CM

## 2018-07-17 DIAGNOSIS — R2681 Unsteadiness on feet: Secondary | ICD-10-CM

## 2018-07-19 ENCOUNTER — Encounter: Payer: Self-pay | Admitting: Physical Therapy

## 2018-07-19 NOTE — Therapy (Signed)
[facility] [address] [place], [place], [address] Phone: [phone]   Fax:  [phone]  Physical Therapy Treatment  Patient Details  Name: Amanda Castillo MRN: [id number] Date of Birth: 03/08/1956 Referring Provider (PT): Lorella Nimrod, MD; Axel Filler, MD   Encounter Date: 07/17/2018  PT End of Session - 07/19/18 1500    Visit Number  4    Number of Visits  17    Date for PT Re-Evaluation  08/29/18    Authorization Type  Medicare and Medicaid 10th visit PN    PT Start Time  1015    PT Stop Time  1100    PT Time Calculation (min)  45 min    Activity Tolerance  Patient tolerated treatment well    Behavior During Therapy  Anxious       Past Medical History:  Diagnosis Date  . Allergy   . Depression   . Down's syndrome   . Hallucinations   . Hyperlipidemia   . Hypothyroidism   . Moderate intellectual disability   . Orthostatic hypotension 06/06/2015  . PVD (peripheral vascular disease) ([place])   . Sleep apnea   . Stroke [facility])    in 2006    Past Surgical History:  Procedure Laterality Date  . COLONOSCOPY  06/12/2012  . orif lt foot    . TONSILLECTOMY      There were no vitals filed for this visit.      07/17/18 1023  Symptoms/Limitations  Subjective Caregiver's report that pt stood by herself and pivoted to get out of the car on Wednesday.  Hasn't done it since.  Pertinent History Down's Syndrome, CVA in 2006, sleep apnea, PVD, orthostatic hypotension, moderate intellectual disability, hypothyroidism, hyperlipidemia, hallucinations and depression  Limitations Walking  Diagnostic tests no acute findings  Patient Stated Goals To begin walking again  Pain Assessment  Currently in Pain? No/denies                    [facility] Adult PT Treatment/Exercise - 07/19/18 1438      Therapeutic Activites    Therapeutic Activities  Other Therapeutic Activities    Other Therapeutic  Activities  Provided caregivers with order for 2 wheeled rolling walker to take to [facility].  Also discussed with caregivers the current position of patient's new wheelchair.  Back drive wheels positioned in top axle (to lower back of w/c) but front casters were not also lowered resulting in posteriorly tipped wheelchair.  Recommended that caregivers ask staff [employer] to lower front casters as well.  Main caregiver asking if PT is who gets the hospital bed for the patient (bed was ordered by physician); recommended that caregiver follow up with physician regarding order and to contact DME provider.  Remainder of session focused on use of coloring activity at counter top to attempt to motivate patient to stand and continue to stand.  Placed wheelchair in front of Steady in front of counter.  Pt was able to perform multiple reaches forwards for crayons, picture and other items used for motivation.  Pt also performed pulling on Steady bars lifting hips 2-3 inches off of wheelchair seat but never came to a full stand.  Utilized multiple techniques including caregiver assist, caregiver cues, having caregivers leave the gym to decrease distraction but pt would not stand fully upright in Steady.  Caregiver was encouraged though because last time pt did not even pull up as she did today multiple times.  PT Education - 07/19/18 1458    Education Details  taking order for RW to DME provider; having w/c provider lower front casters, contacting DME provider regarding hospital bed that was ordered    Person(s) Educated  Caregiver(s)    Methods  Explanation    Comprehension  Verbalized understanding       PT Short Term Goals - 06/30/18 1421      PT SHORT TERM GOAL #1   Title  Pt will participate in mat evaluation to assess transfers, sitting balance, LE ROM    Time  4    Period  Weeks    Status  New    Target Date  07/30/18      PT SHORT TERM GOAL #2   Title  Pt will initiate supine and  seated ROM/strengthening HEP with assistance of caregiver    Time  4    Period  Weeks    Status  New    Target Date  07/30/18      PT SHORT TERM GOAL #3   Title  Pt will perform full sit > stand from wheelchair and will transfer with RW to mat with mod A    Time  4    Period  Weeks    Status  New    Target Date  07/30/18      PT SHORT TERM GOAL #4   Title  Pt will maintain standing to participate in a leisure/recreational activity for 5-8 minutes with min A.    Time  4    Period  Weeks    Status  New    Target Date  07/30/18        PT Long Term Goals - 06/30/18 1427      PT LONG TERM GOAL #1   Title  Pt and caregiver will demonstrate independence with final HEP and recommendations for safe walking    Time  8    Period  Weeks    Status  New    Target Date  08/29/18      PT LONG TERM GOAL #2   Title  Pt will perform sit <> stand from wheelchair and stand pivot with RW with min A    Time  8    Period  Weeks    Status  New    Target Date  08/29/18      PT LONG TERM GOAL #3   Title  Pt will maintain standing at table/counter to participate in leisure/recreational activities for 10-12 minutes    Time  8    Period  Weeks    Status  New    Target Date  08/29/18      PT LONG TERM GOAL #4   Title  Pt will ambulate x 50' with RW and moderate assistance on indoor, level surfaces    Time  8    Period  Weeks    Status  New    Target Date  08/29/18            Plan - 07/19/18 1500    Clinical Impression Statement  Continued with use of Steady and counter top activities to attempt to motivate and encourage pt to stand more automatically.  Pt did reach forwards and pull upright slightly off of chair multiple times today but did not fully stand.  At times pt would become very anxious and verbalized, "I'm scared."  May need to continue to expose pt to Steady and same staff to decrease  anxiety around standing.  Will continue to attempt to utilize Steady next session.       Rehab Potential  Fair    Clinical Impairments Affecting Rehab Potential  hasn't ambulated in a year, possible syncope vs. seizure episodes, moderate intellectual disability    PT Frequency  2x / week    PT Duration  8 weeks    PT Treatment/Interventions  ADLs/Self Care Home Management;DME Instruction;Gait training;Stair training;Functional mobility training;Therapeutic activities;Therapeutic exercise;Balance training;Neuromuscular re-education;Patient/family education;Wheelchair mobility training;Passive range of motion    PT Next Visit Plan  Work on scooting transfers. If you can get her on the mat-asses LE ROM.  Initiate supine and sitting HEP.  Standing at sink or // bars. May try STEADY for sit > stand and postural control in upright sitting    PT Home Exercise Plan  LE ROM and strengthening, standing activities    Consulted and Agree with Plan of Care  Patient;Family member/caregiver    Family Member Consulted  Habilitation tech from day program       Patient will benefit from skilled therapeutic intervention in order to improve the following deficits and impairments:  Decreased activity tolerance, Decreased balance, Decreased endurance, Decreased mobility, Decreased range of motion, Decreased strength, Difficulty walking, Postural dysfunction  Visit Diagnosis: Repeated falls  Muscle weakness (generalized)  Unsteadiness on feet  Difficulty in walking, not elsewhere classified     Problem List Patient Active Problem List   Diagnosis Date Noted  . Osteopenia 06/23/2017  . Incontinence of urine in female 06/23/2017  . Vitamin D deficiency 03/19/2017  . At high risk for falls 03/19/2017  . Candidal intertrigo 08/28/2016  . Allergic rhinitis 02/28/2016  . Health care maintenance 07/27/2015  . Sinus bradycardia   . Dementia associated with other underlying disease with behavioral disturbance ([place]) 05/18/2015  . Down syndrome 03/24/2014  . Hyperlipidemia 03/24/2014  .  Hypothyroidism 03/24/2014  . Sleep apnea 03/24/2014   Rico Junker, PT, DPT 07/19/18    3:03 PM    [facility] [address] [place], [place], [address] Phone: [phone]   Fax:  [phone]  Name: Amanda Castillo MRN: [id number] Date of Birth: [birth year]

## 2018-07-21 ENCOUNTER — Encounter: Payer: Self-pay | Admitting: Rehabilitation

## 2018-07-21 ENCOUNTER — Ambulatory Visit: Payer: Medicare Other | Admitting: Rehabilitation

## 2018-07-21 DIAGNOSIS — R2681 Unsteadiness on feet: Secondary | ICD-10-CM

## 2018-07-21 DIAGNOSIS — R296 Repeated falls: Secondary | ICD-10-CM | POA: Diagnosis not present

## 2018-07-21 DIAGNOSIS — M6281 Muscle weakness (generalized): Secondary | ICD-10-CM

## 2018-07-21 DIAGNOSIS — R262 Difficulty in walking, not elsewhere classified: Secondary | ICD-10-CM

## 2018-07-21 NOTE — Therapy (Signed)
[facility] [address] [place], [place], [address] Phone: [phone]   Fax:  [phone]  Physical Therapy Treatment  Patient Details  Name: Amanda Castillo MRN: [id number] Date of Birth: 10-16-1955 Referring Provider (PT): Lorella Nimrod, MD; Axel Filler, MD   Encounter Date: 07/21/2018  PT End of Session - 07/21/18 1257    Visit Number  5    Number of Visits  17    Date for PT Re-Evaluation  08/29/18    Authorization Type  Medicare and Medicaid 10th visit PN    PT Start Time  1146    PT Stop Time  1225    PT Time Calculation (min)  39 min    Activity Tolerance  Patient tolerated treatment well    Behavior During Therapy  Anxious       Past Medical History:  Diagnosis Date  . Allergy   . Depression   . Down's syndrome   . Hallucinations   . Hyperlipidemia   . Hypothyroidism   . Moderate intellectual disability   . Orthostatic hypotension 06/06/2015  . PVD (peripheral vascular disease) ([place])   . Sleep apnea   . Stroke [facility])    in 2006    Past Surgical History:  Procedure Laterality Date  . COLONOSCOPY  06/12/2012  . orif lt foot    . TONSILLECTOMY      There were no vitals filed for this visit.  Subjective Assessment - 07/21/18 1254    Subjective  Caregiver reports no changes, has not stood again since from car.     Pertinent History  Down's Syndrome, CVA in 2006, sleep apnea, PVD, orthostatic hypotension, moderate intellectual disability, hypothyroidism, hyperlipidemia, hallucinations and depression    Limitations  Walking    Diagnostic tests  no acute findings    Patient Stated Goals  To begin walking again    Currently in Pain?  --   She reports pain in leg, but unable to state where          TA:  Initially had pt try to stand using stedy from seated position in w/c.  Utilized coloring books placed in front of her to provide encouragement for any initiation of movement.  This  did not help, therefore transitioned to sink in order to have her attempt more automatic task of "washing hands so we can have a treat."  PT demonstrated and then attempted to have pt engage in activity.  This also did not work, therefore transitioned to treatment room for less distraction and attempted reward system with assist from caregiver at this time.  Utilized music to attempt to motivate pt.  None of this was able to make pt initiate any movement during session.  PT did provide order for hospital bed to caregiver [facility].                     PT Education - 07/21/18 1255    Education Details  Provided order for hospital bed to caregiver, they report they are getting RW today.     Person(s) Educated  Patient    Methods  Explanation    Comprehension  Verbalized understanding       PT Short Term Goals - 06/30/18 1421      PT SHORT TERM GOAL #1   Title  Pt will participate in mat evaluation to assess transfers, sitting balance, LE ROM  Time  4    Period  Weeks    Status  New    Target Date  07/30/18      PT SHORT TERM GOAL #2   Title  Pt will initiate supine and seated ROM/strengthening HEP with assistance of caregiver    Time  4    Period  Weeks    Status  New    Target Date  07/30/18      PT SHORT TERM GOAL #3   Title  Pt will perform full sit > stand from wheelchair and will transfer with RW to mat with mod A    Time  4    Period  Weeks    Status  New    Target Date  07/30/18      PT SHORT TERM GOAL #4   Title  Pt will maintain standing to participate in a leisure/recreational activity for 5-8 minutes with min A.    Time  4    Period  Weeks    Status  New    Target Date  07/30/18        PT Long Term Goals - 06/30/18 1427      PT LONG TERM GOAL #1   Title  Pt and caregiver will demonstrate independence with final HEP and recommendations for safe walking    Time  8    Period  Weeks    Status  New     Target Date  08/29/18      PT LONG TERM GOAL #2   Title  Pt will perform sit <> stand from wheelchair and stand pivot with RW with min A    Time  8    Period  Weeks    Status  New    Target Date  08/29/18      PT LONG TERM GOAL #3   Title  Pt will maintain standing at table/counter to participate in leisure/recreational activities for 10-12 minutes    Time  8    Period  Weeks    Status  New    Target Date  08/29/18      PT LONG TERM GOAL #4   Title  Pt will ambulate x 50' with RW and moderate assistance on indoor, level surfaces    Time  8    Period  Weeks    Status  New    Target Date  08/29/18            Plan - 07/21/18 1258    Clinical Impression Statement  Continued to attempt to use stedy and countertop activities to motivate pt to stand or even shift weight forward to initiate task.  Despite multiple attempts with use of coloring books, automatic tasks (washing hands), and use of reward system to faciliate movement/standing, however she did not participate today.     Rehab Potential  Fair    Clinical Impairments Affecting Rehab Potential  hasn't ambulated in a year, possible syncope vs. seizure episodes, moderate intellectual disability    PT Frequency  2x / week    PT Duration  8 weeks    PT Treatment/Interventions  ADLs/Self Care Home Management;DME Instruction;Gait training;Stair training;Functional mobility training;Therapeutic activities;Therapeutic exercise;Balance training;Neuromuscular re-education;Patient/family education;Wheelchair mobility training;Passive range of motion    PT Next Visit Plan  Audra-I couldn't get her to do anything for my last session!! I'm not sure we can really continue with therapy at this point?? Work on scooting transfers. If you can get her on the  mat-asses LE ROM.  Initiate supine and sitting HEP.  Standing at sink or // bars. May try STEADY for sit > stand and postural control in upright sitting    PT Home Exercise Plan  LE ROM and  strengthening, standing activities    Consulted and Agree with Plan of Care  Patient;Family member/caregiver    Family Member Consulted  Habilitation tech from day program       Patient will benefit from skilled therapeutic intervention in order to improve the following deficits and impairments:  Decreased activity tolerance, Decreased balance, Decreased endurance, Decreased mobility, Decreased range of motion, Decreased strength, Difficulty walking, Postural dysfunction  Visit Diagnosis: Muscle weakness (generalized)  Unsteadiness on feet  Difficulty in walking, not elsewhere classified     Problem List Patient Active Problem List   Diagnosis Date Noted  . Osteopenia 06/23/2017  . Incontinence of urine in female 06/23/2017  . Vitamin D deficiency 03/19/2017  . At high risk for falls 03/19/2017  . Candidal intertrigo 08/28/2016  . Allergic rhinitis 02/28/2016  . Health care maintenance 07/27/2015  . Sinus bradycardia   . Dementia associated with other underlying disease with behavioral disturbance ([place]) 05/18/2015  . Down syndrome 03/24/2014  . Hyperlipidemia 03/24/2014  . Hypothyroidism 03/24/2014  . Sleep apnea 03/24/2014    Cameron Sprang, PT, MPT [facility] [address] [place], [place], [address] Phone: [phone]   Fax:  [phone] 07/21/18, 1:04 PM  Name: Amanda Castillo MRN: [id number] Date of Birth: [birth year]

## 2018-07-24 ENCOUNTER — Ambulatory Visit: Payer: Medicare Other | Admitting: Physical Therapy

## 2018-07-24 DIAGNOSIS — R262 Difficulty in walking, not elsewhere classified: Secondary | ICD-10-CM

## 2018-07-24 DIAGNOSIS — M6281 Muscle weakness (generalized): Secondary | ICD-10-CM

## 2018-07-24 DIAGNOSIS — R296 Repeated falls: Secondary | ICD-10-CM | POA: Diagnosis not present

## 2018-07-24 DIAGNOSIS — R2681 Unsteadiness on feet: Secondary | ICD-10-CM

## 2018-07-24 NOTE — Therapy (Signed)
[facility] [address] [place], [place], [address] Phone: [phone]   Fax:  [phone]  Physical Therapy Treatment  Patient Details  Name: Amanda Castillo MRN: [id number] Date of Birth: 09/10/1955 Referring Provider (PT): Lorella Nimrod, MD; Axel Filler, MD   Encounter Date: 07/24/2018  PT End of Session - 07/24/18 1210    Visit Number  6    Number of Visits  17    Date for PT Re-Evaluation  08/29/18    Authorization Type  Medicare and Medicaid 10th visit PN    PT Start Time  1017    PT Stop Time  1102    PT Time Calculation (min)  45 min    Activity Tolerance  Patient tolerated treatment well    Behavior During Therapy  Anxious       Past Medical History:  Diagnosis Date  . Allergy   . Depression   . Down's syndrome   . Hallucinations   . Hyperlipidemia   . Hypothyroidism   . Moderate intellectual disability   . Orthostatic hypotension 06/06/2015  . PVD (peripheral vascular disease) ([place])   . Sleep apnea   . Stroke [facility])    in 2006    Past Surgical History:  Procedure Laterality Date  . COLONOSCOPY  06/12/2012  . orif lt foot    . TONSILLECTOMY      There were no vitals filed for this visit.  Subjective Assessment - 07/24/18 1204    Subjective  Stood up at day program yesterday.      Pertinent History  Down's Syndrome, CVA in 2006, sleep apnea, PVD, orthostatic hypotension, moderate intellectual disability, hypothyroidism, hyperlipidemia, hallucinations and depression    Limitations  Walking    Diagnostic tests  no acute findings    Patient Stated Goals  To begin walking again    Currently in Pain?  No/denies                       [facility] Adult PT Treatment/Exercise - 07/24/18 1204      Transfers   Transfers  Sit to Stand;Stand to Sit    Sit to Stand  1: +2 Total assist    Stand to Sit  1: +2 Total assist    Transfer Cueing  Performed sit <> stand with total A of  standing frame      Therapeutic Activites    Therapeutic Activities  Other Therapeutic Activities    Other Therapeutic Activities  Pt given choice of two activities: pt chose coloring.  Placed w/c next to mat with coloring book and crayons on mat next to pt.  Attempted to utilize global commands and hand gestures to facilitate automatic movements/transitions.  Pt reported wanting to stand but when therapist offered hands pt would count and swing arms but would not initiate sit > stand.  Also attempted to have pt scoot from w/c > mat across board and then without board.  Pt would initiate leaning forwards but would not initiate scooting.  Day program aide present during second half and attempted to assist pt to standing but pt refused.  Therapist and aide set up patient in standing frame and brought pt to full stand with table in front.  Pt able to stand in frame x 8 minutes and perform coloring and then performing ball tosses to therapist.  Initially pt remained very flexed with weight fully supported on UE but after 2-3 ball tosses pt was  able to stand with a more upright trunk and lift UE off of table to throw the ball into "the goal" for makeshift basketball.  By end of session pt was more engaged.                 PT Short Term Goals - 06/30/18 1421      PT SHORT TERM GOAL #1   Title  Pt will participate in mat evaluation to assess transfers, sitting balance, LE ROM    Time  4    Period  Weeks    Status  New    Target Date  07/30/18      PT SHORT TERM GOAL #2   Title  Pt will initiate supine and seated ROM/strengthening HEP with assistance of caregiver    Time  4    Period  Weeks    Status  New    Target Date  07/30/18      PT SHORT TERM GOAL #3   Title  Pt will perform full sit > stand from wheelchair and will transfer with RW to mat with mod A    Time  4    Period  Weeks    Status  New    Target Date  07/30/18      PT SHORT TERM GOAL #4   Title  Pt will maintain standing  to participate in a leisure/recreational activity for 5-8 minutes with min A.    Time  4    Period  Weeks    Status  New    Target Date  07/30/18        PT Long Term Goals - 06/30/18 1427      PT LONG TERM GOAL #1   Title  Pt and caregiver will demonstrate independence with final HEP and recommendations for safe walking    Time  8    Period  Weeks    Status  New    Target Date  08/29/18      PT LONG TERM GOAL #2   Title  Pt will perform sit <> stand from wheelchair and stand pivot with RW with min A    Time  8    Period  Weeks    Status  New    Target Date  08/29/18      PT LONG TERM GOAL #3   Title  Pt will maintain standing at table/counter to participate in leisure/recreational activities for 10-12 minutes    Time  8    Period  Weeks    Status  New    Target Date  08/29/18      PT LONG TERM GOAL #4   Title  Pt will ambulate x 50' with RW and moderate assistance on indoor, level surfaces    Time  8    Period  Weeks    Status  New    Target Date  08/29/18            Plan - 07/24/18 1210    Clinical Impression Statement  Continued to utilize patient's interests/activities to facilitate and motivate automatic standing but pt continued to be fearful and resistant to stand or transfer to mat.  Pt was able to stand for prolonged period of time in standing frame but required increased time to fully engage in standing activities and to feel confident enough to lift UE from table.  Will continue to address in order to progress towards LTG.    Rehab Potential  Fair  Clinical Impairments Affecting Rehab Potential  hasn't ambulated in a year, possible syncope vs. seizure episodes, moderate intellectual disability    PT Frequency  2x / week    PT Duration  8 weeks    PT Treatment/Interventions  ADLs/Self Care Home Management;DME Instruction;Gait training;Stair training;Functional mobility training;Therapeutic activities;Therapeutic exercise;Balance training;Neuromuscular  re-education;Patient/family education;Wheelchair mobility training;Passive range of motion    PT Next Visit Plan  STG are due.   So far using standing frame has been the most successful for getting her in standing.  Keep using standing frame to perform ball tosses, coloring, and any other activities she would be interested in for prolonged standing.      PT Home Exercise Plan  LE ROM and strengthening, standing activities    Consulted and Agree with Plan of Care  Patient;Family member/caregiver    Family Member Consulted  Habilitation tech from day program       Patient will benefit from skilled therapeutic intervention in order to improve the following deficits and impairments:  Decreased activity tolerance, Decreased balance, Decreased endurance, Decreased mobility, Decreased range of motion, Decreased strength, Difficulty walking, Postural dysfunction  Visit Diagnosis: Muscle weakness (generalized)  Unsteadiness on feet  Difficulty in walking, not elsewhere classified  Repeated falls     Problem List Patient Active Problem List   Diagnosis Date Noted  . Osteopenia 06/23/2017  . Incontinence of urine in female 06/23/2017  . Vitamin D deficiency 03/19/2017  . At high risk for falls 03/19/2017  . Candidal intertrigo 08/28/2016  . Allergic rhinitis 02/28/2016  . Health care maintenance 07/27/2015  . Sinus bradycardia   . Dementia associated with other underlying disease with behavioral disturbance ([place]) 05/18/2015  . Down syndrome 03/24/2014  . Hyperlipidemia 03/24/2014  . Hypothyroidism 03/24/2014  . Sleep apnea 03/24/2014   Rico Junker, PT, DPT 07/24/18    12:15 PM    [facility] [address] [place], [place], [address] Phone: [phone]   Fax:  [phone]  Name: Amanda Castillo MRN: [id number] Date of Birth: 06-04-1956

## 2018-07-27 ENCOUNTER — Ambulatory Visit: Payer: Medicare Other | Admitting: Physical Therapy

## 2018-07-27 ENCOUNTER — Encounter: Payer: Self-pay | Admitting: Physical Therapy

## 2018-07-27 VITALS — BP 109/64 | HR 94

## 2018-07-27 DIAGNOSIS — R296 Repeated falls: Secondary | ICD-10-CM | POA: Diagnosis not present

## 2018-07-27 DIAGNOSIS — M6281 Muscle weakness (generalized): Secondary | ICD-10-CM

## 2018-07-27 DIAGNOSIS — R2681 Unsteadiness on feet: Secondary | ICD-10-CM

## 2018-07-27 DIAGNOSIS — R262 Difficulty in walking, not elsewhere classified: Secondary | ICD-10-CM

## 2018-07-27 NOTE — Therapy (Signed)
[facility] [address] [place], [place], [address] Phone: [phone]   Fax:  [phone]  Physical Therapy Treatment  Patient Details  Name: Amanda Castillo MRN: [id number] Date of Birth: 1955-08-08 Referring Provider (PT): Lorella Nimrod, MD; Axel Filler, MD   Encounter Date: 07/27/2018  PT End of Session - 07/27/18 2040    Visit Number  7    Number of Visits  17    Date for PT Re-Evaluation  08/29/18    Authorization Type  Medicare and Medicaid 10th visit PN    PT Start Time  1021    PT Stop Time  1110    PT Time Calculation (min)  49 min    Equipment Utilized During Treatment  Other (comment)   standing frame   Activity Tolerance  Patient limited by lethargy    Behavior During Therapy  Anxious       Past Medical History:  Diagnosis Date  . Allergy   . Depression   . Down's syndrome   . Hallucinations   . Hyperlipidemia   . Hypothyroidism   . Moderate intellectual disability   . Orthostatic hypotension 06/06/2015  . PVD (peripheral vascular disease) ([place])   . Sleep apnea   . Stroke [facility])    in 2006    Past Surgical History:  Procedure Laterality Date  . COLONOSCOPY  06/12/2012  . orif lt foot    . TONSILLECTOMY      Vitals:   07/27/18 1030 07/27/18 1111  BP: 95/60 109/64  Pulse: 80 94  SpO2:  98%    Subjective Assessment - 07/27/18 1242    Subjective  Pt more sleepy today, leaning heavily to L but pt responsive    Pertinent History  Down's Syndrome, CVA in 2006, sleep apnea, PVD, orthostatic hypotension, moderate intellectual disability, hypothyroidism, hyperlipidemia, hallucinations and depression    Limitations  Walking    Diagnostic tests  no acute findings    Patient Stated Goals  To begin walking again    Currently in Pain?  No/denies                       [facility] Adult PT Treatment/Exercise - 07/27/18 1242      Transfers   Transfers  Sit to Stand;Stand  to Sit;Squat Pivot Transfers    Sit to Stand  1: +2 Total assist    Sit to Stand Details (indicate cue type and reason)  with use of standing frame from mat    Stand to Sit  1: +2 Total assist    Stand to Sit Details  standing frame back down to mat    Squat Pivot Transfers  1: +2 Total assist    Squat Pivot Transfer Details (indicate cue type and reason)  total A squat pivot w/c <> mat after multiple attempts to have pt stand pivot with aide      Therapeutic Activites    Therapeutic Activities  Other Therapeutic Activities    Other Therapeutic Activities  Due to patient's decreased arousal and changes in postural control performed assessment of vitals in sitting and standing.   Pt did experience a slight drop in BP in standing but did not appear to be symptomatic for syncope.  Discussed with habilitation aide patient's diagnosis of dementia and how her alertness and arousal may vary day to day and can be affected by sleep/wake cycle and medications.  Will need to continue to educate patient's caregivers  about variability in physical abilities and alertness/arousal as dementia progresses.   In standing frame attempted to engage pt in reaching for cones, cone stacking, ball pass and making music with [employer] while singing "You are my Sunshine".  Pt would intermittently initiate a movement to throw the ball or move her [employer] but was unable to attend to activity or perform full movement.  Was able to sing entire song and hold attention to song with therapist 1 out of 4 repetitions.             PT Education - 07/27/18 2032    Education Details  education on dementia and changes with progression of dementia    Person(s) Educated  Patient;Caregiver(s)    Methods  Explanation    Comprehension  Verbalized understanding       PT Short Term Goals - 06/30/18 1421      PT SHORT TERM GOAL #1   Title  Pt will participate in mat evaluation to assess transfers, sitting balance, LE ROM     Time  4    Period  Weeks    Status  New    Target Date  07/30/18      PT SHORT TERM GOAL #2   Title  Pt will initiate supine and seated ROM/strengthening HEP with assistance of caregiver    Time  4    Period  Weeks    Status  New    Target Date  07/30/18      PT SHORT TERM GOAL #3   Title  Pt will perform full sit > stand from wheelchair and will transfer with RW to mat with mod A    Time  4    Period  Weeks    Status  New    Target Date  07/30/18      PT SHORT TERM GOAL #4   Title  Pt will maintain standing to participate in a leisure/recreational activity for 5-8 minutes with min A.    Time  4    Period  Weeks    Status  New    Target Date  07/30/18        PT Long Term Goals - 06/30/18 1427      PT LONG TERM GOAL #1   Title  Pt and caregiver will demonstrate independence with final HEP and recommendations for safe walking    Time  8    Period  Weeks    Status  New    Target Date  08/29/18      PT LONG TERM GOAL #2   Title  Pt will perform sit <> stand from wheelchair and stand pivot with RW with min A    Time  8    Period  Weeks    Status  New    Target Date  08/29/18      PT LONG TERM GOAL #3   Title  Pt will maintain standing at table/counter to participate in leisure/recreational activities for 10-12 minutes    Time  8    Period  Weeks    Status  New    Target Date  08/29/18      PT LONG TERM GOAL #4   Title  Pt will ambulate x 50' with RW and moderate assistance on indoor, level surfaces    Time  8    Period  Weeks    Status  New    Target Date  08/29/18  Plan - 07/27/18 2042    Clinical Impression Statement  Continued to perform prolonged standing in standing frame while performing various activities to attempt to engage pt (music, boom sticks, cones, ball toss).  Pt demonstrated decreased tolerance to activity today due to lethargy.  Pt demonstrated decreased overall alertness and arousal level (but was responsive but delayed), more  anxious today and demonstrated significant lateral lean to the L which improved after performing standing frame.  Pt's vitals were stable but will continue to monitor and if pt mental status and posture continue to decline will place PT on hold until pt is examined by a physician.    Rehab Potential  Fair    Clinical Impairments Affecting Rehab Potential  hasn't ambulated in a year, possible syncope vs. seizure episodes, moderate intellectual disability    PT Frequency  2x / week    PT Duration  8 weeks    PT Treatment/Interventions  ADLs/Self Care Home Management;DME Instruction;Gait training;Stair training;Functional mobility training;Therapeutic activities;Therapeutic exercise;Balance training;Neuromuscular re-education;Patient/family education;Wheelchair mobility training;Passive range of motion    PT Next Visit Plan  Is she still demonstrating L lateral lean/increased lethargy/decreased arousal??  If so, put PT on hold and have pt examined by physician (medication, CVA, seizure, infection?)   STG due!  Transfer to [employer] for exercise?  Transfer to mat for standing frame.  So far using standing frame has been the most successful for getting her in standing.  Keep using standing frame to perform ball tosses, coloring, and any other activities she would be interested in for prolonged standing.      PT Home Exercise Plan  LE ROM and strengthening, standing activities    Consulted and Agree with Plan of Care  Patient;Family member/caregiver    Family Member Consulted  Habilitation tech from day program       Patient will benefit from skilled therapeutic intervention in order to improve the following deficits and impairments:  Decreased activity tolerance, Decreased balance, Decreased endurance, Decreased mobility, Decreased range of motion, Decreased strength, Difficulty walking, Postural dysfunction  Visit Diagnosis: Muscle weakness (generalized)  Unsteadiness on feet  Difficulty in walking, not  elsewhere classified  Repeated falls     Problem List Patient Active Problem List   Diagnosis Date Noted  . Osteopenia 06/23/2017  . Incontinence of urine in female 06/23/2017  . Vitamin D deficiency 03/19/2017  . At high risk for falls 03/19/2017  . Candidal intertrigo 08/28/2016  . Allergic rhinitis 02/28/2016  . Health care maintenance 07/27/2015  . Sinus bradycardia   . Dementia associated with other underlying disease with behavioral disturbance ([place]) 05/18/2015  . Down syndrome 03/24/2014  . Hyperlipidemia 03/24/2014  . Hypothyroidism 03/24/2014  . Sleep apnea 03/24/2014    Rico Junker, PT, DPT 07/27/18    8:53 PM    [facility] [address] [place], [place], [address] Phone: [phone]   Fax:  [phone]  Name: Amanda Castillo MRN: [id number] Date of Birth: 1955-09-19

## 2018-07-30 ENCOUNTER — Encounter: Payer: Self-pay | Admitting: Physical Therapy

## 2018-07-30 ENCOUNTER — Ambulatory Visit: Payer: Medicare Other | Admitting: Physical Therapy

## 2018-07-30 DIAGNOSIS — M6281 Muscle weakness (generalized): Secondary | ICD-10-CM

## 2018-07-30 DIAGNOSIS — R2681 Unsteadiness on feet: Secondary | ICD-10-CM

## 2018-07-30 DIAGNOSIS — R296 Repeated falls: Secondary | ICD-10-CM | POA: Diagnosis not present

## 2018-07-30 DIAGNOSIS — R262 Difficulty in walking, not elsewhere classified: Secondary | ICD-10-CM

## 2018-07-30 NOTE — Therapy (Signed)
[facility] [address] [place], [place], [address] Phone: [phone]   Fax:  [phone]  Physical Therapy Treatment  Patient Details  Name: Amanda Castillo MRN: [id number] Date of Birth: 1955/07/11 Referring Provider (PT): Lorella Nimrod, MD; Axel Filler, MD   Encounter Date: 07/30/2018  PT End of Session - 07/30/18 1202    Visit Number  8    Number of Visits  17    Date for PT Re-Evaluation  08/29/18    Authorization Type  Medicare and Medicaid 10th visit PN    PT Start Time  1105    PT Stop Time  1148    PT Time Calculation (min)  43 min    Equipment Utilized During Treatment  Other (comment)   standing frame   Activity Tolerance  Patient tolerated treatment well    Behavior During Therapy  [facility] for tasks assessed/performed       Past Medical History:  Diagnosis Date  . Allergy   . Depression   . Down's syndrome   . Hallucinations   . Hyperlipidemia   . Hypothyroidism   . Moderate intellectual disability   . Orthostatic hypotension 06/06/2015  . PVD (peripheral vascular disease) ([facility])   . Sleep apnea   . Stroke [facility])    in 2006    Past Surgical History:  Procedure Laterality Date  . COLONOSCOPY  06/12/2012  . orif lt foot    . TONSILLECTOMY      There were no vitals filed for this visit.  Subjective Assessment - 07/30/18 1110    Subjective  Less sleepy/lethargic today.  Still leaning to L.  Just came from day program.    Pertinent History  Down's Syndrome, CVA in 2006, sleep apnea, PVD, orthostatic hypotension, moderate intellectual disability, hypothyroidism, hyperlipidemia, hallucinations and depression    Limitations  Walking    Diagnostic tests  no acute findings    Patient Stated Goals  To begin walking again    Currently in Pain?  No/denies                       [facility] Adult PT Treatment/Exercise - 07/30/18 1156      Therapeutic Activites    Therapeutic  Activities  Other Therapeutic Activities    Other Therapeutic Activities  Pt assisted into supported standing in standing frame for various activities to increase arousal, upright posture and WB through LE.  Pt performed standing >15 minutes while performing ball raises, ball tosses, and two sets of shoulder strengthening with 1lb.   Pt did well maintaining attention to task when therapist performed counting out loud with her.      Exercises   Exercises  Shoulder;Knee/Hip      Knee/Hip Exercises: Seated   Long Arc Quad  AROM;Right;Left;1 set;10 reps    Long Arc Quad Limitations  while kicking ball to [employer] fund[employer] with [employer]  Red    Hamstring Curl  Strengthening;Right;Left;1 set;10 reps    Hamstring Limitations  with red theraband around legs      Shoulder Exercises: Standing   Protraction  Strengthening;Right;Left;10 reps;Weights    Protraction Weight (lbs)  1    Protraction Limitations  single arm punches while in supported standing in standing frame    Flexion  AAROM;Strengthening;Both;10 reps;Right;Left;Weights;Other (comment)    Shoulder Flexion Weight (lbs)  1    Flexion Limitations  First performed bilat UE flexion holding basketball in both hands with  therapist providing hand over hand assistance and guidance to raise ball over head, 3 sets x 10 reps to prepare for ball tosses.  Pt performed ball tosses 3 sets x 10 tosses lifting UE fully off frame tray.  Transitioned to holding weight in one UE and performing overhead press, 2 sets x 10 reps each side               PT Short Term Goals - 06/30/18 1421      PT SHORT TERM GOAL #1   Title  Pt will participate in mat evaluation to assess transfers, sitting balance, LE ROM    Time  4    Period  Weeks    Status  New    Target Date  07/30/18      PT SHORT TERM GOAL #2   Title  Pt will initiate supine and seated ROM/strengthening HEP with assistance of caregiver    Time  4    Period  Weeks    Status  New    Target  Date  07/30/18      PT SHORT TERM GOAL #3   Title  Pt will perform full sit > stand from wheelchair and will transfer with RW to mat with mod A    Time  4    Period  Weeks    Status  New    Target Date  07/30/18      PT SHORT TERM GOAL #4   Title  Pt will maintain standing to participate in a leisure/recreational activity for 5-8 minutes with min A.    Time  4    Period  Weeks    Status  New    Target Date  07/30/18        PT Long Term Goals - 06/30/18 1427      PT LONG TERM GOAL #1   Title  Pt and caregiver will demonstrate independence with final HEP and recommendations for safe walking    Time  8    Period  Weeks    Status  New    Target Date  08/29/18      PT LONG TERM GOAL #2   Title  Pt will perform sit <> stand from wheelchair and stand pivot with RW with min A    Time  8    Period  Weeks    Status  New    Target Date  08/29/18      PT LONG TERM GOAL #3   Title  Pt will maintain standing at table/counter to participate in leisure/recreational activities for 10-12 minutes    Time  8    Period  Weeks    Status  New    Target Date  08/29/18      PT LONG TERM GOAL #4   Title  Pt will ambulate x 50' with RW and moderate assistance on indoor, level surfaces    Time  8    Period  Weeks    Status  New    Target Date  08/29/18            Plan - 07/30/18 1202    Clinical Impression Statement  Wheelchair front casters have been moved to middle setting making chair more level; pt still has feet unsupported and dragging  - PT continues to recommend use of leg rests for foot support and to give pt increased tactile input for security.  Pt more alert and engaged in activities today.  Pt able  to maintain attention to ball tosses, UE strengthening in standing and LE strengthening in sitting with counting outloud.  Following standing pt demonstrated more active and upright posture in chair.  Will continue to find exercises for pt to perform at home with UE and LE.     Rehab Potential  Fair    Clinical Impairments Affecting Rehab Potential  hasn't ambulated in a year, possible syncope vs. seizure episodes, moderate intellectual disability    PT Frequency  2x / week    PT Duration  8 weeks    PT Treatment/Interventions  ADLs/Self Care Home Management;DME Instruction;Gait training;Stair training;Functional mobility training;Therapeutic activities;Therapeutic exercise;Balance training;Neuromuscular re-education;Patient/family education;Wheelchair mobility training;Passive range of motion    PT Next Visit Plan  address STG on visit 10.  Transfer to Nu[employer] for exercise.  Perform seated UE and LE exercises with red theraband and give exercises for pt to do at home.  Transfer to mat for standing frame.  So far using standing frame has been the most successful for getting her in standing.  Keep using standing frame to perform ball tosses, coloring, and any other activities she would be interested in for prolonged standing.      PT Home Exercise Plan  LE ROM and strengthening, standing activities    Consulted and Agree with Plan of Care  Patient;Family member/caregiver    Family Member Consulted  Habilitation tech from day program       Patient will benefit from skilled therapeutic intervention in order to improve the following deficits and impairments:  Decreased activity tolerance, Decreased balance, Decreased endurance, Decreased mobility, Decreased range of motion, Decreased strength, Difficulty walking, Postural dysfunction  Visit Diagnosis: Muscle weakness (generalized)  Unsteadiness on feet  Difficulty in walking, not elsewhere classified  Repeated falls     Problem List Patient Active Problem List   Diagnosis Date Noted  . Osteopenia 06/23/2017  . Incontinence of urine in female 06/23/2017  . Vitamin D deficiency 03/19/2017  . At high risk for falls 03/19/2017  . Candidal intertrigo 08/28/2016  . Allergic rhinitis 02/28/2016  . Health care  maintenance 07/27/2015  . Sinus bradycardia   . Dementia associated with other underlying disease with behavioral disturbance ([place]) 05/18/2015  . Down syndrome 03/24/2014  . Hyperlipidemia 03/24/2014  . Hypothyroidism 03/24/2014  . Sleep apnea 03/24/2014    Rico Junker, PT, DPT 07/30/18    12:06 PM    [facility] [address] [place], [place], [address] Phone: [phone]   Fax:  [phone]  Name: Amanda Castillo MRN: [id number] Date of Birth: 04-21-1956

## 2018-08-03 ENCOUNTER — Ambulatory Visit: Payer: Medicare Other | Admitting: Physical Therapy

## 2018-08-03 DIAGNOSIS — R296 Repeated falls: Secondary | ICD-10-CM

## 2018-08-03 DIAGNOSIS — M6281 Muscle weakness (generalized): Secondary | ICD-10-CM

## 2018-08-03 DIAGNOSIS — R2681 Unsteadiness on feet: Secondary | ICD-10-CM

## 2018-08-03 DIAGNOSIS — R262 Difficulty in walking, not elsewhere classified: Secondary | ICD-10-CM

## 2018-08-03 NOTE — Therapy (Signed)
[facility] [address] [place], [place], [address] Phone: [phone]   Fax:  [phone]  Physical Therapy Treatment  Patient Details  Name: Amanda Castillo MRN: [id number] Date of Birth: 1956/03/08 Referring Provider (PT): Lorella Nimrod, MD; Axel Filler, MD   Encounter Date: 08/03/2018  PT End of Session - 08/03/18 1628    Visit Number  9    Number of Visits  17    Date for PT Re-Evaluation  08/29/18    Authorization Type  Medicare and Medicaid 10th visit PN    PT Start Time  1018    PT Stop Time  1105    PT Time Calculation (min)  47 min    Activity Tolerance  Patient tolerated treatment well    Behavior During Therapy  [facility] for tasks assessed/performed       Past Medical History:  Diagnosis Date  . Allergy   . Depression   . Down's syndrome   . Hallucinations   . Hyperlipidemia   . Hypothyroidism   . Moderate intellectual disability   . Orthostatic hypotension 06/06/2015  . PVD (peripheral vascular disease) ([place])   . Sleep apnea   . Stroke [facility])    in 2006    Past Surgical History:  Procedure Laterality Date  . COLONOSCOPY  06/12/2012  . orif lt foot    . TONSILLECTOMY      There were no vitals filed for this visit.  Subjective Assessment - 08/03/18 1608    Subjective  No issues over the weekend; has elevating leg rests for the wheelchair    Pertinent History  Down's Syndrome, CVA in 2006, sleep apnea, PVD, orthostatic hypotension, moderate intellectual disability, hypothyroidism, hyperlipidemia, hallucinations and depression    Limitations  Walking    Diagnostic tests  no acute findings    Patient Stated Goals  To begin walking again    Currently in Pain?  No/denies                       [facility] Adult PT Treatment/Exercise - 08/03/18 1610      Transfers   Transfers  Sit to Stand    Sit to Stand  From elevated surface;With upper extremity assist    Sit to Stand  Details (indicate cue type and reason)  Following use of Nustep and forward leaning for bowling re-attempted to stand from Nustep with therapist's HHA.  Attempted x 4 reps with use of counting to initiate transition.  Pt demonstrated improved initiation of anterior lean to start sit > stand and pt able to elevate hips half way up but did not come to a full stand even with therapist's assistance.     Squat Pivot Transfers  1: +2 Total assist    Squat Pivot Transfer Details (indicate cue type and reason)  therapist and aide w/c <> Nustep      Balance   Balance Assessed  Yes      Dynamic Sitting Balance   Dynamic Sitting - Balance Support  Feet supported    Dynamic Sitting - Level of Assistance  5: Stand by assistance    Dynamic Sitting - Balance Activities  Forward lean/weight shifting;Reaching for objects    Sitting balance - Comments  Performed repeated reaching down to floor to roll ball, reaching forwards for ball and reaching up and forwards for ball while playing bowling in seated position with feet supported but back and UE not supported.  Pt  did begin to c/o R shoulder pain and pt noted to be hiking R shoulder so placed ball below shoulder level but continued to have pt lean and reach forwards out of BOS.        Therapeutic Activites    Therapeutic Activities  Other Therapeutic Activities    Other Therapeutic Activities  Demonstrated to aide how to attach and remove ELR to wheelchair, how to lock into place and unlock ELR to swing away for transfers.  Also adjusted length of ELR for more optimal foot support and to place knees and hips in 90 deg of flexion.  Reviewed with aide how to elevate and lower ELR.  Aide return demonstrated each task.      Knee/Hip Exercises: Aerobic   Nustep  L2 x 6 minutes beginning with bilat UE and LE and then transitioning to LE only to focus on activation of LE extension.  Pt demonstrated increased initiation and activation with RUE and RLE and required  increased tactile and verbal cues to initiate with LLE and LUE.  Also required cues for reciprocal movement and to maintain attention to activity.               PT Education - 08/03/18 1627    Education Details  see TA    Person(s) Educated  Patient;Caregiver(s)    Methods  Explanation;Demonstration    Comprehension  Verbalized understanding;Returned demonstration       PT Short Term Goals - 06/30/18 1421      PT SHORT TERM GOAL #1   Title  Pt will participate in mat evaluation to assess transfers, sitting balance, LE ROM    Time  4    Period  Weeks    Status  New    Target Date  07/30/18      PT SHORT TERM GOAL #2   Title  Pt will initiate supine and seated ROM/strengthening HEP with assistance of caregiver    Time  4    Period  Weeks    Status  New    Target Date  07/30/18      PT SHORT TERM GOAL #3   Title  Pt will perform full sit > stand from wheelchair and will transfer with RW to mat with mod A    Time  4    Period  Weeks    Status  New    Target Date  07/30/18      PT SHORT TERM GOAL #4   Title  Pt will maintain standing to participate in a leisure/recreational activity for 5-8 minutes with min A.    Time  4    Period  Weeks    Status  New    Target Date  07/30/18        PT Long Term Goals - 06/30/18 1427      PT LONG TERM GOAL #1   Title  Pt and caregiver will demonstrate independence with final HEP and recommendations for safe walking    Time  8    Period  Weeks    Status  New    Target Date  08/29/18      PT LONG TERM GOAL #2   Title  Pt will perform sit <> stand from wheelchair and stand pivot with RW with min A    Time  8    Period  Weeks    Status  New    Target Date  08/29/18      PT LONG TERM  GOAL #3   Title  Pt will maintain standing at table/counter to participate in leisure/recreational activities for 10-12 minutes    Time  8    Period  Weeks    Status  New    Target Date  08/29/18      PT LONG TERM GOAL #4   Title  Pt will  ambulate x 50' with RW and moderate assistance on indoor, level surfaces    Time  8    Period  Weeks    Status  New    Target Date  08/29/18            Plan - 08/03/18 1643    Clinical Impression Statement  Set up w/c ELR to correct length and demonstrated to aide how to correctly attach for foot support and remove for transfers.  Attempted to perform seated endurance training, reciprocal UE and LE movement training and endurance training on Nustep but pt had increased difficulty coordinating and attending to activity.  Pt was able to engage in seated bowling improving anterior lean and weight shifting allowing pt to almost come to stand with therapist.  Will continue to address and attempt to progress towards LTG.    Rehab Potential  Fair    Clinical Impairments Affecting Rehab Potential  hasn't ambulated in a year, possible syncope vs. seizure episodes, moderate intellectual disability    PT Frequency  2x / week    PT Duration  8 weeks    PT Treatment/Interventions  ADLs/Self Care Home Management;DME Instruction;Gait training;Stair training;Functional mobility training;Therapeutic activities;Therapeutic exercise;Balance training;Neuromuscular re-education;Patient/family education;Wheelchair mobility training;Passive range of motion    PT Next Visit Plan  address STG on visit 10.  Transfer to Nu[employer] for exercise.  Perform seated UE and LE exercises with red theraband and give exercises for pt to do at home.  Seated bowling for anterior leaning.  Transfer to mat for standing frame.  So far using standing frame has been the most successful for getting her in standing.  Keep using standing frame to perform ball tosses, coloring, and any other activities she would be interested in for prolonged standing.      PT Home Exercise Plan  LE ROM and strengthening, standing activities    Consulted and Agree with Plan of Care  Patient;Family member/caregiver    Family Member Consulted  Habilitation tech  from day program       Patient will benefit from skilled therapeutic intervention in order to improve the following deficits and impairments:  Decreased activity tolerance, Decreased balance, Decreased endurance, Decreased mobility, Decreased range of motion, Decreased strength, Difficulty walking, Postural dysfunction  Visit Diagnosis: Muscle weakness (generalized)  Unsteadiness on feet  Difficulty in walking, not elsewhere classified  Repeated falls     Problem List Patient Active Problem List   Diagnosis Date Noted  . Osteopenia 06/23/2017  . Incontinence of urine in female 06/23/2017  . Vitamin D deficiency 03/19/2017  . At high risk for falls 03/19/2017  . Candidal intertrigo 08/28/2016  . Allergic rhinitis 02/28/2016  . Health care maintenance 07/27/2015  . Sinus bradycardia   . Dementia associated with other underlying disease with behavioral disturbance ([place]) 05/18/2015  . Down syndrome 03/24/2014  . Hyperlipidemia 03/24/2014  . Hypothyroidism 03/24/2014  . Sleep apnea 03/24/2014    Rico Junker, PT, DPT 08/03/18    4:46 PM    [facility] [address] [place], [place], [address] Phone: [phone]   Fax:  [facility]  Name: Amanda Castillo MRN: [id number] Date of Birth: 03-27-1956

## 2018-08-06 ENCOUNTER — Encounter: Payer: Self-pay | Admitting: Rehabilitation

## 2018-08-06 ENCOUNTER — Ambulatory Visit: Payer: Medicare Other | Admitting: Rehabilitation

## 2018-08-06 DIAGNOSIS — M6281 Muscle weakness (generalized): Secondary | ICD-10-CM

## 2018-08-06 DIAGNOSIS — R296 Repeated falls: Secondary | ICD-10-CM | POA: Diagnosis not present

## 2018-08-06 DIAGNOSIS — R2681 Unsteadiness on feet: Secondary | ICD-10-CM

## 2018-08-06 DIAGNOSIS — R262 Difficulty in walking, not elsewhere classified: Secondary | ICD-10-CM

## 2018-08-06 NOTE — Therapy (Signed)
[facility] [address] [place], [place], [address] Phone: [phone]   Fax:  [phone]  Physical Therapy Treatment and Progress note  Patient Details  Name: Amanda Castillo MRN: [id number] Date of Birth: April 04, 1956 Referring Provider (PT): Lorella Nimrod, MD; Axel Filler, MD   Encounter Date: 08/06/2018  PT End of Session - 08/06/18 1151    Visit Number  10    Number of Visits  17    Date for PT Re-Evaluation  08/29/18    Authorization Type  Medicare and Medicaid 10th visit PN    PT Start Time  1102    PT Stop Time  1145    PT Time Calculation (min)  43 min    Activity Tolerance  Patient tolerated treatment well    Behavior During Therapy  [facility] for tasks assessed/performed       Past Medical History:  Diagnosis Date  . Allergy   . Depression   . Down's syndrome   . Hallucinations   . Hyperlipidemia   . Hypothyroidism   . Moderate intellectual disability   . Orthostatic hypotension 06/06/2015  . PVD (peripheral vascular disease) ([place])   . Sleep apnea   . Stroke [facility])    in 2006    Past Surgical History:  Procedure Laterality Date  . COLONOSCOPY  06/12/2012  . orif lt foot    . TONSILLECTOMY      There were no vitals filed for this visit.  Subjective Assessment - 08/06/18 1204    Subjective  Pts caregiver reports she noticed that Amanda Castillo's feet were looking painful.  PT did assess today and note that toe nails are very thick, she has bunions on both feet, but is worse on the R foot with toes crossing over.  Feel that this is a potential for skin breakdown and toe nails look to have fungus on them.  Would recommend she see podiatrist.      Pertinent History  Down's Syndrome, CVA in 2006, sleep apnea, PVD, orthostatic hypotension, moderate intellectual disability, hypothyroidism, hyperlipidemia, hallucinations and depression    Limitations  Walking    Diagnostic tests  no acute findings    Patient Stated Goals  To begin walking again    Currently in Pain?  No/denies                       [facility] Adult PT Treatment/Exercise - 08/06/18 1105      Bed Mobility   Bed Mobility  Supine to Sit;Sit to Supine    Supine to Sit  Moderate Assistance - Patient 50-74%    Sit to Supine  Total Assistance - Patient < 25%      Transfers   Transfers  Sit to Stand;Stand to Sit;Stand Pivot Transfers    Sit to Stand  1: +1 Total assist    Sit to Stand Details  Tactile cues for initiation;Manual facilitation for weight shifting;Manual facilitation for placement;Manual facilitation for weight bearing    Sit to Stand Details (indicate cue type and reason)  Pt would finally initiate forward trunk movement with mod A however once PT changed to hand position to hips, pt would no longer maintain forward trunk movement and needed total A for mini stand.     Stand to Sit  1: +1 Total assist    Stand to Sit Details (indicate cue type and reason)  Tactile cues for initiation;Verbal cues for sequencing;Verbal cues for technique;Manual facilitation for placement;Manual  facilitation for weight shifting;Manual facilitation for weight bearing    Stand Pivot Transfers  1: +1 Total assist;1: +2 Total assist    Stand Pivot Transfer Details (indicate cue type and reason)  Pt did not initiate any motion in standing or during transfer.     Comments  At end of session, PT offered RW for pt to stand.  Pt reports she wants RW.  She did initiate hands on RW and forward trunk lean, however would not lean forward enough to take weight through LEs.  Once back in w/c, she did use UEs to push and elevate buttocks from w/c.  Pt would do this several times, but would not come to full stand.        Balance   Balance Assessed  Yes      Dynamic Sitting Balance   Dynamic Sitting - Balance Support  Feet supported    Dynamic Sitting - Level of Assistance  5: Stand by assistance;2: Max assist    Dynamic Sitting - Balance  Activities  Forward lean/weight shifting               PT Short Term Goals - 08/06/18 1109      PT SHORT TERM GOAL #1   Title  Pt will participate in mat evaluation to assess transfers, sitting balance, LE ROM    Baseline  Pt able to sit at [facility] with feet supported intermittently at S-at times max A, Pt with tightness noted at most hip motions in supine today 08/07/18    Time  4    Period  Weeks    Status  Achieved    Target Date  07/30/18      PT SHORT TERM GOAL #2   Title  Pt will initiate supine and seated ROM/strengthening HEP with assistance of caregiver    Baseline  Initiated in previous sessions     Time  4    Period  Weeks    Status  Achieved    Target Date  07/30/18      PT SHORT TERM GOAL #3   Title  Pt will perform full sit > stand from wheelchair and will transfer with RW to mat with mod A    Baseline  +2 total A    Time  4    Period  Weeks    Status  Not Met    Target Date  07/30/18      PT SHORT TERM GOAL #4   Title  Pt will maintain standing to participate in a leisure/recreational activity for 5-8 minutes with min A.    Baseline  met with standing frame     Time  4    Period  Weeks    Status  Achieved    Target Date  07/30/18        PT Long Term Goals - 06/30/18 1427      PT LONG TERM GOAL #1   Title  Pt and caregiver will demonstrate independence with final HEP and recommendations for safe walking    Time  8    Period  Weeks    Status  New    Target Date  08/29/18      PT LONG TERM GOAL #2   Title  Pt will perform sit <> stand from wheelchair and stand pivot with RW with min A    Time  8    Period  Weeks    Status  New    Target Date  08/29/18      PT LONG TERM GOAL #3   Title  Pt will maintain standing at table/counter to participate in leisure/recreational activities for 10-12 minutes    Time  8    Period  Weeks    Status  New    Target Date  08/29/18      PT LONG TERM GOAL #4   Title  Pt will ambulate x 50' with RW and  moderate assistance on indoor, level surfaces    Time  8    Period  Weeks    Status  New    Target Date  08/29/18         Progress Note Reporting Period 06/29/18 to 08/06/18  See note below for Objective Data and Assessment of Progress/Goals.         Plan - 08/06/18 1150    Clinical Impression Statement  Pt has met 3/4 STGS, however first goal was to assess mobility at mat level.  Pt requires +2 total A to transfer to mat and mod A to total A for bed mobility. Once on mat, note tightness in B hips, however pt does not initiate any movement on mat.  Pt is initiating standing during sessions, however is unable to take weight through legs without total A at this time.     Rehab Potential  Fair    Clinical Impairments Affecting Rehab Potential  hasn't ambulated in a year, possible syncope vs. seizure episodes, moderate intellectual disability    PT Frequency  2x / week    PT Duration  8 weeks    PT Treatment/Interventions  ADLs/Self Care Home Management;DME Instruction;Gait training;Stair training;Functional mobility training;Therapeutic activities;Therapeutic exercise;Balance training;Neuromuscular re-education;Patient/family education;Wheelchair mobility training;Passive range of motion    PT Next Visit Plan  Transfer to [employer] for exercise.  Perform seated UE and LE exercises with red theraband and give exercises for pt to do at home.  Seated bowling for anterior leaning.  Transfer to mat for standing frame.  So far using standing frame has been the most successful for getting her in standing.  Keep using standing frame to perform ball tosses, coloring, and any other activities she would be interested in for prolonged standing.      PT Home Exercise Plan  LE ROM and strengthening, standing activities    Consulted and Agree with Plan of Care  Patient;Family member/caregiver    Family Member Consulted  Habilitation tech from day program       Patient will benefit from skilled therapeutic  intervention in order to improve the following deficits and impairments:  Decreased activity tolerance, Decreased balance, Decreased endurance, Decreased mobility, Decreased range of motion, Decreased strength, Difficulty walking, Postural dysfunction  Visit Diagnosis: Muscle weakness (generalized)  Unsteadiness on feet  Difficulty in walking, not elsewhere classified  Repeated falls     Problem List Patient Active Problem List   Diagnosis Date Noted  . Osteopenia 06/23/2017  . Incontinence of urine in female 06/23/2017  . Vitamin D deficiency 03/19/2017  . At high risk for falls 03/19/2017  . Candidal intertrigo 08/28/2016  . Allergic rhinitis 02/28/2016  . Health care maintenance 07/27/2015  . Sinus bradycardia   . Dementia associated with other underlying disease with behavioral disturbance ([place]) 05/18/2015  . Down syndrome 03/24/2014  . Hyperlipidemia 03/24/2014  . Hypothyroidism 03/24/2014  . Sleep apnea 03/24/2014    Denice Bors 08/06/2018, 12:07 PM  [facility] [address] [place], [place],  [address] Phone: [phone]   Fax:  [phone]  Name: Amanda Castillo MRN: [id number] Date of Birth: [birth year]

## 2018-08-10 ENCOUNTER — Ambulatory Visit
Payer: Medicare Other | Attending: Student in an Organized Health Care Education/Training Program | Admitting: Rehabilitation

## 2018-08-10 ENCOUNTER — Encounter: Payer: Self-pay | Admitting: Rehabilitation

## 2018-08-10 DIAGNOSIS — R262 Difficulty in walking, not elsewhere classified: Secondary | ICD-10-CM

## 2018-08-10 DIAGNOSIS — R2681 Unsteadiness on feet: Secondary | ICD-10-CM | POA: Diagnosis present

## 2018-08-10 DIAGNOSIS — M6281 Muscle weakness (generalized): Secondary | ICD-10-CM | POA: Diagnosis present

## 2018-08-10 DIAGNOSIS — R296 Repeated falls: Secondary | ICD-10-CM | POA: Insufficient documentation

## 2018-08-10 NOTE — Therapy (Signed)
[facility] [address] [place], [place], [address] Phone: [phone]   Fax:  [phone]  Physical Therapy Treatment  Patient Details  Name: Amanda Castillo MRN: [id number] Date of Birth: Apr 03, 1956 Referring Provider (PT): Lorella Nimrod, MD; Axel Filler, MD   Encounter Date: 08/10/2018  PT End of Session - 08/10/18 1416    Visit Number  11    Number of Visits  17    Date for PT Re-Evaluation  08/29/18    Authorization Type  Medicare and Medicaid 10th visit PN    PT Start Time  1017    PT Stop Time  1058    PT Time Calculation (min)  41 min    Activity Tolerance  Patient tolerated treatment well    Behavior During Therapy  [facility] for tasks assessed/performed       Past Medical History:  Diagnosis Date  . Allergy   . Depression   . Down's syndrome   . Hallucinations   . Hyperlipidemia   . Hypothyroidism   . Moderate intellectual disability   . Orthostatic hypotension 06/06/2015  . PVD (peripheral vascular disease) ([place])   . Sleep apnea   . Stroke [facility])    in 2006    Past Surgical History:  Procedure Laterality Date  . COLONOSCOPY  06/12/2012  . orif lt foot    . TONSILLECTOMY      There were no vitals filed for this visit.  Subjective Assessment - 08/10/18 1415    Subjective  Pts caregiver reports she thinks that pt stood again over the weekend from bed.     Pertinent History  Down's Syndrome, CVA in 2006, sleep apnea, PVD, orthostatic hypotension, moderate intellectual disability, hypothyroidism, hyperlipidemia, hallucinations and depression    Limitations  Walking    Diagnostic tests  no acute findings    Patient Stated Goals  To begin walking again    Currently in Pain?  No/denies             TA:  Utilized standing frame during session in order to promote LE weight bearing while performing UE tasks for core activation as well.  Attempted to keep standing frame lowered during  tasks to have pt initiate standing on her own.  Pt did very sporadically actively perform UE task without support of elbows on table top, however remained inactive during 85% of tasks.  PT performed hand over hand B shoulder flex while holding ball, moving in diagonals, and tossing ball.  Again, pt would intermittent initiate tossing ball, however requires max encouragement to continue with task.  Also attempted to have pt hit balloon back and fourth to PT, and she did hit balloon actively x 3 reps, however pt changed hand placement to "clinging" onto frame table and would not let go for UE tasks.  Pt requires total A for stand pivot transfer to/from mat during session and would not initiate standing without frame today.  Discussed with caregiver that we would likely not continue past this POC due to lack of progress. Caregiver verbalized understanding.                   PT Education - 08/10/18 1416    Education Details  Educated pts caregiver that we would likely not continue past current POC due to lack of progress.      Person(s) Educated  Patient;Caregiver(s)    Methods  Explanation    Comprehension  Verbalized understanding  PT Short Term Goals - 08/06/18 1109      PT SHORT TERM GOAL #1   Title  Pt will participate in mat evaluation to assess transfers, sitting balance, LE ROM    Baseline  Pt able to sit at [facility] with feet supported intermittently at S-at times max A, Pt with tightness noted at most hip motions in supine today 08/07/18    Time  4    Period  Weeks    Status  Achieved    Target Date  07/30/18      PT SHORT TERM GOAL #2   Title  Pt will initiate supine and seated ROM/strengthening HEP with assistance of caregiver    Baseline  Initiated in previous sessions     Time  4    Period  Weeks    Status  Achieved    Target Date  07/30/18      PT SHORT TERM GOAL #3   Title  Pt will perform full sit > stand from wheelchair and will transfer with RW to mat with  mod A    Baseline  +2 total A    Time  4    Period  Weeks    Status  Not Met    Target Date  07/30/18      PT SHORT TERM GOAL #4   Title  Pt will maintain standing to participate in a leisure/recreational activity for 5-8 minutes with min A.    Baseline  met with standing frame     Time  4    Period  Weeks    Status  Achieved    Target Date  07/30/18        PT Long Term Goals - 06/30/18 1427      PT LONG TERM GOAL #1   Title  Pt and caregiver will demonstrate independence with final HEP and recommendations for safe walking    Time  8    Period  Weeks    Status  New    Target Date  08/29/18      PT LONG TERM GOAL #2   Title  Pt will perform sit <> stand from wheelchair and stand pivot with RW with min A    Time  8    Period  Weeks    Status  New    Target Date  08/29/18      PT LONG TERM GOAL #3   Title  Pt will maintain standing at table/counter to participate in leisure/recreational activities for 10-12 minutes    Time  8    Period  Weeks    Status  New    Target Date  08/29/18      PT LONG TERM GOAL #4   Title  Pt will ambulate x 50' with RW and moderate assistance on indoor, level surfaces    Time  8    Period  Weeks    Status  New    Target Date  08/29/18            Plan - 08/10/18 1417    Clinical Impression Statement  Utilized standing frame today for UE use in order to have pt engage LEs and core.  Pt requires hand over hand assist to perform tasks with only minimal active participation during session.  Attempted to keep standing frame slightly lowered so that she could engage further, however she simply leaned forward on frame and utilized UE support.     Rehab Potential  Fair    Clinical Impairments Affecting Rehab Potential  hasn't ambulated in a year, possible syncope vs. seizure episodes, moderate intellectual disability    PT Frequency  2x / week    PT Duration  8 weeks    PT Treatment/Interventions  ADLs/Self Care Home Management;DME  Instruction;Gait training;Stair training;Functional mobility training;Therapeutic activities;Therapeutic exercise;Balance training;Neuromuscular re-education;Patient/family education;Wheelchair mobility training;Passive range of motion    PT Next Visit Plan  Transfer to [employer] for exercise.  Perform seated UE and LE exercises with red theraband and give exercises for pt to do at home.  Seated bowling for anterior leaning.  Transfer to mat for standing frame.  So far using standing frame has been the most successful for getting her in standing.  Keep using standing frame to perform ball tosses, coloring, and any other activities she would be interested in for prolonged standing.      PT Home Exercise Plan  LE ROM and strengthening, standing activities    Consulted and Agree with Plan of Care  Patient;Family member/caregiver    Family Member Consulted  Habilitation tech from day program       Patient will benefit from skilled therapeutic intervention in order to improve the following deficits and impairments:  Decreased activity tolerance, Decreased balance, Decreased endurance, Decreased mobility, Decreased range of motion, Decreased strength, Difficulty walking, Postural dysfunction  Visit Diagnosis: Muscle weakness (generalized)  Unsteadiness on feet  Difficulty in walking, not elsewhere classified     Problem List Patient Active Problem List   Diagnosis Date Noted  . Osteopenia 06/23/2017  . Incontinence of urine in female 06/23/2017  . Vitamin D deficiency 03/19/2017  . At high risk for falls 03/19/2017  . Candidal intertrigo 08/28/2016  . Allergic rhinitis 02/28/2016  . Health care maintenance 07/27/2015  . Sinus bradycardia   . Dementia associated with other underlying disease with behavioral disturbance ([place]) 05/18/2015  . Down syndrome 03/24/2014  . Hyperlipidemia 03/24/2014  . Hypothyroidism 03/24/2014  . Sleep apnea 03/24/2014    Cameron Sprang, PT, MPT [facility] [address] [place], [place], [address] Phone: [phone]   Fax:  [phone] 08/10/18, 2:25 PM  Name: Amanda Castillo MRN: [id number] Date of Birth: 03-25-1956

## 2018-08-12 ENCOUNTER — Ambulatory Visit: Payer: Medicare Other | Admitting: Physical Therapy

## 2018-08-12 ENCOUNTER — Encounter: Payer: Self-pay | Admitting: Physical Therapy

## 2018-08-12 DIAGNOSIS — M6281 Muscle weakness (generalized): Secondary | ICD-10-CM

## 2018-08-12 DIAGNOSIS — R2681 Unsteadiness on feet: Secondary | ICD-10-CM

## 2018-08-12 DIAGNOSIS — R296 Repeated falls: Secondary | ICD-10-CM

## 2018-08-12 DIAGNOSIS — R262 Difficulty in walking, not elsewhere classified: Secondary | ICD-10-CM

## 2018-08-12 NOTE — Therapy (Signed)
[facility] [address] [place], [place], [address] Phone: [phone]   Fax:  [phone]  Physical Therapy Treatment  Patient Details  Name: Amanda Castillo MRN: [id number] Date of Birth: 1955/08/18 Referring Provider (PT): Lorella Nimrod, MD; Axel Filler, MD   Encounter Date: 08/12/2018  PT End of Session - 08/12/18 1223    Visit Number  12    Number of Visits  17    Date for PT Re-Evaluation  08/29/18    Authorization Type  Medicare and Medicaid 10th visit PN    PT Start Time  1015    PT Stop Time  1100    PT Time Calculation (min)  45 min    Activity Tolerance  Patient tolerated treatment well    Behavior During Therapy  [facility] for tasks assessed/performed       Past Medical History:  Diagnosis Date  . Allergy   . Depression   . Down's syndrome   . Hallucinations   . Hyperlipidemia   . Hypothyroidism   . Moderate intellectual disability   . Orthostatic hypotension 06/06/2015  . PVD (peripheral vascular disease) ([place])   . Sleep apnea   . Stroke [facility])    in 2006    Past Surgical History:  Procedure Laterality Date  . COLONOSCOPY  06/12/2012  . orif lt foot    . TONSILLECTOMY      There were no vitals filed for this visit.  Subjective Assessment - 08/12/18 1015    Subjective  No falls. No changes in Kamisha's actvities at the group home. She has not spontaneously fallen again.     Pertinent History  Down's Syndrome, CVA in 2006, sleep apnea, PVD, orthostatic hypotension, moderate intellectual disability, hypothyroidism, hyperlipidemia, hallucinations and depression    Limitations  Walking    Diagnostic tests  no acute findings    Patient Stated Goals  To begin walking again    Currently in Pain?  No/denies      Therapeutic Activities: Standing frame for 20 minutes: PT encouraging upper body / trunk activity with reaching activities. PT attempted lowering seat & facilitating pt to stand for  last 20-30* hip extension but unable to get patient to perform.  Sitting in w/c with arms supported on armrests: small knee extension with kicking ball, larger motion requires active assist, marching small motions (<10*) with encouragement and larger motions with assistance. Seated upper body horizontal abduction with red theraband & active assist lifting ball to eye level.                            PT Short Term Goals - 08/06/18 1109      PT SHORT TERM GOAL #1   Title  Pt will participate in mat evaluation to assess transfers, sitting balance, LE ROM    Baseline  Pt able to sit at [facility] with feet supported intermittently at S-at times max A, Pt with tightness noted at most hip motions in supine today 08/07/18    Time  4    Period  Weeks    Status  Achieved    Target Date  07/30/18      PT SHORT TERM GOAL #2   Title  Pt will initiate supine and seated ROM/strengthening HEP with assistance of caregiver    Baseline  Initiated in previous sessions     Time  4    Period  Weeks  Status  Achieved    Target Date  07/30/18      PT SHORT TERM GOAL #3   Title  Pt will perform full sit > stand from wheelchair and will transfer with RW to mat with mod A    Baseline  +2 total A    Time  4    Period  Weeks    Status  Not Met    Target Date  07/30/18      PT SHORT TERM GOAL #4   Title  Pt will maintain standing to participate in a leisure/recreational activity for 5-8 minutes with min A.    Baseline  met with standing frame     Time  4    Period  Weeks    Status  Achieved    Target Date  07/30/18        PT Long Term Goals - 06/30/18 1427      PT LONG TERM GOAL #1   Title  Pt and caregiver will demonstrate independence with final HEP and recommendations for safe walking    Time  8    Period  Weeks    Status  New    Target Date  08/29/18      PT LONG TERM GOAL #2   Title  Pt will perform sit <> stand from wheelchair and stand pivot with RW with min A    Time   8    Period  Weeks    Status  New    Target Date  08/29/18      PT LONG TERM GOAL #3   Title  Pt will maintain standing at table/counter to participate in leisure/recreational activities for 10-12 minutes    Time  8    Period  Weeks    Status  New    Target Date  08/29/18      PT LONG TERM GOAL #4   Title  Pt will ambulate x 50' with RW and moderate assistance on indoor, level surfaces    Time  8    Period  Weeks    Status  New    Target Date  08/29/18            Plan - 08/12/18 1223    Clinical Impression Statement  Patient tolerated 20 minutes in standing frame with PT facilitating upper body /trunk engagement with reaching. Patient requires significant assist with skilled needed to encourage minimal engagement.     Rehab Potential  Fair    Clinical Impairments Affecting Rehab Potential  hasn't ambulated in a year, possible syncope vs. seizure episodes, moderate intellectual disability    PT Frequency  2x / week    PT Duration  8 weeks    PT Treatment/Interventions  ADLs/Self Care Home Management;DME Instruction;Gait training;Stair training;Functional mobility training;Therapeutic activities;Therapeutic exercise;Balance training;Neuromuscular re-education;Patient/family education;Wheelchair mobility training;Passive range of motion    PT Next Visit Plan  Perform seated UE and LE exercises with red theraband and give exercises for pt to do at home.  Seated bowling for anterior leaning.  Transfer to mat for standing frame.  So far using standing frame has been the most successful for getting her in standing.  Keep using standing frame to perform ball tosses, coloring, and any other activities she would be interested in for prolonged standing.      PT Home Exercise Plan  LE ROM and strengthening, standing activities    Consulted and Agree with Plan of Care  Patient;Family member/caregiver  Family Member Consulted  Habilitation tech from day program       Patient will benefit  from skilled therapeutic intervention in order to improve the following deficits and impairments:  Decreased activity tolerance, Decreased balance, Decreased endurance, Decreased mobility, Decreased range of motion, Decreased strength, Difficulty walking, Postural dysfunction  Visit Diagnosis: Muscle weakness (generalized)  Unsteadiness on feet  Difficulty in walking, not elsewhere classified  Repeated falls     Problem List Patient Active Problem List   Diagnosis Date Noted  . Osteopenia 06/23/2017  . Incontinence of urine in female 06/23/2017  . Vitamin D deficiency 03/19/2017  . At high risk for falls 03/19/2017  . Candidal intertrigo 08/28/2016  . Allergic rhinitis 02/28/2016  . Health care maintenance 07/27/2015  . Sinus bradycardia   . Dementia associated with other underlying disease with behavioral disturbance ([place]) 05/18/2015  . Down syndrome 03/24/2014  . Hyperlipidemia 03/24/2014  . Hypothyroidism 03/24/2014  . Sleep apnea 03/24/2014    Jamey Reas PT, DPT 08/12/2018, 12:26 PM  [facility] [address] [place], [place], [address] Phone: [phone]   Fax:  [phone]  Name: GABRIEL PAULDING MRN: [id number] Date of Birth: 11-27-1955

## 2018-08-13 ENCOUNTER — Other Ambulatory Visit: Payer: Self-pay

## 2018-08-13 ENCOUNTER — Encounter: Payer: Self-pay | Admitting: Internal Medicine

## 2018-08-13 ENCOUNTER — Ambulatory Visit (INDEPENDENT_AMBULATORY_CARE_PROVIDER_SITE_OTHER): Payer: Medicare Other | Admitting: Internal Medicine

## 2018-08-13 DIAGNOSIS — Z79890 Hormone replacement therapy (postmenopausal): Secondary | ICD-10-CM

## 2018-08-13 DIAGNOSIS — E559 Vitamin D deficiency, unspecified: Secondary | ICD-10-CM | POA: Diagnosis not present

## 2018-08-13 DIAGNOSIS — Z79899 Other long term (current) drug therapy: Secondary | ICD-10-CM

## 2018-08-13 DIAGNOSIS — Q909 Down syndrome, unspecified: Secondary | ICD-10-CM

## 2018-08-13 DIAGNOSIS — E039 Hypothyroidism, unspecified: Secondary | ICD-10-CM | POA: Diagnosis present

## 2018-08-13 DIAGNOSIS — Z9181 History of falling: Secondary | ICD-10-CM | POA: Diagnosis not present

## 2018-08-13 NOTE — Patient Instructions (Signed)
Thank you for allowing [place] to provide your care today.  Your vital sign and physical exam today were normal.  Today we discussed, your previous lab results including cholesterol and thyroid hormone and vitamin D that all were within normal range.  Please continue taking your medications as before. Please continue to follow the process for getting hospital bed with rails due to previous history of falling.   Please follow-up in 6 months or sooner as needed.  Should you have any questions or concerns please call the internal medicine clinic at [phone].  Thank you.

## 2018-08-13 NOTE — Progress Notes (Signed)
   CC: Hypothyroidism follow-up  HPI:  [name] is a 63 y.o. female with past medical history as listed below, came into clinic today for follow-up of her chronic conditions.  Please refer to problem based charting for further details and assessment and plan.  Past Medical History:  Diagnosis Date  . Allergy   . Depression   . Down's syndrome   . Hallucinations   . Hyperlipidemia   . Hypothyroidism   . Moderate intellectual disability   . Orthostatic hypotension 06/06/2015  . PVD (peripheral vascular disease) ([place])   . Sleep apnea   . Stroke [facility])    in 2006   Review of Systems:  Review of Systems  Constitutional: Negative for chills and fever.  Respiratory: Negative for cough and shortness of breath.   Gastrointestinal: Negative for abdominal pain, diarrhea, nausea and vomiting.    Physical Exam:  Vitals:   08/13/18 1322  BP: 102/76  Pulse: 62  Temp: 97.8 F (36.6 C)  TempSrc: Oral  SpO2: 100%  Height: 4\' 10"  (1.473 m)   Physical exam: Vital signs reviewed. General: Pleasant female, sitting on the wheelchair no acute distress. Head and neck:  Down syndrome appearance CV: RRR, normal S1-S2 Lung exam: CTA bilaterally, no wheezing, no crackle, no respiratory distress Abdomen: Is soft and nontender to palpation, bowel sounds are normal Extremities: No lower extremity edema, pulses are palpable bilaterally Neurologic exam: Patient is alert and at baseline, she moves all of her extremities without gross focal neurological deficit Skin: Warm and dry  Assessment & Plan:   See Encounters Tab for problem based charting.  Patient discussed with Dr. Dareen Piano

## 2018-08-13 NOTE — Assessment & Plan Note (Addendum)
Last TSH (04/15/2018) was 2.74 (normal, at therapeutic range). Patient has been on Synthroid 62.5 MCG daily.  -Continue Levothyroxine 62.5 mcg QD -f/u in clinic in 6 months

## 2018-08-14 NOTE — Assessment & Plan Note (Signed)
Vitamin D (25-hydroxy) came back 31 (normal range 30-100). -We will continue calcium-vitamin D 1200-2000. (May increase in future as needed)

## 2018-08-14 NOTE — Assessment & Plan Note (Signed)
Patient did not have any further falling. Caregiver endorses that they are in process of getting the hospital bed with rails.  She mentions that was given paperwork to carry 2 weeks ago waiting to receive that bed. She is following the process.

## 2018-08-17 ENCOUNTER — Ambulatory Visit: Payer: Medicare Other | Admitting: Physical Therapy

## 2018-08-17 NOTE — Progress Notes (Signed)
Internal Medicine Clinic Attending  Case discussed with Dr. Masoudi  at the time of the visit.  We reviewed the resident's history and exam and pertinent patient test results.  I agree with the assessment, diagnosis, and plan of care documented in the resident's note.  

## 2018-08-20 ENCOUNTER — Ambulatory Visit: Payer: Medicare Other | Admitting: Physical Therapy

## 2018-08-20 DIAGNOSIS — M6281 Muscle weakness (generalized): Secondary | ICD-10-CM | POA: Diagnosis not present

## 2018-08-20 DIAGNOSIS — R296 Repeated falls: Secondary | ICD-10-CM

## 2018-08-20 DIAGNOSIS — R2681 Unsteadiness on feet: Secondary | ICD-10-CM

## 2018-08-20 DIAGNOSIS — R262 Difficulty in walking, not elsewhere classified: Secondary | ICD-10-CM

## 2018-08-20 NOTE — Therapy (Addendum)
[facility] [address] [place], [place], [address] Phone: [phone]   Fax:  [phone]  Physical Therapy Treatment  Patient Details  Name: Amanda Castillo MRN: [id number] Date of Birth: March 31, 1956 Referring Provider (PT): Lorella Nimrod, MD; Axel Filler, MD   Encounter Date: 08/20/2018  PT End of Session - 08/20/18 1203    Visit Number  13    Number of Visits  17    Date for PT Re-Evaluation  08/29/18    Authorization Type  Medicare and Medicaid 10th visit PN    PT Start Time  1105    PT Stop Time  1145    PT Time Calculation (min)  40 min    Activity Tolerance  Patient limited by lethargy    Behavior During Therapy  Flat affect       Past Medical History:  Diagnosis Date   Allergy    Depression    Down's syndrome    Hallucinations    Hyperlipidemia    Hypothyroidism    Moderate intellectual disability    Orthostatic hypotension 06/06/2015   PVD (peripheral vascular disease) ([place])    Sleep apnea    Stroke ([place])    in 2006    Past Surgical History:  Procedure Laterality Date   COLONOSCOPY  06/12/2012   orif lt foot     TONSILLECTOMY      There were no vitals filed for this visit.  Subjective Assessment - 08/20/18 1149    Subjective  Pt leaning heavily to L in wheelchair.  Day program aide notes that pt has been this way multiple days in a row.  Asking if pt needs to come to appointments next week.    Pertinent History  Down's Syndrome, CVA in 2006, sleep apnea, PVD, orthostatic hypotension, moderate intellectual disability, hypothyroidism, hyperlipidemia, hallucinations and depression    Limitations  Walking    Diagnostic tests  no acute findings    Patient Stated Goals  To begin walking again    Currently in Pain?  No/denies                       [facility] Adult PT Treatment/Exercise - 08/20/18 1150      Therapeutic Activites    Therapeutic Activities   Other Therapeutic Activities    Other Therapeutic Activities  Discussed with habilitation aide D/C today vs. next week.  Discussed lack of progress towards goals and role of dementia in patient's decreased mobility.  Due to lack of progress towards goals PT feels pt is ready to D/C today.  Aide contacted pt's caregiver who was agreeable to D/C today.  Also performed general neuro screen due to patient's posture and decreased arousal/alertness.  Unable to assess BP and no significant neurological findings.  Advised aide that if pt worsened or became unresponsive to alert caregiver to take pt to physician or ED.  Pt unable to participate in home exercises due to decreased attention and alertness; demonstrated exercises to aide and provided hand out and theraband to take home        Marching    Alternate lifting knees as high as is comfortable, as if marching. Repeat _10__ times each leg. Do _1-2__ sessions per day. Note: If possible place feet on floor.    KNEE: Extension, Long Arc Quads - Sitting    Raise leg until knee is straight. _10__ reps per set, 2___ sets per day, _5-7__ days per week  Copyright  VHI. All rights reserved.   Isometric Hip Adduction    With towel rolled between knees (ball, pillow, etc), press thighs together. Hold _5___ seconds while counting out loud. Repeat _10___ times. Do __2__ sessions per day.     Diona Foley toss:  Have her sit on the edge of her wheelchair (place w/c against wall and make sure its locked).  Toss ball to her and have her toss back.  Try and get her to stretch her arms out when tossing.     Sitting Leg Press: Bilateral (Manual Resistance)    Sit, one leg lifted and bent. Against resistance, straighten legs. Complete _2__ sets of _10__ repetitions.    External Rotation: Hip - Knees Apart (Hook-Lying)    Sitting in chair band tied just above knees. Repeat _10__ times. Do _2__ times a day.   Press: Single Arm Over  Head    Hold ball in both hands.  Raise ball over head. Hold momentarily. Return slowly. Repeat _10__ times    Resistance: Arm Punch (Alternate)    Wrap band around chair. Push one arm forward. Fully straighten elbow and push shoulder forward as far as possible. Push other arm forward as first arm returns.  Repeat _10__ times each arm, alternating, per set.        PT Education - 08/20/18 1202    Education Details  see TA, HEP    Person(s) Educated  Patient    Methods  Explanation;Handout;Demonstration    Comprehension  Verbalized understanding       PT Short Term Goals - 08/06/18 1109      PT SHORT TERM GOAL #1   Title  Pt will participate in mat evaluation to assess transfers, sitting balance, LE ROM    Baseline  Pt able to sit at [facility] with feet supported intermittently at S-at times max A, Pt with tightness noted at most hip motions in supine today 08/07/18    Time  4    Period  Weeks    Status  Achieved    Target Date  07/30/18      PT SHORT TERM GOAL #2   Title  Pt will initiate supine and seated ROM/strengthening HEP with assistance of caregiver    Baseline  Initiated in previous sessions     Time  4    Period  Weeks    Status  Achieved    Target Date  07/30/18      PT SHORT TERM GOAL #3   Title  Pt will perform full sit > stand from wheelchair and will transfer with RW to mat with mod A    Baseline  +2 total A    Time  4    Period  Weeks    Status  Not Met    Target Date  07/30/18      PT SHORT TERM GOAL #4   Title  Pt will maintain standing to participate in a leisure/recreational activity for 5-8 minutes with min A.    Baseline  met with standing frame     Time  4    Period  Weeks    Status  Achieved    Target Date  07/30/18        PT Long Term Goals - 08/20/18 1205      PT LONG TERM GOAL #1   Title  Pt and caregiver will demonstrate independence with final HEP and recommendations for safe walking    Time  8    Period  Weeks    Status  Not  Met      PT LONG TERM GOAL #2   Title  Pt will perform sit <> stand from wheelchair and stand pivot with RW with min A    Time  8    Period  Weeks    Status  Not Met      PT LONG TERM GOAL #3   Title  Pt will maintain standing at table/counter to participate in leisure/recreational activities for 10-12 minutes    Time  8    Period  Weeks    Status  Not Met      PT LONG TERM GOAL #4   Title  Pt will ambulate x 50' with RW and moderate assistance on indoor, level surfaces    Time  8    Period  Weeks    Status  Not Met            Plan - 08/20/18 1203    Clinical Impression Statement  Pt unable to participate or tolerated seated UE and LE exercises as review of HEP.  Provided hand out to aide and demonstrated each exercise to aide while also providing ongoing education regarding patient's variable mobility, mental status and behavior.  Due to lack of progress pt to D/C today; caregiver agrees and feels that therapy is no longer necessary.  No LTG met.    Rehab Potential  Fair    Clinical Impairments Affecting Rehab Potential  hasn't ambulated in a year, possible syncope vs. seizure episodes, moderate intellectual disability    PT Frequency  2x / week    PT Duration  8 weeks    PT Treatment/Interventions  ADLs/Self Care Home Management;DME Instruction;Gait training;Stair training;Functional mobility training;Therapeutic activities;Therapeutic exercise;Balance training;Neuromuscular re-education;Patient/family education;Wheelchair mobility training;Passive range of motion    PT Home Exercise Plan  LE ROM and strengthening, standing activities    Consulted and Agree with Plan of Care  Patient;Family member/caregiver    Family Member Consulted  Habilitation tech from day program       Patient will benefit from skilled therapeutic intervention in order to improve the following deficits and impairments:  Decreased activity tolerance, Decreased balance, Decreased endurance, Decreased  mobility, Decreased range of motion, Decreased strength, Difficulty walking, Postural dysfunction  Visit Diagnosis: Muscle weakness (generalized)  Unsteadiness on feet  Difficulty in walking, not elsewhere classified  Repeated falls     Problem List Patient Active Problem List   Diagnosis Date Noted   Osteopenia 06/23/2017   Incontinence of urine in female 06/23/2017   Vitamin D deficiency 03/19/2017   At high risk for falls 03/19/2017   Candidal intertrigo 08/28/2016   Allergic rhinitis 02/28/2016   Health care maintenance 07/27/2015   Sinus bradycardia    Dementia associated with other underlying disease with behavioral disturbance ([place]) 05/18/2015   Down syndrome 03/24/2014   Hyperlipidemia 03/24/2014   Hypothyroidism 03/24/2014   Sleep apnea 03/24/2014   PHYSICAL THERAPY DISCHARGE SUMMARY  Visits from Start of Care: 13  Current functional level related to goals / functional outcomes: See LTG and impression statement above.  No LTG met.  Pt demonstrated variable alertness/arousal and mobility at each visit and little to no carry over between sessions despite attempt to keep therapist and functional activities consistent at each visit.  Caregiver felt therapy was no longer appropriate.     Remaining deficits: Impaired strength, impaired initiation, impaired balance, high falls risk, inability to ambulate   Education / Equipment: HEP, RW  if pt did progress to ambulating, new more appropriate fitting wheelchair  Plan: Patient agrees to discharge.  Patient goals were not met. Patient is being discharged due to lack of progress.  ?????     Rico Junker, PT, DPT 08/20/18    12:07 PM    [facility] [address] [place], [place], [address] Phone: [phone]   Fax:  [phone]  Name: Amanda Castillo MRN: [id number] Date of Birth: Feb 27, 1956

## 2018-08-20 NOTE — Patient Instructions (Addendum)
Marching    Alternate lifting knees as high as is comfortable, as if marching. Repeat _10__ times each leg. Do _1-2__ sessions per day. Note: If possible place feet on floor.    KNEE: Extension, Long Arc Quads - Sitting    Raise leg until knee is straight. _10__ reps per set, 2___ sets per day, _5-7__ days per week  Copyright  VHI. All rights reserved.   Isometric Hip Adduction    With towel rolled between knees (ball, pillow, etc), press thighs together. Hold _5___ seconds while counting out loud. Repeat _10___ times. Do __2__ sessions per day.     Diona Foley toss:  Have her sit on the edge of her wheelchair (place w/c against wall and make sure its locked).  Toss ball to her and have her toss back.  Try and get her to stretch her arms out when tossing.     Sitting Leg Press: Bilateral (Manual Resistance)    Sit, one leg lifted and bent. Against resistance, straighten legs. Complete _2__ sets of _10__ repetitions.    External Rotation: Hip - Knees Apart (Hook-Lying)    Sitting in chair band tied just above knees. Repeat _10__ times. Do _2__ times a day.   Press: Single Arm Over Head    Hold ball in both hands.  Raise ball over head. Hold momentarily. Return slowly. Repeat _10__ times    Resistance: Arm Punch (Alternate)    Wrap band around chair. Push one arm forward. Fully straighten elbow and push shoulder forward as far as possible. Push other arm forward as first arm returns.  Repeat _10__ times each arm, alternating, per set.

## 2018-08-24 ENCOUNTER — Ambulatory Visit: Payer: Medicare Other | Admitting: Physical Therapy

## 2018-08-27 ENCOUNTER — Ambulatory Visit: Payer: Medicare Other | Admitting: Physical Therapy

## 2018-09-09 ENCOUNTER — Other Ambulatory Visit: Payer: Self-pay | Admitting: Internal Medicine

## 2018-09-11 ENCOUNTER — Other Ambulatory Visit: Payer: Self-pay | Admitting: Internal Medicine

## 2018-09-11 ENCOUNTER — Encounter: Payer: Self-pay | Admitting: Internal Medicine

## 2018-09-11 NOTE — Telephone Encounter (Signed)
Next appt scheduled 9/10 with PCP. 

## 2018-09-16 ENCOUNTER — Telehealth: Payer: Self-pay

## 2018-09-16 NOTE — Telephone Encounter (Signed)
Needs to speak with a nurse about hospital bed.

## 2018-09-17 NOTE — Telephone Encounter (Signed)
[place], Lenon Ahmadi, [place], [place]; [place], [place]; [place], Samuel [place]        Thank you for that information, We do have the order for the bed, trapeze, and hoyer, however, we need the last office visit note addended to state the below  Patient requires re-positioning of the body in ways that cannot be achieved with an ordinary bed or wedge pillow, to eliminate pain, reduce pressure, or treat existence of pressure sores.    Patient requires the head of the bed to be elevated at least 30 degrees or more most of the time.    Patient requires frequent / immediate changes in body positions which cannot be achieved with a normal bed.    Dr. Reesa Chew, insurance requires the above information documented in pt's chart.  Please add the information above to 06/01/18 office visit with patient if appropriate.London Pepper, Harmani Neto C4/9/202010:08 AM

## 2018-09-17 NOTE — Telephone Encounter (Signed)
I did addeted my note from June 01, 2018. Let me know if you need anything else.

## 2018-09-21 NOTE — Telephone Encounter (Signed)
Community Message sent to Jolee Ewing at [employer] that note was addended. Received return message that Levada Dy has the addended note. Hubbard Hartshorn, RN, BSN

## 2018-11-17 ENCOUNTER — Telehealth: Payer: Self-pay | Admitting: Internal Medicine

## 2018-11-17 NOTE — Telephone Encounter (Signed)
Rec'd call from [facility] requesting a DME order for Poise Inserts for the pt's Pull-ups.  Pt is urinating threw her Chucks.  Caretaker is requesting Covidien Under pads  Instead of the Chucks if possible.  Pt uses [facility] for her Supplies fax Number [phone].  Please call care taker back.

## 2018-11-17 NOTE — Telephone Encounter (Signed)
Comunity Message sent to Amanda Castillo at [facility] asking if they carry these supplies and if patient will require a F2F for this. Hubbard Hartshorn, RN, BSN

## 2018-11-18 NOTE — Telephone Encounter (Signed)
Following messages received from Jolee Ewing at [facility]:  I have been advised that they would only be covered if they were a CAP patient (Community Alternative Patient - Medicaid doesn't cover them, nor does any other insurance.   My son is disabled and has chux pads. I can suggest possibly using a shower curtain liner on bed. We got his inserts approved thru CAP program. Here is the link [email or web address]

## 2018-11-18 NOTE — Telephone Encounter (Signed)
Called and spoke to caregiver Cordella Register for patient Amanda Castillo was not able to get the supplies patient needed unless they were covered by CAP programs [place], [place] C6/10/20203:14 PM

## 2018-12-05 ENCOUNTER — Other Ambulatory Visit: Payer: Self-pay | Admitting: Internal Medicine

## 2018-12-09 ENCOUNTER — Other Ambulatory Visit: Payer: Self-pay | Admitting: *Deleted

## 2018-12-09 NOTE — Telephone Encounter (Signed)
FAX: [phone], attn Cordella Register  [facility] in group home needs a current medlist with dr's signature on it, could you please print it out and sign it, front office or triage should be able to fax to the fax# listed in this note

## 2018-12-14 MED ORDER — ENSURE ENLIVE PO LIQD: 237.0000 mL | Freq: Three times a day (TID) | ORAL | 12 refills | Status: DC

## 2018-12-14 NOTE — Telephone Encounter (Signed)
I signed the list and left it at front desk to be faxed last week. Thank you

## 2018-12-23 ENCOUNTER — Encounter: Payer: Self-pay | Admitting: *Deleted

## 2019-01-21 ENCOUNTER — Other Ambulatory Visit: Payer: Self-pay

## 2019-01-21 ENCOUNTER — Ambulatory Visit (INDEPENDENT_AMBULATORY_CARE_PROVIDER_SITE_OTHER): Payer: Medicare Other | Admitting: Internal Medicine

## 2019-01-21 VITALS — BP 109/62 | HR 74 | Temp 97.8°F | Ht <= 58 in

## 2019-01-21 DIAGNOSIS — Z79899 Other long term (current) drug therapy: Secondary | ICD-10-CM

## 2019-01-21 DIAGNOSIS — Q909 Down syndrome, unspecified: Secondary | ICD-10-CM | POA: Diagnosis not present

## 2019-01-21 DIAGNOSIS — B372 Candidiasis of skin and nail: Secondary | ICD-10-CM

## 2019-01-21 DIAGNOSIS — Z0000 Encounter for general adult medical examination without abnormal findings: Secondary | ICD-10-CM | POA: Diagnosis not present

## 2019-01-21 DIAGNOSIS — L89302 Pressure ulcer of unspecified buttock, stage 2: Secondary | ICD-10-CM | POA: Diagnosis not present

## 2019-01-21 DIAGNOSIS — E785 Hyperlipidemia, unspecified: Secondary | ICD-10-CM

## 2019-01-21 LAB — POCT GLYCOSYLATED HEMOGLOBIN (HGB A1C): Hemoglobin A1C: 5.5 % (ref 4.0–5.6)

## 2019-01-21 LAB — GLUCOSE, CAPILLARY: Glucose-Capillary: 89 mg/dL (ref 70–99)

## 2019-01-21 MED ORDER — NYSTATIN 100000 UNIT/GM EX POWD: Freq: Four times a day (QID) | CUTANEOUS | 3 refills | Status: DC

## 2019-01-21 NOTE — Progress Notes (Signed)
   CC: Hypertension follow up  HPI:  [name] is a 63 y.o. female with PMHx as documented below, presented for follow up of hypertension and intertrigo candida infection. Please refer to problem based charting for further details and assessment and plan of current problem and chronic medical conditions.   Past Medical History:  Diagnosis Date  . Allergy   . Depression   . Down's syndrome   . Hallucinations   . Hyperlipidemia   . Hypothyroidism   . Moderate intellectual disability   . Orthostatic hypotension 06/06/2015  . PVD (peripheral vascular disease) ([place])   . Sleep apnea   . Stroke [facility])    in 2006   Review of Systems:  Review of Systems  Constitutional: Negative for chills and fever.       Deconditioning  Respiratory: Negative for cough.   Skin: Positive for rash.     Physical Exam:  Vitals:   01/21/19 1204 01/21/19 1206  BP: (!) 78/38 109/62  Pulse: 90 74  Temp: 97.8 F (36.6 C)   TempSrc: Oral   SpO2: 100%   Height: 4\' 10"  (1.473 m)    Physical Exam Constitutional:      Comments: Down syndrome appearance  Cardiovascular:     Rate and Rhythm: Normal rate and regular rhythm.  Abdominal:     General: Bowel sounds are normal.     Palpations: Abdomen is soft.  Skin:    Findings: Erythema and rash present.     Comments: Under breasts, and buttocks      Assessment & Plan:   See Encounters Tab for problem based charting.  Patient discussed with Dr. Rebeca Alert

## 2019-01-21 NOTE — Patient Instructions (Addendum)
It was our pleasure taking care of you in our clinic today.   Use the powder for the fungal infection. Avoid putting on bra and tight cloths. Please ambulate as much as possible to prevent worsening of bed sore. I send you to lab for some blood work. Please come back to clinic in 2 weeks or earlier if your symptoms get worse or not improved. As always, if having severe symptoms: fever, chills, worsening of ulcer, please seek medical attention at emergency room.  I did not make any changes with rest of your medications today. Please take them as before and contact us if you have any question or concern.   Thank you

## 2019-01-22 LAB — CMP14 + ANION GAP
ALT: 34 IU/L — ABNORMAL HIGH (ref 0–32)
AST: 25 IU/L (ref 0–40)
Albumin/Globulin Ratio: 1.6 (ref 1.2–2.2)
Albumin: 3.9 g/dL (ref 3.8–4.8)
Alkaline Phosphatase: 80 IU/L (ref 39–117)
Anion Gap: 20 mmol/L — ABNORMAL HIGH (ref 10.0–18.0)
BUN/Creatinine Ratio: 11 — ABNORMAL LOW (ref 12–28)
BUN: 11 mg/dL (ref 8–27)
Bilirubin Total: 0.3 mg/dL (ref 0.0–1.2)
CO2: 22 mmol/L (ref 20–29)
Calcium: 9.3 mg/dL (ref 8.7–10.3)
Chloride: 103 mmol/L (ref 96–106)
Creatinine, Ser: 0.96 mg/dL (ref 0.57–1.00)
GFR calc Af Amer: 74 mL/min/{1.73_m2} (ref >59)
GFR calc non Af Amer: 64 mL/min/{1.73_m2} (ref >59)
Globulin, Total: 2.5 g/dL (ref 1.5–4.5)
Glucose: 101 mg/dL — ABNORMAL HIGH (ref 65–99)
Potassium: 4.3 mmol/L (ref 3.5–5.2)
Sodium: 145 mmol/L — ABNORMAL HIGH (ref 134–144)
Total Protein: 6.4 g/dL (ref 6.0–8.5)

## 2019-01-22 LAB — CBC
Hematocrit: 43.6 % (ref 34.0–46.6)
Hemoglobin: 14.6 g/dL (ref 11.1–15.9)
MCH: 31 pg (ref 26.6–33.0)
MCHC: 33.5 g/dL (ref 31.5–35.7)
MCV: 93 fL (ref 79–97)
Platelets: 261 10*3/uL (ref 150–450)
RBC: 4.71 x10E6/uL (ref 3.77–5.28)
RDW: 14.7 % (ref 11.7–15.4)
WBC: 5.9 10*3/uL (ref 3.4–10.8)

## 2019-01-22 LAB — LIPID PANEL
Chol/HDL Ratio: 2.9 ratio (ref 0.0–4.4)
Cholesterol, Total: 144 mg/dL (ref 100–199)
HDL: 49 mg/dL (ref >39)
LDL Calculated: 77 mg/dL (ref 0–99)
Triglycerides: 91 mg/dL (ref 0–149)
VLDL Cholesterol Cal: 18 mg/dL (ref 5–40)

## 2019-01-24 ENCOUNTER — Encounter: Payer: Self-pay | Admitting: Internal Medicine

## 2019-01-24 DIAGNOSIS — Z79899 Other long term (current) drug therapy: Secondary | ICD-10-CM | POA: Insufficient documentation

## 2019-01-24 DIAGNOSIS — L8990 Pressure ulcer of unspecified site, unspecified stage: Secondary | ICD-10-CM | POA: Insufficient documentation

## 2019-01-24 NOTE — Assessment & Plan Note (Signed)
Checking Lipid panel today.

## 2019-01-24 NOTE — Assessment & Plan Note (Signed)
This is an acute on chronic condition that got worse since patient ran out of Nystatin. She has candida lesion in erythematous base under her breast also, lesions at her buttocks.  -Sent refill for Nystatin powder -Instructed to avoid putting on bra or tight dress

## 2019-01-24 NOTE — Assessment & Plan Note (Signed)
Checking labs today (Per patient's psychiatrist requests)

## 2019-01-24 NOTE — Assessment & Plan Note (Signed)
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 -Dressing placed and will follow up in 2 weeks -Encouraged to ambulate as much as possible

## 2019-01-24 NOTE — Assessment & Plan Note (Signed)
Her care giver reports some deterioration in patient's activity. She spends most of the time in bed, since she got the hospital bed.  Encouraged to ambulate her and walk her as much as possible.

## 2019-01-25 NOTE — Progress Notes (Signed)
Internal Medicine Clinic Attending  Case discussed with Dr. Masoudi at the time of the visit.  We reviewed the resident's history and exam and pertinent patient test results.  I agree with the assessment, diagnosis, and plan of care documented in the resident's note.  Alexander Raines, M.D., Ph.D.  

## 2019-02-03 ENCOUNTER — Other Ambulatory Visit: Payer: Self-pay

## 2019-02-03 ENCOUNTER — Ambulatory Visit (INDEPENDENT_AMBULATORY_CARE_PROVIDER_SITE_OTHER): Payer: Medicare Other | Admitting: Internal Medicine

## 2019-02-03 DIAGNOSIS — F0281 Dementia in other diseases classified elsewhere with behavioral disturbance: Secondary | ICD-10-CM

## 2019-02-03 DIAGNOSIS — Q909 Down syndrome, unspecified: Secondary | ICD-10-CM

## 2019-02-03 DIAGNOSIS — R32 Unspecified urinary incontinence: Secondary | ICD-10-CM

## 2019-02-03 DIAGNOSIS — L89159 Pressure ulcer of sacral region, unspecified stage: Secondary | ICD-10-CM | POA: Diagnosis present

## 2019-02-03 DIAGNOSIS — L89302 Pressure ulcer of unspecified buttock, stage 2: Secondary | ICD-10-CM

## 2019-02-03 DIAGNOSIS — F02818 Dementia in other diseases classified elsewhere, unspecified severity, with other behavioral disturbance: Secondary | ICD-10-CM

## 2019-02-03 DIAGNOSIS — R159 Full incontinence of feces: Secondary | ICD-10-CM

## 2019-02-03 DIAGNOSIS — B372 Candidiasis of skin and nail: Secondary | ICD-10-CM

## 2019-02-03 MED ORDER — TERBINAFINE HCL 1 % EX CREA: 1.0000 "application " | TOPICAL_CREAM | Freq: Two times a day (BID) | CUTANEOUS | 1 refills | Status: AC | PRN

## 2019-02-03 MED ORDER — NYSTATIN 100000 UNIT/GM EX POWD: Freq: Four times a day (QID) | CUTANEOUS | 3 refills | Status: DC

## 2019-02-03 NOTE — Progress Notes (Signed)
Internal Medicine Clinic Attending  Case discussed with Dr. Bloomfield at the time of the visit.  We reviewed the resident's history and exam and pertinent patient test results.  I agree with the assessment, diagnosis, and plan of care documented in the resident's note.  

## 2019-02-03 NOTE — Progress Notes (Signed)
   CC: wound check   HPI:  [name] is a 63 y.o. female living with Down's syndrome and Dementia who presents for follow-up on sacral wound. Please see problem based charting for further details.   Past Medical History:  Diagnosis Date  . Allergy   . Depression   . Down's syndrome   . Hallucinations   . Hyperlipidemia   . Hypothyroidism   . Moderate intellectual disability   . Orthostatic hypotension 06/06/2015  . PVD (peripheral vascular disease) ([place])   . Sleep apnea   . Stroke ([place])    in 2006   Review of Systems:   Constitutional: negative for fevers, chills GI: negative for appetite changes, n/v Skin: negative for drainage from the wound Neuro: negative for change in behavior   Physical Exam:  Vitals:   02/03/19 0903  BP: 105/74  Pulse: 87  Temp: (!) 97.5 F (36.4 C)  TempSrc: Oral  SpO2: 97%  Height: 4\' 10"  (1.473 m)   General: alert, pleasantly demented, cooperative  Skin: well-healing wound of intergluteal cleft without fluctuance or drainage. Erythematous scaling patches under bilateral breasts and diffusely in gluteal region    Assessment & Plan:   See Encounters Tab for problem based charting.  Patient discussed with Dr. Evette Doffing

## 2019-02-03 NOTE — Patient Instructions (Signed)
It was was pleasure seeing Tress today. The pressure wound is looking good. Continue caring for it as you've been doing.   I have sent in refills of the nystatin powder, as well as a lamisil topical cream to place on affected areas to help with the ring shaped lesions.   You can plan to follow-up with Dr. Myrtie Hawk as scheduled, but please call if there is concern that the wound is worsening.   Take care, Dr. Koleen Distance

## 2019-02-03 NOTE — Assessment & Plan Note (Signed)
Patient requires urinary and fecal incontinence supplies due to Down's syndrome with advanced Dementia. Fully dependent on caregivers for ADLs.

## 2019-02-03 NOTE — Assessment & Plan Note (Signed)
Refill sent today for nystatin powder.

## 2019-02-03 NOTE — Assessment & Plan Note (Signed)
Caregivers note wound is healing well with barrier cream and twice daily bandage changes. No systemic signs or symptoms of infection. Wound examined today. No fluctuance, increased warmth or drainage. She is wheelchair bound due to advanced dementia. Has seat cushion for chair and caregivers mobilize as rotate her as much as possible.  - continue barrier cream and twice daily bandage change

## 2019-02-04 ENCOUNTER — Ambulatory Visit: Payer: Medicare Other

## 2019-02-08 ENCOUNTER — Telehealth: Payer: Self-pay

## 2019-02-08 NOTE — Telephone Encounter (Signed)
Pt's caregiver requesting to speak with you about getting  Ensure. Please call back.

## 2019-02-09 NOTE — Telephone Encounter (Signed)
Pt's caregiver has no heard anything about the patient getting ensure, pls contact [phone]

## 2019-02-11 NOTE — Telephone Encounter (Signed)
Returned call to caretaker, Cordella Register. She states that medicaid will not cover Ensure and she needs this to supplement patient when she refuses to eat. Discussed contacting Alicia Amel at [facility] [employer] [facility] [employer] [facility]) to see if patient can be enrolled in this program. This is the only program we are aware of that can help patients obtain Ensure for free. [name] will contact Mendel Ryder and let us know if anything else is needed on our end. L. Geetika Laborde, RN, BSN

## 2019-02-13 NOTE — Telephone Encounter (Signed)
This unfortunately did not get brought up during our encounter. Only focused on her wound and skin complaints. Sorry about that!

## 2019-02-18 ENCOUNTER — Encounter: Payer: Medicare Other | Admitting: Internal Medicine

## 2019-02-22 ENCOUNTER — Telehealth: Payer: Self-pay | Admitting: Internal Medicine

## 2019-02-23 ENCOUNTER — Other Ambulatory Visit: Payer: Self-pay

## 2019-02-23 ENCOUNTER — Inpatient Hospital Stay (HOSPITAL_COMMUNITY): Payer: Medicare Other

## 2019-02-23 ENCOUNTER — Inpatient Hospital Stay (HOSPITAL_COMMUNITY)
Admission: EM | Admit: 2019-02-23 | Discharge: 2019-02-26 | DRG: 871 | Disposition: A | Discharge status: Home Health | Payer: Medicare Other | Source: Ambulatory Visit | Attending: Internal Medicine | Admitting: Internal Medicine

## 2019-02-23 ENCOUNTER — Emergency Department (HOSPITAL_COMMUNITY): Payer: Medicare Other

## 2019-02-23 ENCOUNTER — Encounter (HOSPITAL_COMMUNITY): Payer: Self-pay | Admitting: Emergency Medicine

## 2019-02-23 ENCOUNTER — Inpatient Hospital Stay: Admission: AD | Admit: 2019-02-23 | Payer: Medicare Other | Source: Ambulatory Visit | Admitting: Internal Medicine

## 2019-02-23 ENCOUNTER — Ambulatory Visit (INDEPENDENT_AMBULATORY_CARE_PROVIDER_SITE_OTHER): Payer: Medicare Other | Admitting: Internal Medicine

## 2019-02-23 VITALS — BP 105/81 | HR 79 | Ht <= 58 in

## 2019-02-23 DIAGNOSIS — N3000 Acute cystitis without hematuria: Secondary | ICD-10-CM | POA: Diagnosis present

## 2019-02-23 DIAGNOSIS — E785 Hyperlipidemia, unspecified: Secondary | ICD-10-CM | POA: Diagnosis present

## 2019-02-23 DIAGNOSIS — E162 Hypoglycemia, unspecified: Secondary | ICD-10-CM | POA: Diagnosis present

## 2019-02-23 DIAGNOSIS — R680 Hypothermia, not associated with low environmental temperature: Secondary | ICD-10-CM | POA: Diagnosis present

## 2019-02-23 DIAGNOSIS — Z79890 Hormone replacement therapy (postmenopausal): Secondary | ICD-10-CM

## 2019-02-23 DIAGNOSIS — I959 Hypotension, unspecified: Secondary | ICD-10-CM | POA: Diagnosis not present

## 2019-02-23 DIAGNOSIS — Z79899 Other long term (current) drug therapy: Secondary | ICD-10-CM | POA: Diagnosis not present

## 2019-02-23 DIAGNOSIS — Z8673 Personal history of transient ischemic attack (TIA), and cerebral infarction without residual deficits: Secondary | ICD-10-CM | POA: Diagnosis not present

## 2019-02-23 DIAGNOSIS — A419 Sepsis, unspecified organism: Secondary | ICD-10-CM

## 2019-02-23 DIAGNOSIS — Q909 Down syndrome, unspecified: Secondary | ICD-10-CM

## 2019-02-23 DIAGNOSIS — Z7982 Long term (current) use of aspirin: Secondary | ICD-10-CM

## 2019-02-23 DIAGNOSIS — N390 Urinary tract infection, site not specified: Secondary | ICD-10-CM | POA: Diagnosis not present

## 2019-02-23 DIAGNOSIS — I739 Peripheral vascular disease, unspecified: Secondary | ICD-10-CM | POA: Diagnosis present

## 2019-02-23 DIAGNOSIS — Z20828 Contact with and (suspected) exposure to other viral communicable diseases: Secondary | ICD-10-CM | POA: Diagnosis present

## 2019-02-23 DIAGNOSIS — F0280 Dementia in other diseases classified elsewhere without behavioral disturbance: Secondary | ICD-10-CM | POA: Diagnosis present

## 2019-02-23 DIAGNOSIS — R1312 Dysphagia, oropharyngeal phase: Secondary | ICD-10-CM | POA: Diagnosis present

## 2019-02-23 DIAGNOSIS — J189 Pneumonia, unspecified organism: Secondary | ICD-10-CM | POA: Diagnosis present

## 2019-02-23 DIAGNOSIS — E039 Hypothyroidism, unspecified: Secondary | ICD-10-CM | POA: Diagnosis present

## 2019-02-23 DIAGNOSIS — F0281 Dementia in other diseases classified elsewhere with behavioral disturbance: Secondary | ICD-10-CM | POA: Diagnosis present

## 2019-02-23 DIAGNOSIS — Z800 Family history of malignant neoplasm of digestive organs: Secondary | ICD-10-CM

## 2019-02-23 DIAGNOSIS — R1310 Dysphagia, unspecified: Secondary | ICD-10-CM | POA: Diagnosis not present

## 2019-02-23 DIAGNOSIS — R5383 Other fatigue: Secondary | ICD-10-CM | POA: Diagnosis present

## 2019-02-23 DIAGNOSIS — R0981 Nasal congestion: Secondary | ICD-10-CM | POA: Diagnosis not present

## 2019-02-23 DIAGNOSIS — G9341 Metabolic encephalopathy: Secondary | ICD-10-CM | POA: Diagnosis present

## 2019-02-23 DIAGNOSIS — B9620 Unspecified Escherichia coli [E. coli] as the cause of diseases classified elsewhere: Secondary | ICD-10-CM | POA: Diagnosis not present

## 2019-02-23 DIAGNOSIS — F02818 Dementia in other diseases classified elsewhere, unspecified severity, with other behavioral disturbance: Secondary | ICD-10-CM | POA: Diagnosis present

## 2019-02-23 DIAGNOSIS — G9340 Encephalopathy, unspecified: Secondary | ICD-10-CM | POA: Diagnosis not present

## 2019-02-23 DIAGNOSIS — G4730 Sleep apnea, unspecified: Secondary | ICD-10-CM | POA: Diagnosis present

## 2019-02-23 DIAGNOSIS — F0391 Unspecified dementia with behavioral disturbance: Secondary | ICD-10-CM | POA: Diagnosis not present

## 2019-02-23 HISTORY — DX: Unspecified dementia, unspecified severity, without behavioral disturbance, psychotic disturbance, mood disturbance, and anxiety: F03.90

## 2019-02-23 LAB — COMPREHENSIVE METABOLIC PANEL
ALT: 10 U/L (ref 0–44)
AST: 17 U/L (ref 15–41)
Albumin: 2.7 g/dL — ABNORMAL LOW (ref 3.5–5.0)
Alkaline Phosphatase: 50 U/L (ref 38–126)
Anion gap: 14 (ref 5–15)
BUN: 10 mg/dL (ref 8–23)
CO2: 21 mmol/L — ABNORMAL LOW (ref 22–32)
Calcium: 8.5 mg/dL — ABNORMAL LOW (ref 8.9–10.3)
Chloride: 108 mmol/L (ref 98–111)
Creatinine, Ser: 0.84 mg/dL (ref 0.44–1.00)
GFR calc Af Amer: >60 mL/min (ref >60)
GFR calc non Af Amer: >60 mL/min (ref >60)
Glucose, Bld: 73 mg/dL (ref 70–99)
Potassium: 3.8 mmol/L (ref 3.5–5.1)
Sodium: 143 mmol/L (ref 135–145)
Total Bilirubin: 1 mg/dL (ref 0.3–1.2)
Total Protein: 6.1 g/dL — ABNORMAL LOW (ref 6.5–8.1)

## 2019-02-23 LAB — URINALYSIS, ROUTINE W REFLEX MICROSCOPIC
Bilirubin Urine: NEGATIVE
Glucose, UA: NEGATIVE mg/dL
Hgb urine dipstick: NEGATIVE
Ketones, ur: 80 mg/dL — AB
Nitrite: POSITIVE — AB
Protein, ur: 30 mg/dL — AB
Specific Gravity, Urine: 1.02 (ref 1.005–1.030)
pH: 5 (ref 5.0–8.0)

## 2019-02-23 LAB — LACTIC ACID, PLASMA: Lactic Acid, Venous: 1.5 mmol/L (ref 0.5–1.9)

## 2019-02-23 LAB — POCT I-STAT 7, (LYTES, BLD GAS, ICA,H+H)
Acid-base deficit: 1 mmol/L (ref 0.0–2.0)
Bicarbonate: 22.7 mmol/L (ref 20.0–28.0)
Calcium, Ion: 1.17 mmol/L (ref 1.15–1.40)
HCT: 36 % (ref 36.0–46.0)
Hemoglobin: 12.2 g/dL (ref 12.0–15.0)
O2 Saturation: 97 %
Patient temperature: 98.6
Potassium: 3.3 mmol/L — ABNORMAL LOW (ref 3.5–5.1)
Sodium: 143 mmol/L (ref 135–145)
TCO2: 24 mmol/L (ref 22–32)
pCO2 arterial: 33.9 mmHg (ref 32.0–48.0)
pH, Arterial: 7.435 (ref 7.350–7.450)
pO2, Arterial: 86 mmHg (ref 83.0–108.0)

## 2019-02-23 LAB — CBC WITH DIFFERENTIAL/PLATELET
Abs Immature Granulocytes: 0.22 10*3/uL — ABNORMAL HIGH (ref 0.00–0.07)
Basophils Absolute: 0.1 10*3/uL (ref 0.0–0.1)
Basophils Relative: 1 %
Eosinophils Absolute: 0 10*3/uL (ref 0.0–0.5)
Eosinophils Relative: 1 %
HCT: 45.7 % (ref 36.0–46.0)
Hemoglobin: 14.2 g/dL (ref 12.0–15.0)
Immature Granulocytes: 4 %
Lymphocytes Relative: 15 %
Lymphs Abs: 0.8 10*3/uL (ref 0.7–4.0)
MCH: 30.9 pg (ref 26.0–34.0)
MCHC: 31.1 g/dL (ref 30.0–36.0)
MCV: 99.3 fL (ref 80.0–100.0)
Monocytes Absolute: 0.4 10*3/uL (ref 0.1–1.0)
Monocytes Relative: 7 %
Neutro Abs: 4 10*3/uL (ref 1.7–7.7)
Neutrophils Relative %: 72 %
Platelets: 153 10*3/uL (ref 150–400)
RBC: 4.6 MIL/uL (ref 3.87–5.11)
RDW: 15.4 % (ref 11.5–15.5)
WBC: 5.5 10*3/uL (ref 4.0–10.5)
nRBC: 0 % (ref 0.0–0.2)

## 2019-02-23 LAB — CBG MONITORING, ED: Glucose-Capillary: 69 mg/dL — ABNORMAL LOW (ref 70–99)

## 2019-02-23 LAB — RAPID URINE DRUG SCREEN, HOSP PERFORMED
Amphetamines: NOT DETECTED
Barbiturates: NOT DETECTED
Benzodiazepines: POSITIVE — AB
Cocaine: NOT DETECTED
Opiates: NOT DETECTED
Tetrahydrocannabinol: NOT DETECTED

## 2019-02-23 LAB — MAGNESIUM: Magnesium: 2.1 mg/dL (ref 1.7–2.4)

## 2019-02-23 LAB — APTT: aPTT: 26 seconds (ref 24–36)

## 2019-02-23 LAB — SALICYLATE LEVEL: Salicylate Lvl: <7 mg/dL (ref 2.8–30.0)

## 2019-02-23 LAB — PROTIME-INR
INR: 1.1 (ref 0.8–1.2)
Prothrombin Time: 14 seconds (ref 11.4–15.2)

## 2019-02-23 LAB — PHOSPHORUS: Phosphorus: 4.3 mg/dL (ref 2.5–4.6)

## 2019-02-23 LAB — SARS CORONAVIRUS 2 BY RT PCR (HOSPITAL ORDER, PERFORMED IN ~~LOC~~ HOSPITAL LAB): SARS Coronavirus 2: NEGATIVE

## 2019-02-23 LAB — TSH: TSH: 0.586 u[IU]/mL (ref 0.350–4.500)

## 2019-02-23 MED ORDER — DEXTROSE 50 % IV SOLN
INTRAVENOUS | Status: AC
  Administered 2019-02-23: 20:00:00 50 mL via INTRAVENOUS
  Filled 2019-02-23: qty 50

## 2019-02-23 MED ORDER — ACETAMINOPHEN 325 MG PO TABS
650.0000 mg | ORAL_TABLET | Freq: Four times a day (QID) | ORAL | Status: DC | PRN
  Administered 2019-02-25: 650 mg via ORAL
  Filled 2019-02-23: qty 2

## 2019-02-23 MED ORDER — ACETAMINOPHEN 650 MG RE SUPP: 650.0000 mg | Freq: Four times a day (QID) | RECTAL | Status: DC | PRN

## 2019-02-23 MED ORDER — DEXTROSE-NACL 5-0.45 % IV SOLN
INTRAVENOUS | Status: DC
  Administered 2019-02-23 – 2019-02-26 (×5): via INTRAVENOUS

## 2019-02-23 MED ORDER — DIVALPROEX SODIUM ER 250 MG PO TB24: 250.0000 mg | ORAL_TABLET | Freq: Three times a day (TID) | ORAL | Status: DC

## 2019-02-23 MED ORDER — DEXTROSE 50 % IV SOLN: 25.0000 mL | Freq: Once | INTRAVENOUS | Status: DC

## 2019-02-23 MED ORDER — DEXTROSE 50 % IV SOLN
1.0000 | Freq: Once | INTRAVENOUS | Status: AC
  Administered 2019-02-23: 20:00:00 50 mL via INTRAVENOUS

## 2019-02-23 MED ORDER — ENOXAPARIN SODIUM 40 MG/0.4ML ~~LOC~~ SOLN
40.0000 mg | SUBCUTANEOUS | Status: DC
  Administered 2019-02-24 – 2019-02-26 (×3): 40 mg via SUBCUTANEOUS
  Filled 2019-02-23 (×4): qty 0.4

## 2019-02-23 MED ORDER — LEVOTHYROXINE SODIUM 50 MCG PO TABS
62.5000 ug | ORAL_TABLET | Freq: Every day | ORAL | Status: DC
  Administered 2019-02-26: 62.5 ug via ORAL
  Filled 2019-02-23: qty 1

## 2019-02-23 MED ORDER — SODIUM CHLORIDE 0.9 % IV BOLUS
1000.0000 mL | Freq: Once | INTRAVENOUS | Status: AC
  Administered 2019-02-23: 18:00:00 1000 mL via INTRAVENOUS

## 2019-02-23 MED ORDER — ENSURE ENLIVE PO LIQD
1.0000 | Freq: Two times a day (BID) | ORAL | Status: DC
  Filled 2019-02-23: qty 237

## 2019-02-23 MED ORDER — IOHEXOL 300 MG/ML  SOLN
75.0000 mL | Freq: Once | INTRAMUSCULAR | Status: AC | PRN
  Administered 2019-02-23: 23:00:00 75 mL via INTRAVENOUS

## 2019-02-23 MED ORDER — SODIUM CHLORIDE 0.9 % IV SOLN
2.0000 g | INTRAVENOUS | Status: DC
  Administered 2019-02-23 – 2019-02-25 (×3): 2 g via INTRAVENOUS
  Filled 2019-02-23: qty 2
  Filled 2019-02-23 (×2): qty 20
  Filled 2019-02-23: qty 2

## 2019-02-23 MED ORDER — SODIUM CHLORIDE 0.9 % IV SOLN
500.0000 mg | INTRAVENOUS | Status: DC
  Administered 2019-02-23 – 2019-02-25 (×3): 500 mg via INTRAVENOUS
  Filled 2019-02-23 (×4): qty 500

## 2019-02-23 NOTE — ED Notes (Signed)
Per pt's caregiver, pt will fake a seizure and makes her body whole body tremble, but if you ask her if she is having a seizure she will answer yes. The caregiver her stated she will tell the pt to tell her when she is done. The caregiver states she is good with hiding her pills in her mouth instead of swallowing them.

## 2019-02-23 NOTE — Progress Notes (Signed)
   CC: lethargy  HPI:  [name] is a 63 y.o. female with PMH below.  Today we will address lethargy  Please see A&P for status of the patient's chronic medical conditions  Past Medical History:  Diagnosis Date  . Allergy   . Depression   . Down's syndrome   . Hallucinations   . Hyperlipidemia   . Hypothyroidism   . Moderate intellectual disability   . Orthostatic hypotension 06/06/2015  . PVD (peripheral vascular disease) ([place])   . Sleep apnea   . Stroke [facility])    in 2006   Review of Systems:    ROS limited as pt lethargic  Physical Exam:  Vitals:   02/23/19 1451  BP: 105/81  Pulse: 79  SpO2: 98%  Height: 4\' 10"  (1.473 m)   Cardiac:  normal rate and rhythm, clear s1 and s2, no murmurs, rubs or gallops, no LE edema, cool distal exremities Pulmonary: shallow breaths and distant breath sounds, not in distress Abdominal: non distended abdomen, soft and nontender Psych: Alert, conversant, in good spirits   Social History   Socioeconomic History  . Marital status: Single    Spouse name: Not on file  . Number of children: Not on file  . Years of education: Not on file  . Highest education level: Not on file  Occupational History  . Not on file  Social Needs  . Financial resource strain: Not on file  . Food insecurity    Worry: Not on file    Inability: Not on file  . Transportation needs    Medical: Not on file    Non-medical: Not on file  Tobacco Use  . Smoking status: Never Smoker  . Smokeless tobacco: Never Used  Substance and Sexual Activity  . Alcohol use: No    Alcohol/week: 0.0 standard drinks  . Drug use: No  . Sexual activity: Never  Lifestyle  . Physical activity    Days per week: Not on file    Minutes per session: Not on file  . Stress: Not on file  Relationships  . Social Herbalist on phone: Not on file    Gets together: Not on file    Attends religious service: Not on file    Active member of club or  organization: Not on file    Attends meetings of clubs or organizations: Not on file    Relationship status: Not on file  . Intimate partner violence    Fear of current or ex partner: Not on file    Emotionally abused: Not on file    Physically abused: Not on file    Forced sexual activity: Not on file  Other Topics Concern  . Not on file  Social History Narrative  . Not on file    Family History  Problem Relation Age of Onset  . Colon cancer Paternal Uncle   . Colon cancer Maternal Grandfather     Assessment & Plan:   See Encounters Tab for problem based charting.  Patient discussed with Dr. Lynnae January

## 2019-02-23 NOTE — ED Notes (Signed)
Placed pt on 2L 02 per [place]. 

## 2019-02-23 NOTE — ED Triage Notes (Signed)
Pt here from clinic with AMS x1 week. Congestion in chest. Denies fevers at home. Denies pain. Decreased appetite. Pt non ambulatory.

## 2019-02-23 NOTE — ED Provider Notes (Signed)
[facility] EMERGENCY DEPARTMENT Provider Note   CSN: [id number] Arrival date & time: 02/23/19  1710     History   Chief Complaint Chief Complaint  Patient presents with  . Altered Mental Status   Level 5 caveat due to altered mental status  HPI Amanda Castillo is a 63 y.o. female with history of dementia, Down syndrome, hyperlipidemia, hypothyroidism, PVD, CVA brought in by sister and caregiver presents sent from internal medicine clinic for evaluation of acute onset, progressively worsening cough, altered mental status, lethargy.  Caregiver reports that she has had a "junky sounding "cough for approximately 1 week.  Intermittently productive of yellow sputum which today is now tan/brown.  Caregiver notes the patient has not appeared to be particularly short of breath and has not been complaining of abdominal pain or chest pain.  No vomiting or fevers at home.  She does note that she has had significantly decreased appetite and the last time she ate was a few sips of chicken noodle soup on Saturday.  She does note that the patient appears to not want to swallow her foods.  2 weeks ago she had increased agitation and was yelling a lot, put on Depakote by psychiatry and her Ativan was also increased.  Her caregiver notes that she was somewhat more lethargic and sleepy since the medication changes and has been sleeping more but really has been altered for the last 3 or 4 days.  Found to be hypothermic in the ED.  On my assessment she is minimally responsive to painful stimuli, pupils pinpoint bilaterally.  Does not follow commands, no meaningful verbal response.  Caregiver notes that she is minimally conversational at baseline due to her worsening dementia but this is worse than her usual mental status baseline.     The history is provided by a caregiver. The history is limited by the condition of the patient and a developmental delay.    Past Medical History:  Diagnosis  Date  . Allergy   . Dementia ([place])   . Depression   . Down's syndrome   . Hallucinations   . Hyperlipidemia   . Hypothyroidism   . Moderate intellectual disability   . Orthostatic hypotension 06/06/2015  . PVD (peripheral vascular disease) ([place])   . Sleep apnea   . Stroke [facility]    in 2006    Patient Active Problem List   Diagnosis Date Noted  . Bed sore 01/24/2019  . History of ongoing treatment with high-risk medication 01/24/2019  . Osteopenia 06/23/2017  . Incontinence of urine in female 06/23/2017  . Vitamin D deficiency 03/19/2017  . At high risk for falls 03/19/2017  . Candidal intertrigo 08/28/2016  . Allergic rhinitis 02/28/2016  . Health care maintenance 07/27/2015  . Dementia associated with other underlying disease with behavioral disturbance ([place]) 05/18/2015  . Down syndrome 03/24/2014  . Hyperlipidemia 03/24/2014  . Hypothyroidism 03/24/2014  . Sleep apnea 03/24/2014    Past Surgical History:  Procedure Laterality Date  . COLONOSCOPY  06/12/2012  . orif lt foot    . TONSILLECTOMY       OB History   No obstetric history on file.      Home Medications    Prior to Admission medications   Medication Sig Start Date End Date Taking? Authorizing Provider  ASPIRIN LOW DOSE 81 MG EC tablet TAKE (1) TABLET BY MOUTH ONCE DAILY. 09/11/18   Masoudi, Dorthula Rue, MD  Calcium Carbonate-Vit D-Min (CALCIUM 1200) 1200-1000 MG-UNIT CHEW Chew  1 tablet by mouth daily. 04/15/18   Masoudi, Elhamalsadat, MD  CALTRATE GUMMY BITES 250-400 MG-UNIT CHEW CHEW 1 GUMMY ONCE DAILY. 12/08/18   Masoudi, Elhamalsadat, MD  docusate sodium (COLACE) 100 MG capsule Take 1 capsule (100 mg total) by mouth 2 (two) times daily. 04/15/18   Masoudi, Dorthula Rue, MD  Doxepin HCl (SILENOR) 6 MG TABS Take 1 tablet (6 mg total) by mouth at bedtime. 04/15/18   Masoudi, Elhamalsadat, MD  feeding supplement, ENSURE ENLIVE, (ENSURE ENLIVE) LIQD Take 237 mLs by mouth 3 (three) times daily between meals.  12/14/18   Masoudi, Elhamalsadat, MD  hydrocortisone cream 1 % Apply to affected area at toes 2 times daily. Discontinue when redness resolved. 04/15/18 04/15/19  Masoudi, Dorthula Rue, MD  Iloperidone (FANAPT) 6 MG TABS Take 1 tablet (6 mg total) by mouth at bedtime. 04/15/18   Masoudi, Dorthula Rue, MD  levETIRAcetam (KEPPRA) 500 MG tablet Take 1 tablet (500 mg total) by mouth 2 (two) times daily. 06/01/18   Lorella Nimrod, MD  levothyroxine (SYNTHROID, LEVOTHROID) 125 MCG tablet Take 0.5 tablets (62.5 mcg total) by mouth daily before breakfast. 04/15/18   Masoudi, Dorthula Rue, MD  loratadine (CLARITIN) 10 MG tablet Take 1 tablet (10 mg total) by mouth daily. 04/15/18   Masoudi, Elhamalsadat, MD  nystatin (NYSTATIN) powder Apply topically 4 (four) times daily. Apply on the affected area under the breast. Can discontinue when clears. 02/03/19   Bloomfield, Carley D, DO  Propylene Glycol (SYSTANE BALANCE OP) Apply 1 drop to eye 2 (two) times daily as needed.    [provider]  simvastatin (ZOCOR) 40 MG tablet Take 1 tablet (40 mg total) by mouth at bedtime. At bedtime 04/15/18   Masoudi, Dorthula Rue, MD  sodium chloride (OCEAN) 0.65 % SOLN nasal spray Place 1 spray into both nostrils at bedtime as needed for congestion.     [provider]  terbinafine (LAMISIL) 1 % cream Apply 1 application topically 2 (two) times daily as needed. 02/03/19   Modena Nunnery D, DO    Family History Family History  Problem Relation Age of Onset  . Colon cancer Paternal Uncle   . Colon cancer Maternal Grandfather     Social History Social History   Tobacco Use  . Smoking status: Never Smoker  . Smokeless tobacco: Never Used  Substance Use Topics  . Alcohol use: No    Alcohol/week: 0.0 standard drinks  . Drug use: No     Allergies   Patient has no known allergies.   Review of Systems Review of Systems  Unable to perform ROS: Mental status change     Physical Exam Updated Vital  Signs BP 132/64   Pulse 72   Temp (!) 95.9 F (35.5 C) (Rectal)   Resp 19   Ht 4\' 10"  (1.473 m)   Wt 68 kg   SpO2 94%   BMI 31.35 kg/m   Physical Exam Vitals signs and nursing note reviewed.  Constitutional:      General: She is not in acute distress.    Appearance: She is well-developed.  HENT:     Head: Normocephalic and atraumatic.  Eyes:     General:        Right eye: No discharge.        Left eye: No discharge.     Conjunctiva/sclera: Conjunctivae normal.  Neck:     Vascular: No JVD.     Trachea: No tracheal deviation.  Cardiovascular:     Rate and Rhythm: Normal rate and  regular rhythm.     Comments: 1+ radial and DP/PT pulses bilaterally.  Extremities are somewhat cool and mottled Pulmonary:     Effort: Pulmonary effort is normal.  Abdominal:     General: There is no distension.  Skin:    General: Skin is warm and dry.     Findings: No erythema.  Neurological:     Mental Status: She is alert.     GCS: GCS eye subscore is 3. GCS verbal subscore is 3. GCS motor subscore is 5.     Comments: Minimally responsive to painful stimuli, does not answer questions, will not follow most commands.  Psychiatric:        Behavior: Behavior normal.      ED Treatments / Results  Labs (all labs ordered are listed, but only abnormal results are displayed) Labs Reviewed  COMPREHENSIVE METABOLIC PANEL - Abnormal; Notable for the following components:      Result Value   CO2 21 (*)    Calcium 8.5 (*)    Total Protein 6.1 (*)    Albumin 2.7 (*)    All other components within normal limits  CBC WITH DIFFERENTIAL/PLATELET - Abnormal; Notable for the following components:   Abs Immature Granulocytes 0.22 (*)    All other components within normal limits  URINALYSIS, ROUTINE W REFLEX MICROSCOPIC - Abnormal; Notable for the following components:   Ketones, ur 80 (*)    Protein, ur 30 (*)    Nitrite POSITIVE (*)    Leukocytes,Ua MODERATE (*)    Bacteria, UA MANY (*)    All  other components within normal limits  CBG MONITORING, ED - Abnormal; Notable for the following components:   Glucose-Capillary 69 (*)    All other components within normal limits  SARS CORONAVIRUS 2 (HOSPITAL ORDER, [facility])  CULTURE, BLOOD (ROUTINE X 2)  CULTURE, BLOOD (ROUTINE X 2)  URINE CULTURE  LACTIC ACID, PLASMA  APTT  PROTIME-INR  LACTIC ACID, PLASMA    EKG None  Radiology Ct Head Wo Contrast  Result Date: 02/23/2019 CLINICAL DATA:  Altered level of consciousness EXAM: CT HEAD WITHOUT CONTRAST TECHNIQUE: Contiguous axial images were obtained from the base of the skull through the vertex without intravenous contrast. COMPARISON:  CT head 05/23/2018 FINDINGS: Brain: Extensive region of left frontal gliosis is similar to prior study. Findings on a background of patchy white matter hypoattenuation most compatible with chronic microvascular angiopathy. Stable symmetric prominence of the ventricles, cisterns and sulci compatible with parenchymal volume loss. No evidence of acute infarction, hemorrhage, hydrocephalus, extra-axial collection or mass lesion/mass effect. Vascular: No unexpected hyperdense vessel. Skull: No calvarial fracture or suspicious osseous lesion. No scalp swelling or hematoma. Sinuses/Orbits: Paranasal sinuses and mastoid air cells are predominantly clear. Hypo pneumatization of the right mastoid air cells. Included orbital structures are unremarkable. Other: None IMPRESSION: No acute intracranial abnormality. Stable left frontal gliosis and parenchymal volume loss. Electronically Signed   By: Lovena Le M.D.   On: 02/23/2019 20:24   Dg Chest Port 1 View  Result Date: 02/23/2019 CLINICAL DATA:  63 year old presenting with cough, chest congestion and shortness breath. EXAM: PORTABLE CHEST 1 VIEW COMPARISON:  01/07/2018, 01/16/2015. FINDINGS: Suboptimal inspiration accounts for crowded bronchovascular markings diffusely and atelectasis in  the bases, RIGHT greater than LEFT, and accentuates the cardiac silhouette. Taking this into account, cardiomediastinal silhouette unremarkable and unchanged. Lungs otherwise clear. Normal pulmonary vascularity. No visible pleural effusions. IMPRESSION: Suboptimal inspiration accounts for bibasilar atelectasis, RIGHT  greater than LEFT. No acute cardiopulmonary disease otherwise. Electronically Signed   By: Evangeline Dakin M.D.   On: 02/23/2019 18:39    Procedures .Critical Care Performed by: Renita Papa, PA-C Authorized by: Renita Papa, PA-C   Critical care provider statement:    Critical care time (minutes):  40   Critical care was necessary to treat or prevent imminent or life-threatening deterioration of the following conditions:  Sepsis   Critical care was time spent personally by me on the following activities:  Discussions with consultants, evaluation of patient's response to treatment, examination of patient, ordering and performing treatments and interventions, ordering and review of laboratory studies, ordering and review of radiographic studies, pulse oximetry, re-evaluation of patient's condition, obtaining history from patient or surrogate and review of old charts   (including critical care time)  Medications Ordered in ED Medications  cefTRIAXone (ROCEPHIN) 2 g in sodium chloride 0.9 % 100 mL IVPB (0 g Intravenous Stopped 02/23/19 2002)  azithromycin (ZITHROMAX) 500 mg in sodium chloride 0.9 % 250 mL IVPB (0 mg Intravenous Stopped 02/23/19 2020)  sodium chloride 0.9 % bolus 1,000 mL (0 mLs Intravenous Stopped 02/23/19 2020)  dextrose 50 % solution 50 mL (50 mLs Intravenous Given 02/23/19 2016)     Initial Impression / Assessment and Plan / ED Course  I have reviewed the triage vital signs and the nursing notes.  Pertinent labs & imaging results that were available during my care of the patient were reviewed by me and considered in my medical decision making (see chart for  details).         SAMAYA BYL was evaluated in Emergency Department on 02/23/2019 for the symptoms described in the history of present illness. She was evaluated in the context of the global COVID-19 pandemic, which necessitated consideration that the patient might be at risk for infection with the SARS-CoV-2 virus that causes COVID-19. Institutional protocols and algorithms that pertain to the evaluation of patients at risk for COVID-19 are in a state of rapid change based on information released by regulatory bodies including the CDC and federal and state organizations. These policies and algorithms were followed during the patient's care in the ED.  Patient presents sent from internal medicine clinic for evaluation of altered mental status, increased lethargy, decreased oral intake, and productive cough for 1 week.  She is hypothermic with a rectal temp of 96.4 F, vital signs otherwise stable.  Minimally responsive to pain.  No focal neurologic deficits.  Code sepsis was initiated, blood cultures were obtained and she was given broad-spectrum antibiotics (Rocephin and azithromycin) for suspected respiratory infection.  She did not receive a 20 cc/kg bolus due to well-maintained blood pressure.  Point-of-care CBG was mildly low at 69 so she was given an amp of D50.   Lab work today reviewed by me shows no leukocytosis, no anemia, no metabolic derangements.  No renal insufficiency.  UA concerning for UTI, will culture.  X-ray shows no acute cardiopulmonary abnormalities and COVID test is negative.  Head CT shows no acute intracranial abnormalities.   Spoke with Dr. Sharon Seller with internal medicine teaching service who agrees to assume care of patient and bring her into the hospital for further evaluation and management.  Final Clinical Impressions(s) / ED Diagnoses   Final diagnoses:  Sepsis without acute organ dysfunction, due to unspecified organism [facility])    ED Discharge Orders    None        Renita Papa, PA-C 02/23/19  2118    Maudie Flakes, MD 02/23/19 [phone]

## 2019-02-23 NOTE — ED Notes (Signed)
Rn took [facility] off pt. Rectal temp 99.3

## 2019-02-23 NOTE — ED Notes (Signed)
Patient transported to CT 

## 2019-02-23 NOTE — ED Notes (Signed)
Pt is on [facility]

## 2019-02-23 NOTE — H&P (Signed)
Date: 02/24/2019               Patient Name:  Amanda Castillo MRN: [id number]  DOB: March 20, 1956 Age / Sex: 63 y.o., female   PCP: Dewayne Hatch, MD         Medical Service: Internal Medicine Teaching Service         Attending Physician: Dr. Rebeca Alert, Raynaldo Opitz, MD    First Contact: Dr. Court Joy Pager: [phone]  Second Contact: Dr. Sherry Ruffing Pager: [phone]       After Hours (After 5p/  First Contact Pager: [phone]  weekends / holidays): Second Contact Pager: [phone]   Chief Complaint: Altered mental status   History of Present Illness:  Amanda Castillo is a 64 year old female with a pertinent past medical history of Down syndrome, hyperlipidemia, PVD, stroke, and sleep apnea and presented to the internal medicine teaching program clinic earlier today with lethargy that was associated with cough, anorexia, and nasal congestion. The patient lives with her caregiver and she provided the history today. The patient's sister is her healthcare POA. Two weeks ago the patient was experiencing increase agitation.  Her psychiatrist added Depakote and increased her Ativan at this time.  The patient subsequently became more sleepy over the next week and developed a productive cough of yellow mucus that has progressed to dark brown and sneezing frequently.  She had progressively decreased oral intake.  Over the last week, she was able to tolerate spoonfuls of Pedialyte and broth but has had very little solid food. Her last oral intake was on Saturday which consisted of spoonfuls of chicken noodle soup.  During this time she began having increased choking after eating with drooling.  She has had increased lethargy and sleepiness over the past weeks which is abnormal for her.  Her caretaker states that she often does not sleep through the night and can become agitated, consisting of grinding her teeth and yelling periodically at night. She is now minimally responsive to stimuli, sleeping throughout the  day and night, and does not currently follow commands.  Patient's baseline consist of sitting in her wheelchair, answering questions with periodic talking over the past several months, following commands, and staying awake throughout the day.  The caregiver believes that the patient's dementia has worsened over the last several months stating that the patient no longer remembers her name.  She has a history of incontinence but has had decreased urine output that the caregiver believes is secondary to decreased oral intake.  No change in bowel movements.  The caregiver denies any indication of abdominal pain.  The patient has a history of a sacral ulcer that is well-healed at this time.  She has another wound on her bra line that she states is healing and only minimally erythematous.  Patient has a history of fungal infections on her arms and back that the caregiver states is clearing up at this time. Denies sick contacts or obvious signs of pain with urination. Admits to lethargy, change in mental status, anorexia, cough, and nasal congestion.   Social:  Social History   Socioeconomic History   Marital status: Single    Spouse name: Not on file   Number of children: Not on file   Years of education: Not on file   Highest education level: Not on file  Occupational History   Not on file  Social Needs   Financial resource strain: Not on file   Food insecurity    Worry: Not on  file    Inability: Not on file   Transportation needs    Medical: Not on file    Non-medical: Not on file  Tobacco Use   Smoking status: Never Smoker   Smokeless tobacco: Never Used  Substance and Sexual Activity   Alcohol use: No    Alcohol/week: 0.0 standard drinks   Drug use: No   Sexual activity: Never  Lifestyle   Physical activity    Days per week: Not on file    Minutes per session: Not on file   Stress: Not on file  Relationships   Social connections    Talks on phone: Not on file     Gets together: Not on file    Attends religious service: Not on file    Active member of club or organization: Not on file    Attends meetings of clubs or organizations: Not on file    Relationship status: Not on file   Intimate partner violence    Fear of current or ex partner: Not on file    Emotionally abused: Not on file    Physically abused: Not on file    Forced sexual activity: Not on file  Other Topics Concern   Not on file  Social History Narrative   Not on file     Family History:  Family History  Problem Relation Age of Onset   Colon cancer Paternal Uncle    Colon cancer Maternal Grandfather      Meds:  Current Meds  Medication Sig   ASPIRIN LOW DOSE 81 MG EC tablet TAKE (1) TABLET BY MOUTH ONCE DAILY. (Patient taking differently: Take 81 mg by mouth daily. )   Calcium Carbonate-Vit D-Min (CALCIUM 1200) 1200-1000 MG-UNIT CHEW Chew 1 tablet by mouth daily.   CALTRATE GUMMY BITES 250-400 MG-UNIT CHEW CHEW 1 GUMMY ONCE DAILY. (Patient taking differently: Chew 1 tablet by mouth daily. )   divalproex (DEPAKOTE ER) 250 MG 24 hr tablet Take 250 mg by mouth 3 (three) times daily.   docusate sodium (COLACE) 100 MG capsule Take 1 capsule (100 mg total) by mouth 2 (two) times daily.   FANAPT 8 MG TABS Take 8 mg by mouth at bedtime.    hydrocortisone cream 1 % Apply to affected area at toes 2 times daily. Discontinue when redness resolved. (Patient taking differently: Apply 1 application topically 2 (two) times daily as needed for itching (or redness (to affected toes) and stop when resolved). )   levETIRAcetam (KEPPRA) 500 MG tablet Take 1 tablet (500 mg total) by mouth 2 (two) times daily.   levothyroxine (SYNTHROID, LEVOTHROID) 125 MCG tablet Take 0.5 tablets (62.5 mcg total) by mouth daily before breakfast.   loratadine (CLARITIN) 10 MG tablet Take 1 tablet (10 mg total) by mouth daily.   LORazepam (ATIVAN) 1 MG tablet Take 2 mg by mouth daily at 8 pm.    simvastatin (ZOCOR) 40 MG tablet Take 1 tablet (40 mg total) by mouth at bedtime. At bedtime   sodium chloride (OCEAN) 0.65 % SOLN nasal spray Place 1 spray into both nostrils at bedtime as needed for congestion.    terbinafine (LAMISIL) 1 % cream Apply 1 application topically 2 (two) times daily as needed. (Patient taking differently: Apply 1 application topically 2 (two) times daily as needed (for irritation). )     Allergies: Allergies as of 02/23/2019   (No Known Allergies)   Past Medical History:  Diagnosis Date   Allergy    Dementia (  [place])    Depression    Down's syndrome    Hallucinations    Hyperlipidemia    Hypothyroidism    Moderate intellectual disability    Orthostatic hypotension 06/06/2015   PVD (peripheral vascular disease) ([place])    Sleep apnea    Stroke ([place])    in 2006     Review of Systems: A complete ROS due to patient's change in mental status.  Physical Exam: Blood pressure 102/73, pulse 91, temperature 99.3 F (37.4 C), temperature source Rectal, resp. rate 20, height 4\' 10"  (1.473 m), weight 68 kg, SpO2 96 %. Physical Exam  Constitutional: She appears lethargic. She has a sickly appearance.  Patient was not alert or oriented.  Minimally responsive to stimuli.  Could not follow commands at this time.  HENT:  Head: Atraumatic.  Nose: Mucosal edema and rhinorrhea present.  Mouth/Throat: No oral lesions. No lacerations (was not able to visualize the entire oral cavity). Oropharyngeal exudate (patient appears to be coughing up rust colored sputum likely blood.) present. No posterior oropharyngeal edema or posterior oropharyngeal erythema.  Eyes: Right eye exhibits exudate. Right pupil is not round. Left pupil is not round.  Cardiovascular: Normal rate, regular rhythm, intact distal pulses and normal pulses.  Pulmonary/Chest: No accessory muscle usage. No respiratory distress. She has rales in the right upper field and the right middle field.    Abdominal: Soft. She exhibits no distension. There is no abdominal tenderness.  Neurological: She appears lethargic.  Skin: Petechiae (right LE) noted.    EKG: personally reviewed my interpretation is sinus rhythm possible LVH  CXR: personally reviewed my interpretation is Suboptimal inspiration accounts for bibasilar atelectasis, RIGHT greater than LEFT. No acute cardiopulmonary disease otherwise  CT chest: Small bilateral pleural effusions with associated atelectatic changes. Bilateral interstitial prominence which may be related to underlying edema although atypical pneumonia deserves consideration as well  CT head: No acute intracranial abnormality. Stable left frontal gliosis and parenchymal volume loss.  Assessment & Plan by Problem: Active Problems:   Down syndrome   Hypothyroidism   Dementia associated with other underlying disease with behavioral disturbance (HCC)   Altered mental state   Acute metabolic encephalopathy   Acute cystitis   Atypical pneumonia   1. Metabolic Encephalopathy - Possibly due to medications. UDS is positive for benzodiazepines as expected and Valproic acid is 52 (WNL). Salicylate [id number]. Patient is likely not been eating or is malnourished evident by the presence of ketones in her urine and low albumin at 2.7. No history of DM. No documented weight lose in the last 6 months. Patient given thiamine and folic acid supplementations. She has a history of stroke and has an MRI pending. TSH was WNL. Possibly due to sepsis (Atypical pneumonia vs UTI). - Supplement thiamine and folic acid - Dextrose given   2. Atypical Pneumonia - Given the patients history of increased dosage of sedating medications, difficulty eating solid foods without chocking, and increased cough with trust-colored sputum, the patient likely has atypical pneumonia or possibly aspiration pneumonia. CT chest could not r/o pneumonia. She has been hypothermic ~ 96.62F, hypotensive down to 87/42,  and periodically tachypnic ~21 resp and tachycardic up to 117 bpm.  Furthermore, she desaturated in the 80's on RA. Now on 2 L of O2 [place]. WBC 5.8, Lactic acid 1.5, and procalcitonin <0.10. - Empiric treatment with Azithromycin 500 mg q24 hours, Ceftriaxone 2 g q24 hours  - Fluids: Normal saline 1L bolus was given - Elevated the head of  the bed - Respiratory panel pending - Blood cultures pending  - MRI pending  3. Acute Cystitis vs asymptomatic bacteruria - UA showed nitrite positive, moderate leukocytes, and many bacteria. Patient has had decreased urine output that was contributed to decreased oral intake by her caretaker. Bladder scan only showed 8 cc.   4. Hypoglycemia: blood glucose was 69 mg/dL to 88 mg/dL. - 50% dextrose solution 50 mL IV - Dextrose 5%-0.45% sodium chloride IV 75 ml/h  5. Hypothyroidism - History of hypothyroidism presenting with hypothermia and acute mental status change. TSH is WNL at 0.586.  Diet: NPO VTE: Enoxaparin 40 mg IVF: Normal saline  Code: Full (confirm with sister)  Dispo: Admit patient to Inpatient with expected length of stay greater than 2 midnights.  Signed: Marianna Payment, MD 02/24/2019, 2:50 AM  Pager: [phone]

## 2019-02-23 NOTE — ED Notes (Signed)
Pt is NSR on monitor 

## 2019-02-24 ENCOUNTER — Encounter: Payer: Self-pay | Admitting: Internal Medicine

## 2019-02-24 ENCOUNTER — Inpatient Hospital Stay (HOSPITAL_COMMUNITY): Payer: Medicare Other

## 2019-02-24 DIAGNOSIS — Z79890 Hormone replacement therapy (postmenopausal): Secondary | ICD-10-CM

## 2019-02-24 DIAGNOSIS — G4730 Sleep apnea, unspecified: Secondary | ICD-10-CM

## 2019-02-24 DIAGNOSIS — I959 Hypotension, unspecified: Secondary | ICD-10-CM

## 2019-02-24 DIAGNOSIS — Z8673 Personal history of transient ischemic attack (TIA), and cerebral infarction without residual deficits: Secondary | ICD-10-CM

## 2019-02-24 DIAGNOSIS — R0981 Nasal congestion: Secondary | ICD-10-CM

## 2019-02-24 DIAGNOSIS — Q909 Down syndrome, unspecified: Secondary | ICD-10-CM

## 2019-02-24 DIAGNOSIS — J189 Pneumonia, unspecified organism: Secondary | ICD-10-CM | POA: Diagnosis present

## 2019-02-24 DIAGNOSIS — E785 Hyperlipidemia, unspecified: Secondary | ICD-10-CM

## 2019-02-24 DIAGNOSIS — G9340 Encephalopathy, unspecified: Secondary | ICD-10-CM

## 2019-02-24 DIAGNOSIS — E039 Hypothyroidism, unspecified: Secondary | ICD-10-CM

## 2019-02-24 DIAGNOSIS — I739 Peripheral vascular disease, unspecified: Secondary | ICD-10-CM

## 2019-02-24 DIAGNOSIS — G9341 Metabolic encephalopathy: Secondary | ICD-10-CM | POA: Diagnosis present

## 2019-02-24 DIAGNOSIS — N3000 Acute cystitis without hematuria: Secondary | ICD-10-CM | POA: Diagnosis present

## 2019-02-24 DIAGNOSIS — F0391 Unspecified dementia with behavioral disturbance: Secondary | ICD-10-CM

## 2019-02-24 LAB — RESPIRATORY PANEL BY PCR

## 2019-02-24 LAB — COMPREHENSIVE METABOLIC PANEL
ALT: 11 U/L (ref 0–44)
AST: 20 U/L (ref 15–41)
Albumin: 2.6 g/dL — ABNORMAL LOW (ref 3.5–5.0)
Alkaline Phosphatase: 51 U/L (ref 38–126)
Anion gap: 11 (ref 5–15)
BUN: 6 mg/dL — ABNORMAL LOW (ref 8–23)
CO2: 21 mmol/L — ABNORMAL LOW (ref 22–32)
Calcium: 8.1 mg/dL — ABNORMAL LOW (ref 8.9–10.3)
Chloride: 110 mmol/L (ref 98–111)
Creatinine, Ser: 0.78 mg/dL (ref 0.44–1.00)
GFR calc Af Amer: >60 mL/min (ref >60)
GFR calc non Af Amer: >60 mL/min (ref >60)
Glucose, Bld: 97 mg/dL (ref 70–99)
Potassium: 3.4 mmol/L — ABNORMAL LOW (ref 3.5–5.1)
Sodium: 142 mmol/L (ref 135–145)
Total Bilirubin: 0.8 mg/dL (ref 0.3–1.2)
Total Protein: 5.5 g/dL — ABNORMAL LOW (ref 6.5–8.1)

## 2019-02-24 LAB — CBC WITH DIFFERENTIAL/PLATELET
Abs Immature Granulocytes: 0.28 10*3/uL — ABNORMAL HIGH (ref 0.00–0.07)
Basophils Absolute: 0.1 10*3/uL (ref 0.0–0.1)
Basophils Relative: 1 %
Eosinophils Absolute: 0 10*3/uL (ref 0.0–0.5)
Eosinophils Relative: 1 %
HCT: 41.7 % (ref 36.0–46.0)
Hemoglobin: 13.7 g/dL (ref 12.0–15.0)
Immature Granulocytes: 5 %
Lymphocytes Relative: 18 %
Lymphs Abs: 1 10*3/uL (ref 0.7–4.0)
MCH: 31.6 pg (ref 26.0–34.0)
MCHC: 32.9 g/dL (ref 30.0–36.0)
MCV: 96.1 fL (ref 80.0–100.0)
Monocytes Absolute: 0.6 10*3/uL (ref 0.1–1.0)
Monocytes Relative: 11 %
Neutro Abs: 3.5 10*3/uL (ref 1.7–7.7)
Neutrophils Relative %: 64 %
Platelets: 150 10*3/uL (ref 150–400)
RBC: 4.34 MIL/uL (ref 3.87–5.11)
RDW: 15.4 % (ref 11.5–15.5)
WBC: 5.4 10*3/uL (ref 4.0–10.5)
nRBC: 0 % (ref 0.0–0.2)

## 2019-02-24 LAB — CBG MONITORING, ED
Glucose-Capillary: 88 mg/dL (ref 70–99)
Glucose-Capillary: 92 mg/dL (ref 70–99)
Glucose-Capillary: 64 mg/dL — ABNORMAL LOW (ref 70–99)
Glucose-Capillary: 87 mg/dL (ref 70–99)

## 2019-02-24 LAB — AMMONIA: Ammonia: 15 umol/L (ref 9–35)

## 2019-02-24 LAB — CORTISOL-AM, BLOOD: Cortisol - AM: 12.7 ug/dL (ref 6.7–22.6)

## 2019-02-24 LAB — VALPROIC ACID LEVEL: Valproic Acid Lvl: 52 ug/mL (ref 50.0–100.0)

## 2019-02-24 MED ORDER — GADOBUTROL 1 MMOL/ML IV SOLN
6.0000 mL | Freq: Once | INTRAVENOUS | Status: AC | PRN
  Administered 2019-02-24: 6 mL via INTRAVENOUS

## 2019-02-24 MED ORDER — THIAMINE HCL 100 MG/ML IJ SOLN
500.0000 mg | Freq: Once | INTRAVENOUS | Status: AC
  Administered 2019-02-24: 500 mg via INTRAVENOUS
  Filled 2019-02-24: qty 5

## 2019-02-24 MED ORDER — FOLIC ACID 5 MG/ML IJ SOLN
1.0000 mg | Freq: Every day | INTRAMUSCULAR | Status: DC
  Administered 2019-02-25 – 2019-02-26 (×2): 1 mg via INTRAVENOUS
  Filled 2019-02-24 (×3): qty 0.2

## 2019-02-24 MED ORDER — THIAMINE HCL 100 MG/ML IJ SOLN
100.0000 mg | Freq: Every day | INTRAMUSCULAR | Status: DC
  Administered 2019-02-25 – 2019-02-26 (×2): 100 mg via INTRAVENOUS
  Filled 2019-02-24 (×2): qty 2

## 2019-02-24 MED ORDER — SODIUM CHLORIDE 0.9 % IV BOLUS
1000.0000 mL | Freq: Once | INTRAVENOUS | Status: AC
  Administered 2019-02-24: 1000 mL via INTRAVENOUS

## 2019-02-24 MED ORDER — FOLIC ACID 5 MG/ML IJ SOLN
1.0000 mg | Freq: Every day | INTRAMUSCULAR | Status: AC
  Administered 2019-02-24: 1 mg via INTRAVENOUS
  Filled 2019-02-24: qty 0.2

## 2019-02-24 NOTE — Addendum Note (Signed)
Addended by: Truddie Crumble on: 02/24/2019 04:37 PM   Modules accepted: Orders

## 2019-02-24 NOTE — Progress Notes (Signed)
  Date: 02/24/2019  Patient name: Amanda Castillo  Medical record number: [id number]  Date of birth: 07-15-1955   I have seen and evaluated this patient and I have discussed the plan of care with the house staff. Please see their note for complete details. I concur with their findings with the following additions/corrections:   63 year old woman with Down syndrome, dementia, hypothyroidism, and prior stroke who presented with 2 weeks of progressive somnolence and lethargy as well as cough, nasal congestion, and poor appetite.  This occurred in the context of a few months of generalized decline in her behavior that has included decreased eating, decreased mobility, and more behavioral disturbances.  Because of outbursts of yelling and emotional distress, she recently had her lorazepam dose increased and was switched from doxepin to valproic acid by her psychiatrist.  That medication change occurred approximately 2 weeks ago and she has had more rapid decline in her functional status since then. No fevers or infectious symptoms.  Vitals are significant for low temp down to 95.9, intermittent bradycardia down to 50, hypotension as low as 88/38 overnight, and intermittent mild tachypnea.  Physical exam: General: Lying in bed, lethargic with minimal responsiveness to verbal or tactile stimuli HEENT: MMM, no lymphadenopathy, no mastoid swelling or tenderness, characteristic features of Down syndrome CV: RRR, no murmurs, no edema or JVD Pulmonary: Normal respiratory effort, rhonchorous throughout Abdomen: Soft, no apparent tenderness, active bowel sounds Extremities: No synovitis Skin: Patches of hyperpigmentation versus ecchymosis on both legs, few scattered petechiae on both feet Neuro: Withdraws to light touch in both legs, eventually had an incoherent verbal response to questioning, but did not open eyes.  Pupils both pinpoint bilaterally but slightly reactive  Significant test results  include: ABG 7.44/34/86/23 Lactate 1.5 BMP unremarkable Albumin 2.6, remainder of LFTs unremarkable CBC unremarkable Procalcitonin negative INR 1.1, PTT 26 Valproic acid level appropriate at 52 TSH 0.6 SARS-CoV-2 negative, RVP negative UA moderate LE, positive nitrites, 11-20 WBCs Urine tox appropriately positive for benzodiazepines  CT head with no acute changes but chronic atrophy and prior infarction noted CXR with low lung volumes CT chest with bilateral interstitial prominence and small bilateral pleural effusions MRI brain with no acute infarction, significant generalized atrophy and old infarctions with volume loss.  Ventricles are noted to be slightly larger than 5 years ago, due to progressive atrophy versus NPH  In summary, this is a 63 year old woman with Down syndrome, dementia, hypothyroidism, and prior strokes admitted for 2 weeks of progressive lethargy and somnolence in the context of general decline in the last few months.  Unclear if this is progression of a chronic process or more acute decompensation.  Differential includes medication effect, infection, NPH, psychiatric disease, or progression of her underlying dementia. Although petechiae raise concern for sepsis, normal platelets and coags and negative lactate and procalcitonin are reassuring. Pyuria does suggest UTI, will continue CTX, as well as azithromycin for possible atypical CAP.   Will hold valproic acid and lorazepam and see if mental status improves. If not improving, could consider LP to assess opening pressure for possible NPH.  Lenice Pressman, M.D., Ph.D. 02/24/2019, 2:55 PM

## 2019-02-24 NOTE — ED Notes (Signed)
Patient transported to MRI 

## 2019-02-24 NOTE — Progress Notes (Signed)
   Subjective: Patients caretaker at bedisde. She reports that patient has been having decreased appetite and decreased in her yelling. She denies any signs of pain or symptoms that she noted. The medications were adjusted due to her increase in yelling and agitation. She reports that her depakote replaced the doxepin and Ativan was increased. Taking ativan 2 mg nightly. She has had decreased fluid intake and decreased urination. No fevers noted.   Objective:  Vital signs in last 24 hours: Vitals:   02/24/19 0605 02/24/19 0701 02/24/19 0842 02/24/19 0930  BP: (!) 130/92  128/60 (!) 108/91  Pulse: 69  (!) 50 65  Resp: 11  12 12   Temp:  (!) 97.5 F (36.4 C)    TempSrc:  Oral    SpO2: 98%  98% 98%  Weight:      Height:        General: Middle aged female, resting comfortably Cardiac: RRR, no m/r/g Pulmonary: Auscultation anteriorly, diffuse rhonchi. Patient sleeping.   Abdomen: Normal bowel sound , soft Extremity: Bruising bilateral in lower extremities (reported chronic) , petechia on feet Neuro: Small pupils, reactive to light and accomodation, moves lower extremities to touch    Assessment/Plan:  Active Problems:   Down syndrome   Hypothyroidism   Dementia associated with other underlying disease with behavioral disturbance (HCC)   Lethargy   Acute metabolic encephalopathy   Acute cystitis   Atypical pneumonia  Amanda Castillo is a 63 - year old female with a pertinent past medical history of Down Syndrome, HLD, PVD, Stroke, and sleep apnea who presented for AMS. On presentation caretaker reports associated lethargy, cough, anorexia, and nasal congestion progressing over 2 weeks.   #Acute encephalopathy 2/2 possible atypical pneumonia vs UTI vs metabolic encephalopathy : Caretaker reporting slow decline over the past two weeks. Decreased intake and urine output. She has been more sedated and this would put her at risk for aspiration. Resp viral panel negative. No clear  picture at this point in workup, but patient started on Abx based on CT chest showing possible atypical pneumonia. In addition UA positive for nitrites, leukocytes and many bacteria. Caretaker has not appreciated any UTI signs or symptoms. Likely asymptomatic anemia.  Holding Depakote and ativan.  - Thiamine , folic acid - follow blood cultures - follow urine cultures - ammonia level -Trend CBC -Continue azithromycin and ceftriaxone, DAY 2 - Continue 5% dextrose 1/2 NS IV 75 ml/hr  #Hypothyrodism TSH WNL - Continue levothyroxine    FEN: NS 75 cc/hr, replete lytes prn, NPO VTE ppx: Lovenox  Code Status: FULL    Dispo: Anticipated discharge is pending clinical improvement.   Tamsen Snider, MD PGY1  [phone]

## 2019-02-24 NOTE — Evaluation (Signed)
Physical Therapy Evaluation Patient Details Name: Amanda Castillo MRN: [id number] DOB: 07-04-1955 Today's Date: 02/24/2019   History of Present Illness  63yo female with lethargy associated with cough, anorexia, and nasal congestion. Covid test negative. PMH Downs, CVA, dementia, orthostatic hypotension  Clinical Impression   Per chart review, glucose 64 this morning; notified RN, who re-checked and found glucose to be 87 and reports she is safe to participate in PT. Caregiver present and provided all history regarding PLOF and equipment as patient is lethargic and with history of cognitive impairment/unable to accurately answer questions. Attempted rolling in bed and LE ROM/MMT in bed, patient totalA for all activity and unable to follow cues for ROM/MMT, remained lethargic during session. Caregiver reports they would like to keep her at home due to concerns of comfort and performance in a SNF given cognitive impairments. She may benefit from trial of skilled PT services in the acute setting, and recommend skilled HHPT services as well as regular personal care attendant to assist caregiver at home moving forward.     Follow Up Recommendations Home health PT;Supervision/Assistance - 24 hour;Other (comment)(personal care attendant)    Equipment Recommendations  None recommended by PT(appears to have all necessary DME)    Recommendations for Other Services       Precautions / Restrictions Precautions Precautions: Other (comment);Fall Precaution Comments: Downs, dementia Restrictions Weight Bearing Restrictions: No      Mobility  Bed Mobility Overal bed mobility: Needs Assistance Bed Mobility: Rolling Rolling: Total assist         General bed mobility comments: attempted rolling and B LE MMT/ROM in bed, patient totalA for all activities- will require +2 assist for further activities  Transfers                 General transfer comment: will require +2  assist  Ambulation/Gait             General Gait Details: unable at baseline  Stairs            Wheelchair Mobility    Modified Rankin (Stroke Patients Only)       Balance                                             Pertinent Vitals/Pain Pain Assessment: Faces Pain Score: 0-No pain Faces Pain Scale: No hurt Pain Intervention(s): Monitored during session    Home Living Family/patient expects to be discharged to:: Other (Comment)(lives with AFL provider/caregiver)                 Additional Comments: single story home with ramped entrance, has hospital bed and WC, also have hoyer lift but patient gets scared when in lift    Prior Function Level of Independence: Needs assistance   Gait / Transfers Assistance Needed: totalAx2  ADL's / Homemaking Assistance Needed: totalA        Hand Dominance        Extremity/Trunk Assessment   Upper Extremity Assessment Upper Extremity Assessment: Generalized weakness    Lower Extremity Assessment Lower Extremity Assessment: Generalized weakness    Cervical / Trunk Assessment Cervical / Trunk Assessment: Kyphotic  Communication   Communication: Other (comment)(Downs)  Cognition Arousal/Alertness: Lethargic Behavior During Therapy: Flat affect Overall Cognitive Status: Difficult to assess  General Comments: patient appeared to be sleeping soundly during eval, caregiver reports she is definitely "playing possum"      General Comments General comments (skin integrity, edema, etc.): unable to assess balance today, will require +2 assist to get to EOB    Exercises     Assessment/Plan    PT Assessment Patient needs continued PT services  PT Problem List Decreased strength;Decreased mobility;Decreased safety awareness;Decreased coordination;Obesity;Decreased activity tolerance;Decreased cognition;Decreased balance       PT Treatment  Interventions DME instruction;Therapeutic activities;Gait training;Therapeutic exercise;Patient/family education;Stair training;Balance training;Functional mobility training;Neuromuscular re-education    PT Goals (Current goals can be found in the Care Plan section)  Acute Rehab PT Goals Patient Stated Goal: return home PT Goal Formulation: With family Time For Goal Achievement: 03/10/19 Potential to Achieve Goals: Fair    Frequency Min 2X/week   Barriers to discharge        Co-evaluation               AM-PAC PT "6 Clicks" Mobility  Outcome Measure Help needed turning from your back to your side while in a flat bed without using bedrails?: Total Help needed moving from lying on your back to sitting on the side of a flat bed without using bedrails?: Total Help needed moving to and from a bed to a chair (including a wheelchair)?: Total Help needed standing up from a chair using your arms (e.g., wheelchair or bedside chair)?: Total Help needed to walk in hospital room?: Total Help needed climbing 3-5 steps with a railing? : Total 6 Click Score: 6    End of Session   Activity Tolerance: Patient limited by lethargy Patient left: in bed;with call bell/phone within reach;with family/visitor present   PT Visit Diagnosis: Muscle weakness (generalized) (M62.81);Unsteadiness on feet (R26.81);Other abnormalities of gait and mobility (R26.89)    Time: 1210-1223 PT Time Calculation (min) (ACUTE ONLY): 13 min   Charges:   PT Evaluation $PT Eval High Complexity: 1 High          Deniece Ree PT, DPT, CBIS  Supplemental Physical Therapist [place]    Pager [phone] Acute Rehab Office [phone]

## 2019-02-24 NOTE — Progress Notes (Signed)
Patient arrived to unit. Caregiver at bedside.

## 2019-02-24 NOTE — Addendum Note (Signed)
Addended by: Truddie Crumble on: 02/24/2019 08:39 AM   Modules accepted: Orders

## 2019-02-24 NOTE — ED Notes (Signed)
Pt resting comfortably on hospital bed 

## 2019-02-24 NOTE — Assessment & Plan Note (Addendum)
poor oral intake x1 week. Two weeks ago she was yelling a lot, put on depakote by psychiatry. Ativan was also increased. Became more sleepy. Mucous came out with a sneeze and cough yellow.  Caretakers feel she has had a weak cough overall.  Has not been swallowing her medication.  Pt also has dementia not eating well or drinking much at all.  Last time patient ate was on Saturday a single bite of chicken noodle soup.  Has been choking after eating.  She has been drooling.  Less interactive more lethargic and sleepy.  No noticeable fevers or chills they are checking with forehead thermometer.  Distal extremities very cool on exam, pts blood pressure is at her baseline.  DDx includes infection and possible aspiration pna, medication side effects, worsening cardiac function, Progression of her chronic disease  -ordered cmp and cbc, blood cultures but have had difficulty collecting in clinic -no beds available for direct admission we will transport pt to ED for initial workup and admission

## 2019-02-25 ENCOUNTER — Inpatient Hospital Stay (HOSPITAL_COMMUNITY): Payer: Medicare Other

## 2019-02-25 DIAGNOSIS — N390 Urinary tract infection, site not specified: Secondary | ICD-10-CM

## 2019-02-25 DIAGNOSIS — B9620 Unspecified Escherichia coli [E. coli] as the cause of diseases classified elsewhere: Secondary | ICD-10-CM

## 2019-02-25 LAB — GLUCOSE, CAPILLARY
Glucose-Capillary: 108 mg/dL — ABNORMAL HIGH (ref 70–99)
Glucose-Capillary: 119 mg/dL — ABNORMAL HIGH (ref 70–99)
Glucose-Capillary: 97 mg/dL (ref 70–99)
Glucose-Capillary: 111 mg/dL — ABNORMAL HIGH (ref 70–99)
Glucose-Capillary: 105 mg/dL — ABNORMAL HIGH (ref 70–99)
Glucose-Capillary: 155 mg/dL — ABNORMAL HIGH (ref 70–99)
Glucose-Capillary: 128 mg/dL — ABNORMAL HIGH (ref 70–99)

## 2019-02-25 LAB — BASIC METABOLIC PANEL
Anion gap: 14 (ref 5–15)
BUN: <5 mg/dL — ABNORMAL LOW (ref 8–23)
CO2: 20 mmol/L — ABNORMAL LOW (ref 22–32)
Calcium: 8.5 mg/dL — ABNORMAL LOW (ref 8.9–10.3)
Chloride: 111 mmol/L (ref 98–111)
Creatinine, Ser: 0.66 mg/dL (ref 0.44–1.00)
GFR calc Af Amer: >60 mL/min (ref >60)
GFR calc non Af Amer: >60 mL/min (ref >60)
Glucose, Bld: 106 mg/dL — ABNORMAL HIGH (ref 70–99)
Potassium: 3.1 mmol/L — ABNORMAL LOW (ref 3.5–5.1)
Sodium: 145 mmol/L (ref 135–145)

## 2019-02-25 LAB — LEGIONELLA PNEUMOPHILA SEROGP 1 UR AG: L. pneumophila Serogp 1 Ur Ag: NEGATIVE

## 2019-02-25 LAB — URINE CULTURE: Culture: >=100000 — AB

## 2019-02-25 LAB — PROCALCITONIN: Procalcitonin: <0.1 ng/mL

## 2019-02-25 MED ORDER — ORAL CARE MOUTH RINSE
15.0000 mL | Freq: Two times a day (BID) | OROMUCOSAL | Status: DC
  Administered 2019-02-26: 15 mL via OROMUCOSAL

## 2019-02-25 MED ORDER — CHLORHEXIDINE GLUCONATE 0.12 % MT SOLN
15.0000 mL | Freq: Two times a day (BID) | OROMUCOSAL | Status: DC
  Administered 2019-02-25 – 2019-02-26 (×2): 15 mL via OROMUCOSAL
  Filled 2019-02-25 (×2): qty 15

## 2019-02-25 MED ORDER — DIVALPROEX SODIUM 125 MG PO CSDR
250.0000 mg | DELAYED_RELEASE_CAPSULE | Freq: Three times a day (TID) | ORAL | Status: DC
  Administered 2019-02-25 – 2019-02-26 (×4): 250 mg via ORAL
  Filled 2019-02-25 (×4): qty 2

## 2019-02-25 MED ORDER — POTASSIUM CHLORIDE 10 MEQ/100ML IV SOLN
10.0000 meq | INTRAVENOUS | Status: DC
  Administered 2019-02-25 (×5): 10 meq via INTRAVENOUS
  Filled 2019-02-25 (×6): qty 100

## 2019-02-25 MED ORDER — ORAL CARE MOUTH RINSE
15.0000 mL | Freq: Two times a day (BID) | OROMUCOSAL | Status: DC
  Administered 2019-02-26: 17:00:00 15 mL via OROMUCOSAL

## 2019-02-25 MED ORDER — POTASSIUM CHLORIDE 20 MEQ PO PACK
40.0000 meq | PACK | Freq: Once | ORAL | Status: AC
  Administered 2019-02-25: 40 meq via ORAL
  Filled 2019-02-25: qty 2

## 2019-02-25 MED ORDER — RESOURCE THICKENUP CLEAR PO POWD
ORAL | Status: DC | PRN
  Filled 2019-02-25: qty 125

## 2019-02-25 NOTE — Progress Notes (Signed)
OT Cancellation Note  Patient Details Name: IRAM HEIMANN MRN: [id number] DOB: 11-25-1955   Cancelled Treatment:    Reason Eval/Treat Not Completed: Patient at procedure or test/ unavailable; pt and pt's bed out of room, off unit. Will follow up for OT eval as schedule permits.  Lou Cal, OT Supplemental Rehabilitation Services Pager [phone] Office [phone]   Raymondo Band 02/25/2019, 10:46 AM

## 2019-02-25 NOTE — TOC Initial Note (Signed)
Transition of Care [facility]) - Initial/Assessment Note    Patient Details  Name: Amanda Castillo MRN: [id number] Date of Birth: 1956-01-23  Transition [facility]) CM/SW Contact:    Pollie Friar, RN Phone Number: 02/25/2019, 1:17 PM  Clinical Narrative:                 CM met with the patient and her caregiver. They would like [place] therapies when she returns home. Caregiver: [name] requested [facility]. Cory with [facility] accepted the referral.  TOC following for d/c needs. MD please place [place] orders.  Expected Discharge Plan: [place] Barriers to Discharge: Continued Medical Work up   Patient Goals and CMS Choice   CMS Medicare.gov Compare Post Acute Care list provided to:: Patient Represenative (must comment) Choice offered to / list presented to : [place] / Guardian  Expected Discharge Plan and Services Expected Discharge Plan: [place]   Discharge Planning Services: CM Consult Post Acute Care Choice: [place] arrangements for the past 2 months: Single Family Home                                      Prior Living Arrangements/Services Living arrangements for the past 2 months: Single Family Home Lives with:: Other (Comment)(caregivers) Patient language and need for interpreter reviewed:: Yes(no needs)        Need for Family Participation in Patient Care: Yes (Comment) Care giver support system in place?: Yes (comment)(has 24 hour caregivers) Current home services: DME(hospital bed, 3 in 1, hoyer, wheelchair) Criminal Activity/Legal Involvement Pertinent to Current Situation/Hospitalization: No - Comment as needed  Activities of Daily Living      Permission Sought/Granted                  Emotional Assessment Appearance:: Appears older than stated age         Psych Involvement: No (comment)  Admission diagnosis:  Sepsis without acute organ dysfunction, due to unspecified organism [facility]) [A41.9] Patient  Active Problem List   Diagnosis Date Noted  . Acute metabolic encephalopathy 05/25/2445  . Acute cystitis 02/24/2019  . Atypical pneumonia 02/24/2019  . Lethargy 02/23/2019  . Bed sore 01/24/2019  . History of ongoing treatment with high-risk medication 01/24/2019  . Osteopenia 06/23/2017  . Incontinence of urine in female 06/23/2017  . Vitamin D deficiency 03/19/2017  . At high risk for falls 03/19/2017  . Candidal intertrigo 08/28/2016  . Allergic rhinitis 02/28/2016  . Health care maintenance 07/27/2015  . Dementia associated with other underlying disease with behavioral disturbance ([place]) 05/18/2015  . Down syndrome 03/24/2014  . Hyperlipidemia 03/24/2014  . Hypothyroidism 03/24/2014  . Sleep apnea 03/24/2014   PCP:  Dewayne Hatch, MD Pharmacy:   [facility] [address] [place] [address] [place]  [address] Phone: [phone] Fax: [phone]     Social Determinants of Health (SDOH) Interventions    Readmission Risk Interventions No flowsheet data found.

## 2019-02-25 NOTE — Progress Notes (Signed)
   Subjective:  Caregiver at bedside, patient was closer to her baseline. She was crying out and grinding her teeth. She stated that she was ready to go home. She had asked her if anything hurt and patient said no, caregiver asked patient if she wanted to call sister and patient said yes.   Objective:  Vital signs in last 24 hours: Vitals:   02/24/19 1845 02/24/19 2013 02/24/19 2300 02/25/19 0435  BP: (!) 125/106 132/60 120/82   Pulse: 61 64 66   Resp: 16 17 16    Temp: 98.4 F (36.9 C) 97.8 F (36.6 C) 98.6 F (37 C)   TempSrc: Oral Oral Oral   SpO2: (!) 81% 98% 100%   Weight:    65 kg  Height:        General: Middle aged female, resting in bed , grinding teeth.  Cardiac: RRR, no m/r/g  Abdomen: Normal bowel sound , soft Extremity: Bruising bilateral in lower extremities (reported chronic) , petechia on feet     Assessment/Plan:  Principal Problem:   Acute metabolic encephalopathy Active Problems:   Down syndrome   Hypothyroidism   Dementia associated with other underlying disease with behavioral disturbance (HCC)   Lethargy   Acute cystitis   Atypical pneumonia  Atena Sayre is a 49 - year old female with a pertinent past medical history of Down Syndrome, HLD, PVD, Stroke, and sleep apnea who presented for AMS. On presentation caretaker reports associated lethargy, cough, anorexia, and nasal congestion progressing over 2 weeks.   Caretaker reporting slow decline over the past two weeks. Decreased intake and urine output. She has been more sedated and this would put her at risk for aspiration. Resp viral panel negative. No clear picture at this point in workup, but patient started on Abx based on CT chest showing possible atypical pneumonia.  #Acute encephalopathy 2/2 possible atypical pneumonia and UTI  In addition UA positive for nitrites, leukocytes and many bacteria. Urine Cx grew >100,000 col/ml of E.Coli. Patient can not answer to symptoms, so will treat for  UTI. Will finish treating CAP. Will consider swithching to oral Abx if patient is ready for discharge tomorrow. Patient responded quickly after starting Abx, likely too quickly to be the Depakote. Will restart Depakote tonight and assess tomorrow for continued improvement. Additional negative findings , Bcx ngtd. Afebrile and normal WBC count.  Appreciate SLP , CM, and OT notes.  - Thiamine , folic acid -Trend CBC -Continue azithromycin Day 3/3. Ceftriaxone Day 3/5 for CAP.  - Continue 5% dextrose 1/2 NS IV 75 ml/hr - Depakote sprinkles , 250 mg TID  #Hypothyrodism TSH WNL - Continue levothyroxine    FEN: NS 75 cc/hr, replete lytes prn, NPO VTE ppx: Lovenox  Code Status: FULL    Dispo: Anticipated discharge is pending clinical improvement.   Tamsen Snider, MD PGY1  [phone]

## 2019-02-25 NOTE — Evaluation (Signed)
Clinical/Bedside Swallow Evaluation Patient Details  Name: Amanda Castillo MRN: [id number] Date of Birth: 17-Jul-1955  Today's Date: 02/25/2019 Time: SLP Start Time (ACUTE ONLY): 0900 SLP Stop Time (ACUTE ONLY): 0935 SLP Time Calculation (min) (ACUTE ONLY): 35 min  Past Medical History:  Past Medical History:  Diagnosis Date  . Allergy   . Dementia ([place])   . Depression   . Down's syndrome   . Hallucinations   . Hyperlipidemia   . Hypothyroidism   . Moderate intellectual disability   . Orthostatic hypotension 06/06/2015  . PVD (peripheral vascular disease) ([place])   . Sleep apnea   . Stroke [facility])    in 2006   Past Surgical History:  Past Surgical History:  Procedure Laterality Date  . COLONOSCOPY  06/12/2012  . orif lt foot    . TONSILLECTOMY     HPI:  63 year old woman with Down syndrome, dementia, hypothyroidism, and prior stroke (2006) who presented with 2 weeks of progressive somnolence and lethargy as well as cough, nasal congestion, and poor appetite. This occurred in the context of a few months of generalized decline in her behavior that has included decreased eating, decreased mobility, and more behavioral disturbances. MRI = No acute brain insult. Generalized atrophy. Large old left frontalcortical and subcortical infarction. Smaller old right parietal/cortical and subcortical infarction.   Assessment / Plan / Recommendation Clinical Impression  Pt seen at bedside for clinical swallow evaluation. Caregiver reports decline in po intake over the past month, which she has attributed to dementia. Pt has also started exhibiting choking behaviors with po intake, but has a history of being manipulative and faking symptoms (seizures). Following one slight coughing episode, caregiver reported "That means she wants something to drink".   Given CXR results, intermittent congested nonproductive cough, recent history, hx dementia and bedside presentation, instrumental assessment is  recommended to objectively assess swallow function and safety, and to identify least restrictive diet. MBS scheduled with radiology at 1030 this date. RN and MD informed. Report to follow.    SLP Visit Diagnosis: Dysphagia, unspecified (R13.10)    Aspiration Risk  Moderate aspiration risk    Diet Recommendation NPO(pending MBS results and recommendations)        Other  Recommendations Oral Care Recommendations: Oral care QID   Follow up Recommendations 24 hour supervision/assistance      Frequency and Duration (pending MBS results/recommendations)          Prognosis Prognosis for Safe Diet Advancement: Good Barriers to Reach Goals: Cognitive deficits      Swallow Study   General Date of Onset: 02/23/19 HPI: 63 year old woman with Down syndrome, dementia, hypothyroidism, and prior stroke who presented with 2 weeks of progressive somnolence and lethargy as well as cough, nasal congestion, and poor appetite. This occurred in the context of a few months of generalized decline in her behavior that has included decreased eating, decreased mobility, and more behavioral disturbances. MRI = No acute brain insult. Generalized atrophy. Large old left frontalcortical and subcortical infarction. Smaller old right parietalcortical and subcortical infarction. Type of Study: Bedside Swallow Evaluation Previous Swallow Assessment: no prior ST intervention Diet Prior to this Study: NPO Temperature Spikes Noted: No Respiratory Status: Room air History of Recent Intubation: No Behavior/Cognition: Alert;Requires cueing;Doesn't follow directions Oral Cavity Assessment: Within Functional Limits Oral Care Completed by SLP: No Oral Cavity - Dentition: Missing dentition;Adequate natural dentition(grinding teeth intermittently) Vision: Impaired for self-feeding Self-Feeding Abilities: Total assist Patient Positioning: Upright in bed Baseline Vocal Quality: Normal Volitional Cough:  Cognitively unable  to elicit Volitional Swallow: Unable to elicit    Oral/Motor/Sensory Function Overall Oral Motor/Sensory Function: Generalized oral weakness   Ice Chips Ice chips: Within functional limits Presentation: Spoon   Thin Liquid Thin Liquid: Within functional limits Presentation: Straw    Nectar Thick Nectar Thick Liquid: Not tested   Honey Thick Honey Thick Liquid: Not tested   Puree Puree: Within functional limits Presentation: Spoon   Solid     Solid: Within functional limits     Amanda Castillo B. [facility], [place] Speech Language Pathologist [phone]  Shonna Chock 02/25/2019,9:54 AM

## 2019-02-25 NOTE — Progress Notes (Signed)
  Date: 02/25/2019  Patient name: Amanda Castillo  Medical record number: [id number]  Date of birth: 06/30/1955   I have seen and evaluated this patient and I have discussed the plan of care with the house staff. Please see their note for complete details. I concur with their findings with the following additions/corrections:   Much improved today, caregiver [name] says she is back to baseline. Would not have expected such quick recovery from stopping valproic acid, so suspect it was more related to UTI and possible CAP. Will continue antibiotics for total 5 day course.  Still not eating well, MBS today showed significant residue with significant aspiration risk, unclear how much of this is acute change due to illness vs chronic decline from dementia. SLP has recommended palliative consult, which may be appropriate, but would prefer to let her recover from this acute illness then readdress as an outpatient. Per SLP, ok to begin solids and honey thick liquids.  We will cautiously resume valproic acid today. If she remains at baseline tomorrow and is able to demonstrate adequate oral intake, will plan for discharge.  Lenice Pressman, M.D., Ph.D. 02/25/2019, 4:31 PM

## 2019-02-25 NOTE — Progress Notes (Signed)
Modified Barium Swallow Progress Note  Patient Details  Name: Amanda Castillo MRN: [id number] Date of Birth: 1956-02-17  Today's Date: 02/25/2019  Modified Barium Swallow completed.  Full report located under Chart Review in the Imaging Section.  Brief recommendations include the following:  Clinical Impression Pt seen in radiology for MBS. Caregiver present during this study. Pt presents with moderate oropharyngeal dysphagia and high risk of aspiration of all consistencies.   Orally, pt exhibits decreased bolus formation and holding of puree and solid textures. Posterior spilage over tongue base noted across consistencies.  More oral residue was noted on puree.   Pharyngeally, pt exhibits trigger of the swallow reflex at the level of the vallecular sinus across consistencies. Decreased airway closure resulted in penetration during the swallow of thin, nectar, and puree consistencies, with eventual aspiration of this material. Diffuse vallecular and pyriform sinus residue contributes to aspiration risk.   Honey thick liquids and solid textures did not exhibit penetration, however, the risk is still high due to post swallow residue and oral residue. Further, pt is unable to consistently follow commands to cough, clear her throat, or swallow again.   Will begin regular solids to allow full range of solid po choices. Liquids will need to be honey thick to minimize aspiration risk. Recommend crushed meds in puree. Also recommend consideration of palliative care consult to facilitate establishment of goals of care for this patient, as continued deterioration of swallow function is likely with advancing dementia. Results and recommendations were reviewed with pt's primary caregiver. SLP will continue to follow acutely to provide education.   Swallow Evaluation Recommendations  SLP Diet Recommendations: Regular solids;Honey thick liquids   Liquid Administration via: Straw   Medication  Administration: Crushed with puree   Supervision: Full assist for feeding;Full supervision/cueing for compensatory strategies   Compensations: Minimize environmental distractions;Slow rate;Small sips/bites;Hard cough after swallow;Clear throat intermittently;Multiple dry swallows after each bite/sip   Postural Changes: Remain semi-upright after after feeds/meals (Comment);Seated upright at 90 degrees   Oral Care Recommendations: Oral care QID   Other Recommendations: Order thickener from pharmacy;Remove water pitcher   Amanda Castillo [facility], [place] Speech Language Pathologist [phone]  Shonna Chock 02/25/2019,12:31 PM

## 2019-02-25 NOTE — Evaluation (Addendum)
Occupational Therapy Evaluation Patient Details Name: Amanda Castillo MRN: [id number] DOB: Oct 23, 1955 Today's Date: 02/25/2019    History of Present Illness 63yo female with lethargy associated with cough, anorexia, and nasal congestion. Covid test negative. PMH Downs, CVA, dementia, orthostatic hypotension   Clinical Impression   This 63 y/o female presents with the above. Caregiver present during session and providing PLOF/home setup information. PTA pt was requiring totalA for ADL tasks including self-feeding (pt's caregiver reports pt typically refuses to feed herself); requires assist for transfers OOB to w/c (has hoyer but pt fearful of it). Overall pt bedbound as caregiver reports pt does not like being in her wheelchair. Pt's caregiver agreeable to OT attempts to work with pt today for assessment. Pt lethargic this session, will open eyes to name call and some ROM/movements but not following commands or engaging with therapist. Despite lethargy suspect pt is likely at or close to her baseline. Pt's caregiver reports feeling comfortable continuing to provide adequate assist at home at time of discharge, has necessary DME. Questions answered and Pt currently with no further acute OT needs identified; will sign off at this time. Please re-consult if pt's needs change.     Follow Up Recommendations  No OT follow up;Supervision/Assistance - 24 hour    Equipment Recommendations  None recommended by OT(pt's DME needs are met)           Precautions / Restrictions Precautions Precautions: Other (comment);Fall Precaution Comments: Downs, dementia Restrictions Weight Bearing Restrictions: No      Mobility Bed Mobility               General bed mobility comments: deferred                                                ADL either performed or assessed with clinical judgement   ADL Overall ADL's : Needs assistance/impaired                                        General ADL Comments: totalA                         Pertinent Vitals/Pain Pain Assessment: Faces Faces Pain Scale: No hurt Pain Location: unable to state Pain Descriptors / Indicators: Grimacing;Crying Pain Intervention(s): Monitored during session;Repositioned     Hand Dominance     Extremity/Trunk Assessment Upper Extremity Assessment Upper Extremity Assessment: Generalized weakness   Lower Extremity Assessment Lower Extremity Assessment: Defer to PT evaluation       Communication Communication Communication: (pt too lethargic this session to fully determine)   Cognition Arousal/Alertness: Lethargic Behavior During Therapy: Flat affect Overall Cognitive Status: Difficult to assess                                 General Comments: pt very lethargic this session, will open eyes to name and to some movement/ROM but unable to maintain eyes open for very long. pt's caregiver reports pt does not typically follow many commands at baseline   General Comments  much of PLOF/home setup obtained from caregiver    Exercises     Shoulder Instructions  Home Living Family/patient expects to be discharged to:: Other (Comment)(lives with caregiver)                                 Additional Comments: single story home with ramped entrance, has hospital bed and WC, also have hoyer lift but patient gets scared when in lift      Prior Functioning/Environment Level of Independence: Needs assistance  Gait / Transfers Assistance Needed: totalAx2 ADL's / Homemaking Assistance Needed: totalA, caregiver reports pt requires assist for self-feeding, bed baths and incontinent requiring brief changes at bed level. caregiver reports some of this partly behavioral (refuses to self-feed, will often force herself to slide out of wheelchair because she prefers to be in bed)             OT Problem List: Decreased  strength;Decreased range of motion;Decreased cognition;Decreased activity tolerance      OT Treatment/Interventions:      OT Goals(Current goals can be found in the care plan section) Acute Rehab OT Goals Patient Stated Goal: none stated, pt's caregiver agreeable to therapist's attempts to work with pt OT Goal Formulation: Patient unable to participate in goal setting  OT Frequency:     Barriers to D/C:            Co-evaluation              AM-PAC OT "6 Clicks" Daily Activity     Outcome Measure Help from another person eating meals?: Total Help from another person taking care of personal grooming?: Total Help from another person toileting, which includes using toliet, bedpan, or urinal?: Total Help from another person bathing (including washing, rinsing, drying)?: Total Help from another person to put on and taking off regular upper body clothing?: Total Help from another person to put on and taking off regular lower body clothing?: Total 6 Click Score: 6   End of Session Nurse Communication: Mobility status  Activity Tolerance: Patient limited by lethargy Patient left: in bed;with call bell/phone within reach;with bed alarm set;with family/visitor present  OT Visit Diagnosis: Muscle weakness (generalized) (M62.81);Other symptoms and signs involving cognitive function                Time: 1207-1217 OT Time Calculation (min): 10 min Charges:  OT General Charges $OT Visit: 1 Visit OT Evaluation $OT Eval Moderate Complexity: 1 Mod  Lou Cal, OT [employer] Pager [phone] Office [phone]  Raymondo Band 02/25/2019, 2:48 PM

## 2019-02-26 DIAGNOSIS — J189 Pneumonia, unspecified organism: Secondary | ICD-10-CM

## 2019-02-26 DIAGNOSIS — R1310 Dysphagia, unspecified: Secondary | ICD-10-CM

## 2019-02-26 LAB — CBC
HCT: 40.1 % (ref 36.0–46.0)
Hemoglobin: 13.1 g/dL (ref 12.0–15.0)
MCH: 31 pg (ref 26.0–34.0)
MCHC: 32.7 g/dL (ref 30.0–36.0)
MCV: 95 fL (ref 80.0–100.0)
Platelets: 168 10*3/uL (ref 150–400)
RBC: 4.22 MIL/uL (ref 3.87–5.11)
RDW: 15.6 % — ABNORMAL HIGH (ref 11.5–15.5)
WBC: 6.3 10*3/uL (ref 4.0–10.5)
nRBC: 0 % (ref 0.0–0.2)

## 2019-02-26 LAB — BASIC METABOLIC PANEL
Anion gap: 8 (ref 5–15)
BUN: <5 mg/dL — ABNORMAL LOW (ref 8–23)
CO2: 22 mmol/L (ref 22–32)
Calcium: 7.9 mg/dL — ABNORMAL LOW (ref 8.9–10.3)
Chloride: 111 mmol/L (ref 98–111)
Creatinine, Ser: 0.66 mg/dL (ref 0.44–1.00)
GFR calc Af Amer: >60 mL/min (ref >60)
GFR calc non Af Amer: >60 mL/min (ref >60)
Glucose, Bld: 81 mg/dL (ref 70–99)
Potassium: 3 mmol/L — ABNORMAL LOW (ref 3.5–5.1)
Sodium: 141 mmol/L (ref 135–145)

## 2019-02-26 LAB — GLUCOSE, CAPILLARY
Glucose-Capillary: 100 mg/dL — ABNORMAL HIGH (ref 70–99)
Glucose-Capillary: 74 mg/dL (ref 70–99)
Glucose-Capillary: 97 mg/dL (ref 70–99)

## 2019-02-26 MED ORDER — CEFDINIR 250 MG/5ML PO SUSR: 300.0000 mg | Freq: Two times a day (BID) | ORAL | 0 refills | Status: AC

## 2019-02-26 MED ORDER — DIVALPROEX SODIUM 125 MG PO CSDR: 250.0000 mg | DELAYED_RELEASE_CAPSULE | Freq: Three times a day (TID) | ORAL | 0 refills | Status: AC

## 2019-02-26 MED ORDER — LEVOTHYROXINE SODIUM 125 MCG PO TABS: 62.5000 ug | ORAL_TABLET | Freq: Every day | ORAL | 0 refills | Status: DC

## 2019-02-26 MED ORDER — CEFDINIR 250 MG/5ML PO SUSR
300.0000 mg | Freq: Two times a day (BID) | ORAL | Status: DC
  Administered 2019-02-26: 15:00:00 300 mg via ORAL
  Filled 2019-02-26 (×2): qty 6

## 2019-02-26 MED ORDER — ENSURE ENLIVE PO LIQD
237.0000 mL | Freq: Two times a day (BID) | ORAL | Status: DC
  Administered 2019-02-26: 237 mL via ORAL

## 2019-02-26 MED ORDER — ENSURE ENLIVE PO LIQD: 237.0000 mL | Freq: Three times a day (TID) | ORAL | 12 refills | Status: AC

## 2019-02-26 NOTE — TOC Transition Note (Signed)
Transition of Care [facility]) - CM/SW Discharge Note   Patient Details  Name: Amanda Castillo MRN: [id number] Date of Birth: [birth year]  Transition of Care [facility]) CM/SW Contact:  Pollie Friar, RN Phone Number: 02/26/2019, 4:55 PM   Clinical Narrative:    Pt discharging home with [facility] services through [place]. Cory aware of d/c.  Pts AFL caregiver is to provide transport home.    Final next level of care: Home w Home Health Services Barriers to Discharge: No Barriers Identified   Patient Goals and CMS Choice   CMS Medicare.gov Compare Post Acute Care list provided to:: Patient Represenative (must comment) Choice offered to / list presented to : [place] / [place]  Discharge Placement                       Discharge Plan and Services   Discharge Planning Services: CM Consult Post Acute Care Choice: Home Health                    HH Arranged: PT, Speech Therapy, Nurse's Aide [place] Agency: [facility]     Representative spoke with at [place]: Tommi Rumps aware of d/c  Social Determinants of Health ([place]) Interventions     Readmission Risk Interventions No flowsheet data found.

## 2019-02-26 NOTE — Discharge Instructions (Signed)
It was our pleasure to take part in Amanda Castillo care.   She was found to have a pneumonia and urinary tract infection. We treated you for the pneumonia and UTI with IV antibiotics for 3 days. You will go home on oral antibiotic for 4 days including today.  We have put in orders for home health PT and SLP. We also put in orders for Ensure.   Follow up with PCP in 1 -2 weeks.

## 2019-02-26 NOTE — Progress Notes (Signed)
   Subjective: Sitting up in bed eating applesauce with the help of caregiver.  She gives the team a big smile.  Caregiver reports last night patient was very vocal, yelling out.  After receiving Depakote patient has been more calm and appears pleasant on exam this morning.   Objective:  Vital signs in last 24 hours: Vitals:   02/25/19 1944 02/25/19 2310 02/26/19 0338 02/26/19 0533  BP: (!) 112/55 (!) 112/44 118/64   Pulse: 88 79 (!) 58   Resp: 18 15 18    Temp: 98.6 F (37 C) 97.9 F (36.6 C) 98.1 F (36.7 C)   TempSrc: Oral Oral    SpO2: 93% 97% 95%   Weight:    67 kg  Height:        General: Middle aged female, resting in bed , grinding teeth. Eating with caregiver  Cardiac: RRR, no m/r/g  Abdomen: Normal bowel sound , soft   Assessment/Plan:  Principal Problem:   Acute metabolic encephalopathy Active Problems:   Down syndrome   Hypothyroidism   Dementia associated with other underlying disease with behavioral disturbance (HCC)   Lethargy   Acute cystitis   Atypical pneumonia  Amanda Castillo is a 63 - year old female with a pertinent past medical history of Down Syndrome, HLD, PVD, Stroke, and sleep apnea who presented for AMS. On presentation caretaker reports associated lethargy, cough, anorexia, and nasal congestion progressing over 2 weeks.   Caretaker reporting slow decline over the past two weeks. Decreased intake and urine output. She has been more sedated and this would put her at risk for aspiration. Resp viral panel negative. No clear picture at this point in workup, but patient started on Abx based on CT chest showing possible atypical pneumonia.  In addition UA positive for nitrites, leukocytes and many bacteria. Urine Cx grew >100,000 col/ml of E.Coli. Patient can not answer to symptoms, so will treat for UT and finish CAP coverage with Cefdinir.  #Acute encephalopathy #UTI  Patient received 3 doses of Depakote since yesterday and alert on exam. Acute  encephalopathy likely due to UTI and possible atypical pneumonia. Patient baseline dementia makes it difficult to assess for symptoms. Caretaker reports patient is back to baseline. Finished 3 days of Azithromycin and Ceftriaxone. Still continuing to require D51/2NS, will need to demonstrate she can keep up nutritional intake to stop fluids.   Appreciate SLP , CM, and OT notes.  -Cefdinir 300 mg BID Day 1/4 - Continue 5% dextrose 1/2 NS IV 75 ml/hr - Depakote sprinkles , 250 mg TID - place orders for [facility].   #Hypothyrodism TSH WNL - Continue levothyroxine    FEN: NS 75 cc/hr, replete lytes prn, NPO VTE ppx: Lovenox  Code Status: FULL    Dispo: Anticipated discharge today.   Tamsen Snider, MD PGY1  [phone]

## 2019-02-26 NOTE — Progress Notes (Signed)
  Date: 02/26/2019  Patient name: Amanda Castillo  Medical record number: [id number]  Date of birth: [birth year]   I have seen and evaluated this patient and I have discussed the plan of care with the house staff. Please see their note for complete details. I concur with their findings with the following additions/corrections:   Back to baseline per caretaker, [name]. Eating some oatmeal when we rounded. Likely AMS was related to UTI and possible PNA, will complete course of antibiotics at home. Can stop IV fluids as she has resumed oral intake. Can reassess swallowing as an outpatient after she has recovered from this illness, if still at risk for aspiration, palliative care consult would be appropriate.   Lenice Pressman, M.D., Ph.D. 02/26/2019, 3:23 PM

## 2019-02-26 NOTE — Progress Notes (Signed)
Discharged to car with assist of caregiver and caregiver's spouse.  Discharge instructions given reviewed.  No complaints.  No distress.

## 2019-02-28 LAB — CULTURE, BLOOD (SINGLE): Culture: NO GROWTH

## 2019-02-28 LAB — CULTURE, BLOOD (ROUTINE X 2)
Culture: NO GROWTH
Culture: NO GROWTH

## 2019-03-01 ENCOUNTER — Telehealth: Payer: Self-pay

## 2019-03-01 NOTE — Telephone Encounter (Signed)
Amanda Castillo with [facility] requesting VO for PT. Please call back.

## 2019-03-01 NOTE — Telephone Encounter (Signed)
VO for HH PT 1x week for 1 week 2x week for 4 weeks For strengthening, mobility, ROM, safety Given, do you agree?

## 2019-03-01 NOTE — Discharge Summary (Addendum)
Name: Amanda Castillo MRN: [id number] DOB: 02-Jul-1955 63 y.o. PCP: Dewayne Hatch, MD  Date of Admission: 02/23/2019  5:16 PM Date of Discharge: 02/26/2019 Attending Physician: Oda Kilts  Discharge Diagnosis: 1. Acute Encephalopathy   2 .Atypical Pneumonia 3. UTI   Discharge Medications: Allergies as of 02/26/2019   No Known Allergies      Medication List     STOP taking these medications    divalproex 250 MG 24 hr tablet Commonly known as: DEPAKOTE ER Replaced by: divalproex 125 MG capsule   Doxepin HCl 6 MG Tabs Commonly known as: Silenor       TAKE these medications    Aspirin Low Dose 81 MG EC tablet Generic drug: aspirin TAKE (1) TABLET BY MOUTH ONCE DAILY. What changed: See the new instructions.   Calcium 1200 1200-1000 MG-UNIT Chew Chew 1 tablet by mouth daily.   Caltrate Gummy Bites 250-400 MG-UNIT Chew Generic drug: Ca Phosphate-Cholecalciferol CHEW 1 GUMMY ONCE DAILY. What changed: See the new instructions.   cefdinir 250 MG/5ML suspension Commonly known as: OMNICEF Take 6 mLs (300 mg total) by mouth 2 (two) times daily.   divalproex 125 MG capsule Commonly known as: DEPAKOTE SPRINKLE Take 2 capsules (250 mg total) by mouth 3 (three) times daily. Replaces: divalproex 250 MG 24 hr tablet   docusate sodium 100 MG capsule Commonly known as: COLACE Take 1 capsule (100 mg total) by mouth 2 (two) times daily.   Fanapt 8 MG Tabs Generic drug: Iloperidone Take 8 mg by mouth at bedtime. What changed: Another medication with the same name was removed. Continue taking this medication, and follow the directions you see here.   feeding supplement (ENSURE ENLIVE) Liqd Take 237 mLs by mouth 3 (three) times daily between meals.   hydrocortisone cream 1 % Apply to affected area at toes 2 times daily. Discontinue when redness resolved. What changed:  how much to take how to take this when to take this reasons to take this  additional instructions   levETIRAcetam 500 MG tablet Commonly known as: KEPPRA Take 1 tablet (500 mg total) by mouth 2 (two) times daily.   levothyroxine 125 MCG tablet Commonly known as: SYNTHROID Take 0.5 tablets (62.5 mcg total) by mouth daily before breakfast.   loratadine 10 MG tablet Commonly known as: CLARITIN Take 1 tablet (10 mg total) by mouth daily.   LORazepam 1 MG tablet Commonly known as: ATIVAN Take 2 mg by mouth daily at 8 pm.   nystatin powder Commonly known as: nystatin Apply topically 4 (four) times daily. Apply on the affected area under the breast. Can discontinue when clears.   simvastatin 40 MG tablet Commonly known as: ZOCOR Take 1 tablet (40 mg total) by mouth at bedtime. At bedtime   sodium chloride 0.65 % Soln nasal spray Commonly known as: OCEAN Place 1 spray into both nostrils at bedtime as needed for congestion.   SYSTANE BALANCE OP Place 1 drop into both eyes 2 (two) times daily as needed (for dryness).   terbinafine 1 % cream Commonly known as: LAMISIL Apply 1 application topically 2 (two) times daily as needed. What changed: reasons to take this        Disposition and follow-up:   [name] was discharged from [facility] in Stable condition.  At the hospital follow up visit please address:  1.    #Acute Encephalopathy  - Patient came in with acute encephalopathy, patient's baseline made it difficult to  assess progression of symptoms , but caretaker was a good historian. Found to have UTI and Atypical Pneumonia.  - Received 3 days of Azithromycin/Ceftriaxone  - Sent home on 4 day course of Cefdinir 300 mg BID   - Patient recently started on Depakote, held for 1 day but patients response to starting antibiotics made this less likley on differential. Restarted Depakote sprinkles, for easier swallowing.  #Dysphagia  - Patient to be followed by [facility] home health.   #Hypothyrodism TSH (.58)  Continue  levothyroxine at 62.51mcg   2.  Labs / imaging needed at time of follow-up:   3.  Pending labs/ test needing follow-up:   Follow-up Appointments:    Hospital Course by problem list: #Acute encephalopathy #UTI    Caretaker reporting slow decline over the past two weeks. Decreased intake and urine output. She has been more sedated and this would put her at risk for aspiration. Resp viral panel negative. Patient started on Abx based on CT chest showing possible atypical pneumonia.In addition UA positive for nitrites, leukocytes and many bacteria. Urine Cx grew >100,000 col/ml of E.Coli. Patient can not answer to symptoms, so will treat for UTI and finish CAP coverage with cefdinir.   In addition patient started on Depakote 2 weeks ago , so initially held medication. After quick response to antibiotics this was thought to be less likely from medication. Patient was attempting to pull out her lines and screaming at night. Patient received 3 doses of Depakote since yesterday and alert on exam. Smiling and back at baseline at discharge. Could consider stopping if somnolence returns.  SLP noted dysphagia with aspiration risk, but difficult to evaluate in acute illness. Recommend re-evaluating swallowing when well, then consider palliative consult as appropriate (likely appropriate anyway given overall decline this year).   #Hypothyrodism TSH (.58)  Continue levothyroxine   Discharge Vitals:   BP (!) 111/55   Pulse (!) 47   Temp 97.7 F (36.5 C) (Oral)   Resp 18   Ht 4\' 10"  (1.473 m)   Wt 67 kg   SpO2 99%   BMI 30.87 kg/m   Pertinent Labs, Studies, and Procedures:  CBC Latest Ref Rng & Units 02/26/2019 02/26/2019 02/24/2019  WBC 4.0 - 10.5 K/uL 6.3 QUESTIONABLE RESULTS, RECOMMEND RECOLLECT TO VERIFY 5.4  Hemoglobin 12.0 - 15.0 g/dL 13.1 QUESTIONABLE RESULTS, RECOMMEND RECOLLECT TO VERIFY 13.7  Hematocrit 36.0 - 46.0 % 40.1 QUESTIONABLE RESULTS, RECOMMEND RECOLLECT TO VERIFY 41.7  Platelets  150 - 400 K/uL 168 QUESTIONABLE RESULTS, RECOMMEND RECOLLECT TO VERIFY 150   BMP Latest Ref Rng & Units 02/26/2019 02/25/2019 02/24/2019  Glucose 70 - 99 mg/dL 81 106(H) 97  BUN 8 - 23 mg/dL <5(L) <5(L) 6(L)  Creatinine 0.44 - 1.00 mg/dL 0.66 0.66 0.78  BUN/Creat Ratio 12 - 28 - - -  Sodium 135 - 145 mmol/L 141 145 142  Potassium 3.5 - 5.1 mmol/L 3.0(L) 3.1(L) 3.4(L)  Chloride 98 - 111 mmol/L 111 111 110  CO2 22 - 32 mmol/L 22 20(L) 21(L)  Calcium 8.9 - 10.3 mg/dL 7.9(L) 8.5(L) 8.1(L)   Chest Xray 1 view (portable)  9/15 FINDINGS: Suboptimal inspiration accounts for crowded bronchovascular markings diffusely and atelectasis in the bases, RIGHT greater than LEFT, and accentuates the cardiac silhouette. Taking this into account, cardiomediastinal silhouette unremarkable and unchanged. Lungs otherwise clear. Normal pulmonary vascularity. No visible pleural effusions.   IMPRESSION: Suboptimal inspiration accounts for bibasilar atelectasis, RIGHT greater than LEFT. No acute cardiopulmonary disease otherwise.  CT Chest 9/15 FINDINGS: Cardiovascular: Thoracic aorta demonstrates a normal branching pattern. No aneurysmal dilatation or dissection is seen. Pulmonary artery as visualized is within normal limits although timing was not performed for embolus evaluation. No cardiac enlargement is seen. No pericardial effusion is noted.   Mediastinum/Nodes: Esophagus as visualized is within normal limits. No sizable hilar or mediastinal adenopathy is noted. The thoracic inlet is within normal limits.   Lungs/Pleura: Lungs are well aerated bilaterally. Small bilateral pleural effusions are noted as well as mild posterior atelectatic changes. Interstitial prominence is noted right slightly greater than left these changes may be related to edema although an atypical pneumonia would deserve consideration as well. No focal confluent infiltrate is seen. No sizable parenchymal nodule is  noted.   Upper Abdomen: Fatty infiltration of the liver is seen. The gallbladder is within normal limits. The remainder of the upper abdomen is unremarkable.   Musculoskeletal: Degenerative changes of the thoracic spine are seen. No compression deformities or rib fractures are noted.   IMPRESSION: Small bilateral pleural effusions with associated atelectatic changes.   Bilateral interstitial prominence which may be related to underlying edema although atypical pneumonia deserves consideration as well.   Chronic changes as described above.   MR Brain 9/16 FINDINGS: Brain: Diffusion imaging does not show any acute or subacute infarction. Brainstem and cerebellum not show any focal insult. Mild cerebellar atrophy. 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 Both region show atrophy, encephalomalacia and gliosis. Chronic small-vessel ischemic changes affect the white matter. The ventricles are larger than were seen in 2015. This could represent progressive atrophy or a degree of normal pressure hydrocephalus.   Vascular: Major vessels at the base of the brain show flow.   Skull and upper cervical spine: Negative   Sinuses/Orbits: Clear/normal.   Other: Mastoid effusions, left larger than right.   IMPRESSION: No acute brain insult. Generalized atrophy. 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 Smaller old right parietal cortical and subcortical infarction. Ventricles are larger than were seen in 2015. This could be due to progressive atrophy, but the possibility of normal pressure hydrocephalus does exist.   Mastoid effusions left more than right, newly seen   Swallow Func Speech Pathology Pt seen in radiology for MBS. Caregiver present during this study. Pt presents with moderate oropharyngeal dysphagia and high risk of  aspiration of all consistencies.    Orally, pt exhibits decreased bolus formation and holding of puree and solid textures. Posterior spilage over tongue base noted across consistencies.  More oral residue was noted on puree.    Pharyngeally, pt exhibits trigger of the swallow reflex at the level of the vallecular sinus across consistencies. Decreased airway closure resulted in penetration during the swallow of thin, nectar, and puree consistencies, with eventual aspiration of this material. Diffuse vallecular and pyriform sinus residue contributes to aspiration risk.    Honey thick liquids and solid textures did not exhibit penetration, however, the risk is still high due to post swallow residue and oral residue. Further, pt is unable to consistently follow commands to cough, clear her throat, or swallow again.    Will begin regular solids to allow full range of solid po choices. Liquids will need to be honey thick to minimize aspiration risk. Recommend crushed meds in puree. Also recommend consideration of palliative care consult to facilitate establishment of goals of care for this patient, as continued deterioration of swallow  function is likely with advancing dementia. Results and recommendations were reviewed with pt's primary caregiver. SLP will continue to follow acutely to provide education.       Discharge Instructions: Discharge Instructions     Diet - low sodium heart healthy   Complete by: As directed    Increase activity slowly   Complete by: As directed    Increase activity slowly   Complete by: As directed        Signed:  Tamsen Snider, MD PGY1  [phone]

## 2019-03-01 NOTE — Progress Notes (Signed)
Internal Medicine Clinic Attending  Case discussed with Dr. Winfrey  at the time of the visit.  We reviewed the resident's history and exam and pertinent patient test results.  I agree with the assessment, diagnosis, and plan of care documented in the resident's note.  

## 2019-03-03 ENCOUNTER — Telehealth: Payer: Self-pay

## 2019-03-03 NOTE — Telephone Encounter (Signed)
VO for HHN eval and treat, manage disease and education of diet, Speech therapy also eval and treat for swallowing. Do you agree?

## 2019-03-03 NOTE — Telephone Encounter (Signed)
Roselie Awkward with [facility] requesting VO for nursing. Please call back.

## 2019-03-04 NOTE — Telephone Encounter (Signed)
Heather, RN with [facility] calls for VO 1 week 4 to monitor sore to right heel. She has placed hydrocolloid dressing on it and wants to monitor it for the next 3 weeks. States sore is not open. Verbal auth given. Will route to PCP for agreement/denial. Hubbard Hartshorn, RN, BSN

## 2019-03-05 ENCOUNTER — Telehealth: Payer: Self-pay | Admitting: *Deleted

## 2019-03-05 NOTE — Telephone Encounter (Signed)
Speech therapy calls and states that she did eval, could not awaken pt, she has concerns r/t swallowing and malnutrition VO for speech therapy 2x week for 1 week 1x week for 3 weeks Do you agree?

## 2019-03-08 NOTE — Telephone Encounter (Signed)
The [facility] paperwork will come by fax and will be placed in PCP's box for signature. Hubbard Hartshorn, BSN, RN-BC

## 2019-03-10 ENCOUNTER — Telehealth: Payer: Self-pay | Admitting: Internal Medicine

## 2019-03-10 NOTE — Telephone Encounter (Signed)
HHN  Calls after visit today, pt is not awake or alert. Pt did not take meds this morning, "spit" them out. Does not respond verbally or physically. Lungs are "congested and decreased breath sounds". States she is well cared for but is declining. Would like order for [facility] CSW tomorrow and after speaking with legal guardian starting hospice care. VO for [facility] CSW, do you agree?

## 2019-03-10 NOTE — Telephone Encounter (Signed)
Speech therapist for [facility]  And states pt is at great risk for aspiration, she is not alert and able to navigate swallowing most of time.  Triage has ask speech therapist to fax her notes to clinic and to have [facility] call triage today after her/his visit with pt for a more thorough assess. Speech therapist states feeding tube with comfort oral feedings may be what is best for pt.

## 2019-03-10 NOTE — Telephone Encounter (Signed)
Pt with Limited PO intake. Cyd Silence Speech therapist from  [facility]  Nurse is concerned about the patient sustaining life.  Patient is not eating enough to thrive. Please call back [phone]

## 2019-03-12 ENCOUNTER — Other Ambulatory Visit: Payer: Self-pay | Admitting: Internal Medicine

## 2019-03-12 DIAGNOSIS — J309 Allergic rhinitis, unspecified: Secondary | ICD-10-CM

## 2019-03-15 NOTE — Telephone Encounter (Signed)
Call placed to Janice Norrie, [place] with [facility]. States patient's sister and care giver are in agreement with hospice.   Following is from patient's PCP from 03/11/2019:  "Agree with that if patient's POA decided to do so. And will need evaluation by hospic team though"  Hilda Blades will contact [facility] for referral to Hospice. Hubbard Hartshorn, BSN, RN-BC

## 2019-03-15 NOTE — Telephone Encounter (Signed)
Rec'd call from Janice Norrie Social Worker from  [facility].  Pt seen on 03/11/2019 and spoke with the family and the family is in agreement with hospice. Also SW reporting pt eaten very little over the weekend. SW requesting a call back as soon as possible.

## 2019-03-16 ENCOUNTER — Telehealth: Payer: Self-pay | Admitting: Internal Medicine

## 2019-03-16 NOTE — Telephone Encounter (Signed)
rtc to caregiver earlier today, she stated hospice will be starting care later this week for pt. And ask about refills on nistatin powder and cream. Pt has refills

## 2019-03-16 NOTE — Telephone Encounter (Signed)
Pls contact the caregiver of patient Amanda Castillo [phone]

## 2019-03-17 ENCOUNTER — Ambulatory Visit: Payer: Medicare Other

## 2019-03-17 ENCOUNTER — Telehealth: Payer: Self-pay

## 2019-03-17 NOTE — Telephone Encounter (Signed)
Pt's caregiver requesting to speak with a nurse about pt's incontinence supplies. States they need an update on the supplies. Please call back.

## 2019-03-18 ENCOUNTER — Telehealth: Payer: Self-pay | Admitting: *Deleted

## 2019-03-18 NOTE — Telephone Encounter (Signed)
Call from [facility], [place] - stated pt is under Hospice care and they will discontinue their services and Hospice will provide any PT care. He want to inform pt's doctor.

## 2019-03-19 ENCOUNTER — Other Ambulatory Visit: Payer: Self-pay | Admitting: Internal Medicine

## 2019-03-19 DIAGNOSIS — J309 Allergic rhinitis, unspecified: Secondary | ICD-10-CM

## 2019-03-19 NOTE — Telephone Encounter (Signed)
Returned call to patient's caregiver. States that everything has been taken care of. Patient now receiving hospice care and incontinence supplies are handled through them. Hubbard Hartshorn, BSN, RN-BC

## 2019-03-19 NOTE — Telephone Encounter (Signed)
Appropriate to refill. How ever, reported that she does not have oral intake and I think hospice team is taking care of her meds now. Isn't it the case?  Please let me know. Thanks

## 2019-03-22 ENCOUNTER — Other Ambulatory Visit: Payer: Self-pay

## 2019-03-22 NOTE — Telephone Encounter (Signed)
Caregiver reports pt is much better, she has started swallowing again. She is on 02 at 5l/min and seems improved. 02 sats are low, in 80's but she is swallowing. Will await call from hospice wed am after their next visit w/ pt before asking for refill of loratidine.

## 2019-03-22 NOTE — Telephone Encounter (Signed)
Amanda Castillo with the pharmacy requesting to speak with a nurse about   loratadine (CLARITIN) 10 MG tablet   Please call back.

## 2019-03-22 NOTE — Telephone Encounter (Signed)
Spoke w/ pharm, referred them to pt's caregiver and informed them pt is in hospice care now

## 2019-03-24 NOTE — Telephone Encounter (Signed)
meagan from hospice calls to say pt is greatly improved last week 02 sats were in 60-70 this week after starting 02 last week 02 sat low 90's. Pt is eating and drinking. She will keep imc informed weekly

## 2019-04-12 ENCOUNTER — Other Ambulatory Visit: Payer: Self-pay | Admitting: Internal Medicine

## 2019-04-12 DIAGNOSIS — E785 Hyperlipidemia, unspecified: Secondary | ICD-10-CM

## 2019-04-13 ENCOUNTER — Other Ambulatory Visit: Payer: Self-pay | Admitting: Internal Medicine

## 2019-04-13 DIAGNOSIS — B372 Candidiasis of skin and nail: Secondary | ICD-10-CM

## 2019-04-22 ENCOUNTER — Telehealth: Payer: Self-pay | Admitting: *Deleted

## 2019-05-11 NOTE — Telephone Encounter (Signed)
Makayla, RN with [facility] called to relay patient died today at 37. Makayla and care givers were with patient at the time. Hubbard Hartshorn, BSN, RN-BC

## 2019-05-11 NOTE — Telephone Encounter (Signed)
Thank you. Unfortunate, but she certainly had been declining for some time and hospice was involved.

## 2019-05-11 DEATH — deceased
